# Patient Record
Sex: Female | Born: 1975 | Race: Black or African American | Hispanic: No | Marital: Single | Smoking: Former smoker
Health system: Southern US, Community
[De-identification: ages and names within clinical notes are randomized; demographics above are authoritative.]

## PROBLEM LIST (undated history)

## (undated) DIAGNOSIS — D1803 Hemangioma of intra-abdominal structures: Secondary | ICD-10-CM

## (undated) DIAGNOSIS — C250 Malignant neoplasm of head of pancreas: Secondary | ICD-10-CM

## (undated) DIAGNOSIS — K219 Gastro-esophageal reflux disease without esophagitis: Secondary | ICD-10-CM

## (undated) DIAGNOSIS — R51 Headache: Secondary | ICD-10-CM

## (undated) DIAGNOSIS — B1910 Unspecified viral hepatitis B without hepatic coma: Secondary | ICD-10-CM

## (undated) DIAGNOSIS — S82899A Other fracture of unspecified lower leg, initial encounter for closed fracture: Secondary | ICD-10-CM

## (undated) DIAGNOSIS — L309 Dermatitis, unspecified: Secondary | ICD-10-CM

## (undated) DIAGNOSIS — K869 Disease of pancreas, unspecified: Secondary | ICD-10-CM

## (undated) HISTORY — PX: ANKLE SURGERY: SHX546

## (undated) HISTORY — DX: Morbid (severe) obesity due to excess calories: E66.01

## (undated) HISTORY — PX: MOUTH SURGERY: SHX715

## (undated) HISTORY — PX: INDUCED ABORTION: SHX677

## (undated) HISTORY — DX: Headache: R51

## (undated) HISTORY — DX: Dermatitis, unspecified: L30.9

## (undated) HISTORY — PX: TUBAL LIGATION: SHX77

## (undated) HISTORY — DX: Hemangioma of intra-abdominal structures: D18.03

## (undated) HISTORY — DX: Unspecified viral hepatitis B without hepatic coma: B19.10

## (undated) HISTORY — DX: Disease of pancreas, unspecified: K86.9

## (undated) HISTORY — DX: Gastro-esophageal reflux disease without esophagitis: K21.9

---

## 1997-09-28 ENCOUNTER — Inpatient Hospital Stay (HOSPITAL_COMMUNITY): Admission: AD | Admit: 1997-09-28 | Discharge: 1997-09-28 | Payer: Self-pay | Admitting: Obstetrics & Gynecology

## 1999-04-26 ENCOUNTER — Ambulatory Visit (HOSPITAL_COMMUNITY): Admission: RE | Admit: 1999-04-26 | Discharge: 1999-04-26 | Payer: Self-pay | Admitting: *Deleted

## 1999-05-31 ENCOUNTER — Inpatient Hospital Stay (HOSPITAL_COMMUNITY): Admission: AD | Admit: 1999-05-31 | Discharge: 1999-05-31 | Payer: Self-pay | Admitting: *Deleted

## 1999-12-10 ENCOUNTER — Inpatient Hospital Stay (HOSPITAL_COMMUNITY): Admission: AD | Admit: 1999-12-10 | Discharge: 1999-12-10 | Payer: Self-pay | Admitting: Obstetrics

## 1999-12-10 ENCOUNTER — Encounter: Payer: Self-pay | Admitting: Obstetrics

## 2000-01-20 ENCOUNTER — Ambulatory Visit (HOSPITAL_COMMUNITY): Admission: RE | Admit: 2000-01-20 | Discharge: 2000-01-20 | Payer: Self-pay | Admitting: *Deleted

## 2000-05-20 ENCOUNTER — Encounter (INDEPENDENT_AMBULATORY_CARE_PROVIDER_SITE_OTHER): Payer: Self-pay | Admitting: Specialist

## 2000-05-20 ENCOUNTER — Inpatient Hospital Stay (HOSPITAL_COMMUNITY): Admission: AD | Admit: 2000-05-20 | Discharge: 2000-05-22 | Payer: Self-pay | Admitting: Obstetrics

## 2003-04-15 ENCOUNTER — Emergency Department (HOSPITAL_COMMUNITY): Admission: EM | Admit: 2003-04-15 | Discharge: 2003-04-15 | Payer: Self-pay | Admitting: Emergency Medicine

## 2003-04-19 ENCOUNTER — Emergency Department (HOSPITAL_COMMUNITY): Admission: EM | Admit: 2003-04-19 | Discharge: 2003-04-19 | Payer: Self-pay | Admitting: Emergency Medicine

## 2003-06-30 ENCOUNTER — Inpatient Hospital Stay (HOSPITAL_COMMUNITY): Admission: AD | Admit: 2003-06-30 | Discharge: 2003-06-30 | Payer: Self-pay | Admitting: *Deleted

## 2006-04-12 ENCOUNTER — Emergency Department (HOSPITAL_COMMUNITY): Admission: EM | Admit: 2006-04-12 | Discharge: 2006-04-12 | Payer: Self-pay | Admitting: Emergency Medicine

## 2006-04-16 ENCOUNTER — Emergency Department (HOSPITAL_COMMUNITY): Admission: EM | Admit: 2006-04-16 | Discharge: 2006-04-16 | Payer: Self-pay | Admitting: Emergency Medicine

## 2006-05-03 ENCOUNTER — Other Ambulatory Visit: Admission: RE | Admit: 2006-05-03 | Discharge: 2006-05-03 | Payer: Self-pay | Admitting: Family Medicine

## 2007-04-29 ENCOUNTER — Emergency Department (HOSPITAL_COMMUNITY): Admission: EM | Admit: 2007-04-29 | Discharge: 2007-04-29 | Payer: Self-pay | Admitting: Emergency Medicine

## 2007-12-01 ENCOUNTER — Emergency Department (HOSPITAL_COMMUNITY): Admission: EM | Admit: 2007-12-01 | Discharge: 2007-12-01 | Payer: Self-pay | Admitting: Emergency Medicine

## 2008-01-02 ENCOUNTER — Inpatient Hospital Stay (HOSPITAL_COMMUNITY): Admission: EM | Admit: 2008-01-02 | Discharge: 2008-01-03 | Payer: Self-pay | Admitting: Emergency Medicine

## 2009-10-04 ENCOUNTER — Inpatient Hospital Stay (HOSPITAL_COMMUNITY): Admission: AD | Admit: 2009-10-04 | Discharge: 2009-10-04 | Payer: Self-pay | Admitting: Obstetrics & Gynecology

## 2009-10-04 ENCOUNTER — Ambulatory Visit: Payer: Self-pay | Admitting: Family

## 2010-09-06 LAB — WET PREP, GENITAL
Clue Cells Wet Prep HPF POC: NONE SEEN
Yeast Wet Prep HPF POC: NONE SEEN

## 2010-09-06 LAB — POCT PREGNANCY, URINE: Preg Test, Ur: NEGATIVE

## 2010-11-01 NOTE — Op Note (Signed)
NAMEMarland Kitchen  ESTEEN, Kathryn NO.:  [id number]   MEDICAL RECORD NO.:  [id number]          PATIENT TYPE:  INP   LOCATION:  0102                         FACILITY:  [facility]   PHYSICIAN:  Vania Rea. Supple, M.D.  DATE OF BIRTH:  1975-08-18   DATE OF PROCEDURE:  01/02/2008  DATE OF DISCHARGE:                               OPERATIVE REPORT   PREOPERATIVE DIAGNOSIS:  Displaced left ankle bimalleolar fracture  dislocation.   POSTOPERATIVE DIAGNOSIS:  Displaced left ankle bimalleolar fracture  dislocation.   PROCEDURE:  Open reduction and internal fixation of left bimalleolar  ankle fracture.   SURGEON:  Vania Rea. Supple, M.D.   Threasa HeadsFrench Ana A. Shuford, P.A.-C.   ANESTHESIA:  LMA general.   TOURNIQUET TIME:  Approximately 1 hour 50 minutes.   BLOOD LOSS:  Minimal.   DRAINS:  None.   HISTORY:  Kathryn Stevenson is a 35 year old female who tripped and fell  injuring her left ankle with complaints of immediate ankle pain and  inability to bear weight.  She was evaluated in the [facility]  Emergency Room with radiographs reviewed, showing displaced bimalleolar  ankle fracture.  Patient was subsequently brought to the operating room  at this time for planned ORIF.   Preoperatively counseled [name] on treatment options as well as  risks versus benefits thereof.  Possible surgical complications of  bleeding, infection, neurovascular injury, DVT, PE, arthrofibrosis,  malunion, nonunion, loss of fixation,  possible need for additional  surgery are reviewed.  She understands and accepts and agrees with the  planned procedure.   DESCRIPTION OF PROCEDURE:  After undergoing a routine preop evaluation,  the patient received prophylactic antibiotics.  Placed supine on the  operating table and underwent smooth induction of general endotracheal  anesthesia.  Tourniquet applied to the left thigh and left leg was  sterilely prepped and draped in standard fashion.  Leg was  exsanguinated  with the tourniquet inflated to 400 mmHg.  Made a anterior hockey-stick  incision about the medial malleolus.  Dissection carried deeply with the  medial malleolar fracture site exposed and irrigated.  Inspection of the  talar dome showed significant chondral defect which was debrided.  Interposed soft tissue hematoma clot was all removed.  We then turned  our attention laterally, made a longitudinal 10 cm incision over the  distal fibula with dissection carried deeply with peroneal musculature  and tendon reflected posteriorly and subperiosteal dissection used to  expose the distal fibula about the fracture site.  Interposed soft  tissues were removed and the fracture site was irrigated and  meticulously cleaned and then reduced under direct visualization and  temporarily held with a clamp.  We then contoured a 7-hole one-third  tubular locking plate to fit over the posterolateral margin of the  distal fibula spanning the fracture site and this was then transfixed  with a standard cortical screw through the plate initially, a lag screw  across the fracture site, two locking screws distally, two locking  screws proximally which all obtained excellent bony purchase and good  alignment of the fracture site.  Fluoroscopic images were then used to  confirm good alignment of the fracture site.  We then returned our  attention medially where under direct visualization reduced the medial  malleolar fracture and past two guide wires for the 4 cannulated screws  and this, again, was confirmed fluoroscopically to be good position.  We  went ahead and drilled placed two 45 mm cannulated lag screws with  washers across the fracture site and again obtained excellent bony  purchase and good compression across the fracture site.  At this point,  fluoroscopic images were then obtained which confirmed good position of  the hardware and good alignment of the fracture sites.  The tibial   plafond was symmetrically aligned.  All wounds were then irrigated.  Closed with 2-0 Vicryl for the deep subcu and intracuticular 3-0  Monocryl for the skin, followed by Steri-Strips.  Bulky dry dressing was  then wrapped about the ankle and leg and a very well-padded short-leg  plaster stirrup.  Splint was applied with the ankle in neutral position.  The tourniquet was able to be let down.  The patient was extubated and  taken to the recovery room in stable condition.      Vania Rea. Supple, M.D.  Electronically Signed     KMS/MEDQ  D:  01/02/2008  T:  01/02/2008  Job:  [id number]

## 2010-11-04 NOTE — Op Note (Signed)
[facility]  Patient:    Kathryn Stevenson, Kathryn Stevenson                      MRN: [id number] Proc. Date: 05/21/00 Adm. Date:  [id number] Attending:  Tammi Sou Dictator:   Jamey Reas, M.D.                           Operative Report  DATE OF BIRTH:                Aug 18, 1975.  PREOPERATIVE DIAGNOSIS:       Multiparity, desires permanent sterilization.  POSTOPERATIVE DIAGNOSIS:      Multiparity, desires permanent sterilization.  OPERATION:                    Postpartum tubal ligation, Pomeroy method.  SURGEON:                      Dr. Coral Ceo.  ASSISTANT:                    Dr. Larita Fife.  ANESTHESIA:                   General.  COMPLICATIONS:                None.  ESTIMATED BLOOD LOSS:         Minimal.  FLUID:                        1400 cc.  INDICATIONS:                  [age]-year-old G4, P2-0-2-2, status post NSVD, who desires permanent sterilization.  Risks and benefits of procedure discussed with patient including risk of failure of 3-5:1000, with increased risk of ectopic gestation if pregnancy occurs.  FINDINGS:                     Normal uterus, tubes, and ovaries.  DESCRIPTION OF PROCEDURE:     The patient was taken to the operating room, where general anesthesia was introduced.  A small transverse infraumbilical skin incision was then made with the scalpel.  The incision was carried down through the underlying fascia until the peritoneum was identified and entered. The peritoneum was noted to be free of any adhesions, and the incision was then extended with Metzenbaum scissors.  The patients right fallopian tube was then identified, brought through the incision, and grasped with the Babcock clamp.  The tube was then followed out to the fimbria.  A Babcock clamp was then used to grasp the tube approximately 4 cm from the corneal region.  A 3 cm segment of the tube was then ligated with two free ties  of plain gut and excised.  Good hemostasis was noted, and the tube was returned to the abdomen.  The left fallopian tube was then ligated x 2, a 3 cm segment excised in a similar fashion.  Excellent hemostasis was noted, and the tube was returned to the abdomen.  The peritoneum and fascia were then closed with a single layer using 3-0 Vicryl.  The skin was closed with three subcutaneous Monocryl interrupted sutures to approximate the subcutaneous tissue.  The skin was then closed in a subcuticular fashion using 4-0 Monocryl stitch.  The patient tolerated the procedure well.  Sponge, lap, and needle counts were correct x 2.  The patient was taken to the recovery room in stable condition. Pathology segments were right and left fallopian tubes. DD:  05/21/00 TD:  05/21/00 Job: [id number]

## 2011-03-17 LAB — URINALYSIS, ROUTINE W REFLEX MICROSCOPIC
Bilirubin Urine: NEGATIVE
Glucose, UA: NEGATIVE
Hgb urine dipstick: NEGATIVE
Ketones, ur: NEGATIVE
Nitrite: NEGATIVE
Protein, ur: NEGATIVE
Specific Gravity, Urine: 1.006
Urobilinogen, UA: 0.2
pH: 6.5

## 2011-03-17 LAB — COMPREHENSIVE METABOLIC PANEL
ALT: 21
AST: 26
Albumin: 3.7
Alkaline Phosphatase: 75
BUN: 9
CO2: 19
Calcium: 8.8
Chloride: 109
Creatinine, Ser: 0.87
GFR calc Af Amer: >60
GFR calc non Af Amer: >60
Glucose, Bld: 89
Potassium: 4.5
Sodium: 140
Total Bilirubin: 1.1
Total Protein: 6.9

## 2011-03-17 LAB — DIFFERENTIAL
Basophils Absolute: 0
Basophils Relative: 0
Eosinophils Absolute: 0
Eosinophils Relative: 0
Lymphocytes Relative: 24
Lymphs Abs: 2.8
Monocytes Absolute: 0.4
Monocytes Relative: 4
Neutro Abs: 8.2 — ABNORMAL HIGH
Neutrophils Relative %: 72

## 2011-03-17 LAB — CBC
HCT: 39.8
Hemoglobin: 14
MCHC: 35.2
MCV: 90.6
Platelets: 275
RBC: 4.4
RDW: 13.6
WBC: 11.5 — ABNORMAL HIGH

## 2011-03-17 LAB — PREGNANCY, URINE: Preg Test, Ur: NEGATIVE

## 2011-10-18 ENCOUNTER — Inpatient Hospital Stay (HOSPITAL_COMMUNITY)
Admission: AD | Admit: 2011-10-18 | Discharge: 2011-10-18 | Disposition: A | Discharge status: Home / Self Care | Payer: Medicaid Other | Source: Ambulatory Visit | Attending: Obstetrics and Gynecology | Admitting: Obstetrics and Gynecology

## 2011-10-18 DIAGNOSIS — M549 Dorsalgia, unspecified: Secondary | ICD-10-CM

## 2011-10-18 DIAGNOSIS — L259 Unspecified contact dermatitis, unspecified cause: Secondary | ICD-10-CM | POA: Insufficient documentation

## 2011-10-18 LAB — URINALYSIS, ROUTINE W REFLEX MICROSCOPIC
Bilirubin Urine: NEGATIVE
Glucose, UA: NEGATIVE mg/dL
Ketones, ur: NEGATIVE mg/dL
Nitrite: NEGATIVE
Protein, ur: NEGATIVE mg/dL
Specific Gravity, Urine: 1.015 (ref 1.005–1.030)
Urobilinogen, UA: 0.2 mg/dL (ref 0.0–1.0)
pH: 6 (ref 5.0–8.0)

## 2011-10-18 LAB — URINE MICROSCOPIC-ADD ON

## 2011-10-18 LAB — POCT PREGNANCY, URINE
Preg Test, Ur: NEGATIVE
Preg Test, Ur: NEGATIVE

## 2011-10-18 MED ORDER — BACLOFEN 10 MG PO TABS: 10.0000 mg | ORAL_TABLET | Freq: Three times a day (TID) | ORAL | Status: DC

## 2011-10-18 NOTE — MAU Note (Signed)
C/o R sided back pain for app one month, slightly worse with lying down. Also c/o sudden onset "spots" on trunk and extremities. These bumps are hive-like in appearance and sparsel on arms and legs. C/o very itchy and mild relief from hydrocortisone cream.

## 2011-10-18 NOTE — Discharge Instructions (Signed)
Back Exercises Back exercises help treat and prevent back injuries. The goal is to increase your strength in your belly (abdominal) and back muscles. These exercises can also help with flexibility. Start these exercises when told by your doctor. HOME CARE Back exercises include: Pelvic Tilt.  Lie on your back with your knees bent. Tilt your pelvis until the lower part of your back is against the floor. Hold this position 5 to 10 sec. Repeat this exercise 5 to 10 times.  Knee to Chest.  Pull 1 knee up against your chest and hold for 20 to 30 seconds. Repeat this with the other knee. This may be done with the other leg straight or bent, whichever feels better. Then, pull both knees up against your chest.  Sit-Ups or Curl-Ups.  Bend your knees 90 degrees. Start with tilting your pelvis, and do a partial, slow sit-up. Only lift your upper half 30 to 45 degrees off the floor. Take at least 2 to 3 seonds for each sit-up. Do not do sit-ups with your knees out straight. If partial sit-ups are difficult, simply do the above but with only tightening your belly (abdominal) muscles and holding it as told.  Hip-Lift.  Lie on your back with your knees flexed 90 degrees. Push down with your feet and shoulders as you raise your hips 2 inches off the floor. Hold for 10 seconds, repeat 5 to 10 times.  Back Arches.  Lie on your stomach. Prop yourself up on bent elbows. Slowly press on your hands, causing an arch in your low back. Repeat 3 to 5 times.  Shoulder-Lifts.  Lie face down with arms beside your body. Keep hips and belly pressed to floor as you slowly lift your head and shoulders off the floor.  Do not overdo your exercises. Be careful in the beginning. Exercises may cause you some mild back discomfort. If the pain lasts for more than 15 minutes, stop the exercises until you see your doctor. Improvement with exercise for back problems is slow.  Document Released: 07/08/2010 Document Revised: 05/25/2011  Document Reviewed: 07/08/2010 [employer] Patient Information 2012 [employer]. 

## 2011-10-18 NOTE — MAU Provider Note (Signed)
  History     CSN: [id number]  Arrival date and time: 10/18/11 2049   None     Chief Complaint  Patient presents with  . Rash  . Back Pain   HPI This is a 36 year old female seen for 3 days of diffuse red raised pruritic areas all over skin.  Woke up Monday morning with these lesions after hanging sheets out on line the day before.  Has been taking benadryl with relief of symptoms, although itching comes back after benadryl wears off.  No changes in soaps, lotions, foods.  Also having back pain that has been worsening over past 1 month.  Worse when laying in bed, especially when wakes up in morning.  No accidents, falls, strains.  OB History    No data available      No past medical history on file.  No past surgical history on file.  No family history on file.  History  Substance Use Topics  . Smoking status: Not on file  . Smokeless tobacco: Not on file  . Alcohol Use: Not on file    Allergies: No Known Allergies  Prescriptions prior to admission  Medication Sig Dispense Refill  . b complex vitamins tablet Take 1 tablet by mouth daily.      . diphenhydrAMINE (BENADRYL) 25 MG tablet Take 25 mg by mouth every 6 (six) hours as needed. For hives        ROS Physical Exam   Blood pressure 124/74, pulse 80, temperature 97.8 F (36.6 C), temperature source Oral, resp. rate 20, height 5\' 6"  (1.676 m), weight 143.972 kg (317 lb 6.4 oz), last menstrual period 10/05/2011.  Physical Exam  Constitutional: She is oriented to person, place, and time. She appears well-developed and well-nourished.  GI: Soft. Bowel sounds are normal. She exhibits no distension and no mass. There is no tenderness. There is no rebound and no guarding.  Musculoskeletal:       Right QL spasm.  Neurological: She is alert and oriented to person, place, and time.       LE muscles 5/5.    Skin: Skin is warm and dry.       Scattered hives throughout.  Psychiatric: She has a normal mood and affect. Her  behavior is normal. Judgment and thought content normal.    MAU Course  Procedures   Assessment and Plan  1.  Contact dermatitis.  Encouraged patient to wash and dry sheet again.  Likely from sheets.  Continue benadryl q6hrs prn. 2.  Back pain - baclofen.  Elleigh Cassetta JEHIEL 10/18/2011, 9:56 PM

## 2011-10-18 NOTE — MAU Note (Signed)
Pt LMP 10/05/2011, having lower back pain x 1.57months.  Denies vag bleeding or discharge.  Noticed small itchy bumps on arms, abd and legs Monday morning.

## 2011-11-12 ENCOUNTER — Encounter (HOSPITAL_COMMUNITY): Payer: Self-pay | Admitting: *Deleted

## 2011-11-12 ENCOUNTER — Emergency Department (HOSPITAL_COMMUNITY)
Admission: EM | Admit: 2011-11-12 | Discharge: 2011-11-12 | Disposition: A | Discharge status: Home / Self Care | Payer: Medicaid Other | Attending: Emergency Medicine | Admitting: Emergency Medicine

## 2011-11-12 DIAGNOSIS — L0390 Cellulitis, unspecified: Secondary | ICD-10-CM

## 2011-11-12 DIAGNOSIS — IMO0002 Reserved for concepts with insufficient information to code with codable children: Secondary | ICD-10-CM | POA: Insufficient documentation

## 2011-11-12 DIAGNOSIS — L0291 Cutaneous abscess, unspecified: Secondary | ICD-10-CM

## 2011-11-12 MED ORDER — OXYCODONE-ACETAMINOPHEN 5-325 MG PO TABS
1.0000 | ORAL_TABLET | Freq: Once | ORAL | Status: AC
  Administered 2011-11-12: 1 via ORAL
  Filled 2011-11-12: qty 1

## 2011-11-12 MED ORDER — OXYCODONE-ACETAMINOPHEN 5-325 MG PO TABS: 1.0000 | ORAL_TABLET | ORAL | Status: AC | PRN

## 2011-11-12 MED ORDER — LIDOCAINE HCL 2 % IJ SOLN
10.0000 mL | Freq: Once | INTRAMUSCULAR | Status: AC
  Administered 2011-11-12: 20 mg via INTRADERMAL
  Filled 2011-11-12: qty 1

## 2011-11-12 MED ORDER — SULFAMETHOXAZOLE-TRIMETHOPRIM 800-160 MG PO TABS: 1.0000 | ORAL_TABLET | Freq: Two times a day (BID) | ORAL | Status: AC

## 2011-11-12 MED ORDER — IBUPROFEN 800 MG PO TABS
800.0000 mg | ORAL_TABLET | Freq: Once | ORAL | Status: AC
  Administered 2011-11-12: 800 mg via ORAL
  Filled 2011-11-12: qty 1

## 2011-11-12 NOTE — ED Notes (Signed)
Reports abscess started week ago progressively worsening & spreading. Denies drainage. States has had it "lanced" approx 2 yrs ago.

## 2011-11-12 NOTE — Discharge Instructions (Signed)
Kathryn Stevenson we drained an abscess to your L axilla.  Return in 2 days for packing removal and recheck.  Start the antibiotics today.  Use hot compresses and squeeze 3 times a day.  Take ibuprofen 800mg  every 6 hours with food.  Take the percocet for severe pain but do not drive with this medication.  Return to the ER for severe pain, fever nausea and vomiting.    Abscess An abscess (boil or furuncle) is an infected area under your skin. This area is filled with yellowish white fluid (pus). HOME CARE   Only take medicine as told by your doctor.   Keep the skin clean around your abscess. Keep clothes that may touch the abscess clean.   Change any bandages (dressings) as told by your doctor.   Avoid direct skin contact with other people. The infection can spread by skin contact with others.   Practice good hygiene and do not share personal care items.   Do not share athletic equipment, towels, or whirlpools. Shower after every practice or work out session.   If a draining area cannot be covered:   Do not play sports.   Children should not go to daycare until the wound has healed or until fluid (drainage) stops coming out of the wound.   See your doctor for a follow-up visit as told.  GET HELP RIGHT AWAY IF:   There is more pain, puffiness (swelling), and redness in the wound site.   There is fluid or bleeding from the wound site.   You have muscle aches, chills, fever, or feel sick.   You or your child has a temperature by mouth above 102 F (38.9 C), not controlled by medicine.   Your baby is older than 3 months with a rectal temperature of 102 F (38.9 C) or higher.  MAKE SURE YOU:   Understand these instructions.   Will watch your condition.   Will get help right away if you are not doing well or get worse.  Document Released: 11/22/2007 Document Revised: 05/25/2011 Document Reviewed: 11/22/2007 [facility] [employer] Patient Information 2012 [place], [place].Cellulitis Cellulitis is an  infection of the tissue under the skin. The infected area is usually red and tender. This is caused by germs. These germs enter the body through cuts or sores. This usually happens in the arms or lower legs. HOME CARE   Take your medicine as told. Finish it even if you start to feel better.   If the infection is on the arm or leg, keep it raised (elevated).   Use a warm cloth on the infected area several times a day.   See your doctor for a follow-up visit as told.  GET HELP RIGHT AWAY IF:   You are tired or confused.   You throw up (vomit).   You have watery poop (diarrhea).   You feel ill and have muscle aches.   You have a fever.  MAKE SURE YOU:   Understand these instructions.   Will watch your condition.   Will get help right away if you are not doing well or get worse.  Document Released: 11/22/2007 Document Revised: 05/25/2011 Document Reviewed: 05/07/2009 [facility] Patient Information 2012 [place], [place].

## 2011-11-12 NOTE — ED Notes (Signed)
Pt has abscess under left arm. No other complaints.

## 2011-11-12 NOTE — ED Provider Notes (Signed)
History     CSN: [id number]  Arrival date & time 11/12/11  1052   First MD Initiated Contact with Patient 11/12/11 1103      Chief Complaint  Patient presents with  . Abscess    (Consider location/radiation/quality/duration/timing/severity/associated sxs/prior treatment) Patient is a 36 y.o. female presenting with abscess. The history is provided by the patient. No language interpreter was used.  Abscess  This is a recurrent problem. The current episode started less than one week ago. The problem occurs frequently. The problem has been unchanged. Affected Location: L axilla. The problem is moderate. The abscess is characterized by redness, painfulness and swelling. Pertinent negatives include no fever and no vomiting.  Recurrent Abscess to L axillary.  Surrounding cellulitis.   History reviewed. No pertinent past medical history.  History reviewed. No pertinent past surgical history.  History reviewed. No pertinent family history.  History  Substance Use Topics  . Smoking status: Current Everyday Smoker    Types: Cigarettes  . Smokeless tobacco: Not on file  . Alcohol Use: No    OB History    Grav Para Term Preterm Abortions TAB SAB Ect Mult Living                  Review of Systems  Constitutional: Negative for fever and diaphoresis.  Respiratory: Negative for shortness of breath.   Gastrointestinal: Negative for nausea and vomiting.  Skin:       L axilla abscess  Neurological: Negative for dizziness, weakness and light-headedness.    Allergies  Review of patient's allergies indicates no known allergies.  Home Medications   Current Outpatient Rx  Name Route Sig Dispense Refill  . IBUPROFEN 200 MG PO TABS Oral Take 200-400 mg by mouth every 6 (six) hours as needed. For menstrual cramps    . BACLOFEN 10 MG PO TABS Oral Take 1 tablet (10 mg total) by mouth 3 (three) times daily. 30 each 0    BP 139/84  Pulse 91  Temp 98.3 F (36.8 C)  Resp 20  SpO2 99%   LMP 11/09/2011  Physical Exam  Constitutional: She is oriented to person, place, and time. She appears well-developed and well-nourished. No distress.  HENT:  Head: Normocephalic.  Eyes: Pupils are equal, round, and reactive to light.  Neck: Normal range of motion.  Cardiovascular: Normal rate.   Pulmonary/Chest: Effort normal.  Neurological: She is alert and oriented to person, place, and time.  Skin: Skin is warm and dry.       6cm L axilla abscess with surrounding cellulitis  Psychiatric: She has a normal mood and affect.    ED Course  INCISION AND DRAINAGE Date/Time: 11/12/2011 11:15 AM Performed by: Remi Haggard Authorized by: Remi Haggard Consent: Verbal consent obtained. Written consent not obtained. Risks and benefits: risks, benefits and alternatives were discussed Consent given by: patient Patient understanding: patient states understanding of the procedure being performed Patient identity confirmed: verbally with patient, arm band, provided demographic data and hospital-assigned identification number Type: abscess Body area: trunk (L axilla) Anesthesia: local infiltration Local anesthetic: lidocaine 2% without epinephrine Patient sedated: no Scalpel size: 11 Needle gauge: 22 Incision type: single straight Complexity: simple Drainage: purulent and serosanguinous Drainage amount: copious Wound treatment: wound left open Packing material: 1/4 in iodoform gauze Patient tolerance: Patient tolerated the procedure well with no immediate complications.   (including critical care time)  Labs Reviewed - No data to display No results found.   No diagnosis found.  MDM  I & D L axillary abscess with cellulitis.  Pt put on Bactrim DS.  Return in 2 days for recheck.          Remi Haggard, NP 11/13/11 [phone]

## 2011-11-12 NOTE — Progress Notes (Signed)
Cm spoke with PA concerning pt's need for medication assistance. Pt eligible for indigent funds. Rx sent to pharmacy. Pt states visits [facility] for primary care.   Leonie Green [phone]

## 2011-11-14 ENCOUNTER — Emergency Department (HOSPITAL_COMMUNITY)
Admission: EM | Admit: 2011-11-14 | Discharge: 2011-11-14 | Disposition: A | Discharge status: Home / Self Care | Payer: Medicaid Other | Attending: Emergency Medicine | Admitting: Emergency Medicine

## 2011-11-14 ENCOUNTER — Encounter (HOSPITAL_COMMUNITY): Payer: Self-pay | Admitting: *Deleted

## 2011-11-14 DIAGNOSIS — Z5189 Encounter for other specified aftercare: Secondary | ICD-10-CM

## 2011-11-14 DIAGNOSIS — F17200 Nicotine dependence, unspecified, uncomplicated: Secondary | ICD-10-CM | POA: Insufficient documentation

## 2011-11-14 DIAGNOSIS — IMO0002 Reserved for concepts with insufficient information to code with codable children: Secondary | ICD-10-CM | POA: Insufficient documentation

## 2011-11-14 DIAGNOSIS — Z4800 Encounter for change or removal of nonsurgical wound dressing: Secondary | ICD-10-CM | POA: Insufficient documentation

## 2011-11-14 NOTE — ED Provider Notes (Signed)
History  Scribed for Suzi Roots, MD, the patient was seen in room STRE3/STRE3. This chart was scribed by Candelaria Stagers. The patient's care started at 11:43 AM    CSN: [id number]  Arrival date & time 11/14/11  1117   None     Chief Complaint  Patient presents with  . Wound Check    packing removal     HPI Kathryn Stevenson is a 36 y.o. female who presents to the Emergency Department for f/u after having an abscess of the left axilla drained and packed in the ED three days ago.  Pt denies fever, redness, numbness, nausea or vomiting and states that that the abscess has improved.      No past medical history on file.  No past surgical history on file.  No family history on file.  History  Substance Use Topics  . Smoking status: Current Everyday Smoker    Types: Cigarettes  . Smokeless tobacco: Not on file  . Alcohol Use: No    OB History    Grav Para Term Preterm Abortions TAB SAB Ect Mult Living                  Review of Systems  Gastrointestinal: Negative for nausea, vomiting and diarrhea.  Skin: Negative for color change.       Abscess of left axilla with packing in place    Allergies  Review of patient's allergies indicates no known allergies.  Home Medications   Current Outpatient Rx  Name Route Sig Dispense Refill  . BACLOFEN 10 MG PO TABS Oral Take 1 tablet (10 mg total) by mouth 3 (three) times daily. 30 each 0  . IBUPROFEN 200 MG PO TABS Oral Take 200-400 mg by mouth every 6 (six) hours as needed. For menstrual cramps    . OXYCODONE-ACETAMINOPHEN 5-325 MG PO TABS Oral Take 1 tablet by mouth every 4 (four) hours as needed for pain (1 or 2 for pain every 4 hours). 10 tablet 0  . SULFAMETHOXAZOLE-TRIMETHOPRIM 800-160 MG PO TABS Oral Take 1 tablet by mouth every 12 (twelve) hours. 10 tablet 0    BP 114/76  Temp(Src) 98.6 F (37 C) (Oral)  Resp 18  SpO2 100%  LMP 11/09/2011  Physical Exam  Nursing note and vitals reviewed. Constitutional: She  is oriented to person, place, and time. She appears well-developed and well-nourished.  HENT:  Head: Normocephalic and atraumatic.  Eyes: EOM are normal. Right eye exhibits no discharge. Left eye exhibits no discharge.  Neurological: She is alert and oriented to person, place, and time.  Skin:       Abscess with packing in place after being drained left axilla.   Psychiatric: She has a normal mood and affect. Her behavior is normal.    ED Course  Procedures   DIAGNOSTIC STUDIES: Oxygen Saturation is 100% on room air, normal by my interpretation.    COORDINATION OF CARE:  11:41 AM Packing removed from abscess under the left arm.        MDM  Healing abscess, no cellulitis. Packing removed.   I personally performed the services described in this documentation, which was scribed in my presence. The recorded information has been reviewed and considered. Suzi Roots, MD        Suzi Roots, MD 11/14/11 [phone]

## 2011-11-14 NOTE — ED Notes (Signed)
Patient here today for packing removal of wound in left armpit

## 2011-11-14 NOTE — Discharge Instructions (Signed)
Keep wound very clean.  Return if spreading redness, increased swelling, severe pain, fevers, other concern.     Abscess An abscess (boil or furuncle) is an infected area that contains a collection of pus.  SYMPTOMS Signs and symptoms of an abscess include pain, tenderness, redness, or hardness. You may feel a moveable soft area under your skin. An abscess can occur anywhere in the body.  TREATMENT  A surgical cut (incision) may be made over your abscess to drain the pus. Gauze may be packed into the space or a drain may be looped through the abscess cavity (pocket). This provides a drain that will allow the cavity to heal from the inside outwards. The abscess may be painful for a few days, but should feel much better if it was drained.  Your abscess, if seen early, may not have localized and may not have been drained. If not, another appointment may be required if it does not get better on its own or with medications. HOME CARE INSTRUCTIONS   Only take over-the-counter or prescription medicines for pain, discomfort, or fever as directed by your caregiver.   Take your antibiotics as directed if they were prescribed. Finish them even if you start to feel better.   Keep the skin and clothes clean around your abscess.   If the abscess was drained, you will need to use gauze dressing to collect any draining pus. Dressings will typically need to be changed 3 or more times a day.   The infection may spread by skin contact with others. Avoid skin contact as much as possible.   Practice good hygiene. This includes regular hand washing, cover any draining skin lesions, and do not share personal care items.   If you participate in sports, do not share athletic equipment, towels, whirlpools, or personal care items. Shower after every practice or tournament.   If a draining area cannot be adequately covered:   Do not participate in sports.   Children should not participate in day care until the  wound has healed or drainage stops.   If your caregiver has given you a follow-up appointment, it is very important to keep that appointment. Not keeping the appointment could result in a much worse infection, chronic or permanent injury, pain, and disability. If there is any problem keeping the appointment, you must call back to this facility for assistance.  SEEK MEDICAL CARE IF:   You develop increased pain, swelling, redness, drainage, or bleeding in the wound site.   You develop signs of generalized infection including muscle aches, chills, fever, or a general ill feeling.   You have an oral temperature above 102 F (38.9 C).  MAKE SURE YOU:   Understand these instructions.   Will watch your condition.   Will get help right away if you are not doing well or get worse.  Document Released: 03/15/2005 Document Revised: 05/25/2011 Document Reviewed: 01/07/2008 [facility] Patient Information 2012 [place], [place].

## 2011-11-16 NOTE — ED Provider Notes (Signed)
Medical screening examination/treatment/procedure(s) were performed by non-physician practitioner and as supervising physician I was immediately available for consultation/collaboration.   Suzi Roots, MD 11/16/11 1013

## 2012-01-24 ENCOUNTER — Encounter (HOSPITAL_COMMUNITY): Payer: Self-pay | Admitting: Emergency Medicine

## 2012-01-24 ENCOUNTER — Emergency Department (HOSPITAL_COMMUNITY)
Admission: EM | Admit: 2012-01-24 | Discharge: 2012-01-24 | Disposition: A | Discharge status: Home / Self Care | Payer: Medicaid Other | Attending: Emergency Medicine | Admitting: Emergency Medicine

## 2012-01-24 DIAGNOSIS — R109 Unspecified abdominal pain: Secondary | ICD-10-CM | POA: Insufficient documentation

## 2012-01-24 DIAGNOSIS — F17200 Nicotine dependence, unspecified, uncomplicated: Secondary | ICD-10-CM | POA: Insufficient documentation

## 2012-01-24 DIAGNOSIS — F1210 Cannabis abuse, uncomplicated: Secondary | ICD-10-CM | POA: Insufficient documentation

## 2012-01-24 DIAGNOSIS — R079 Chest pain, unspecified: Secondary | ICD-10-CM | POA: Insufficient documentation

## 2012-01-24 LAB — CBC
HCT: 38.8 % (ref 36.0–46.0)
Hemoglobin: 14 g/dL (ref 12.0–15.0)
MCH: 30.6 pg (ref 26.0–34.0)
MCHC: 36.1 g/dL — ABNORMAL HIGH (ref 30.0–36.0)
MCV: 84.9 fL (ref 78.0–100.0)
Platelets: 232 10*3/uL (ref 150–400)
RBC: 4.57 MIL/uL (ref 3.87–5.11)
RDW: 15 % (ref 11.5–15.5)
WBC: 6.8 10*3/uL (ref 4.0–10.5)

## 2012-01-24 LAB — LIPASE, BLOOD: Lipase: 24 U/L (ref 11–59)

## 2012-01-24 LAB — POCT PREGNANCY, URINE: Preg Test, Ur: NEGATIVE

## 2012-01-24 LAB — COMPREHENSIVE METABOLIC PANEL
ALT: 17 U/L (ref 0–35)
AST: 21 U/L (ref 0–37)
Albumin: 3.5 g/dL (ref 3.5–5.2)
Alkaline Phosphatase: 72 U/L (ref 39–117)
BUN: 9 mg/dL (ref 6–23)
CO2: 21 mEq/L (ref 19–32)
Calcium: 9 mg/dL (ref 8.4–10.5)
Chloride: 102 mEq/L (ref 96–112)
Creatinine, Ser: 0.77 mg/dL (ref 0.50–1.10)
GFR calc Af Amer: >90 mL/min (ref >90)
GFR calc non Af Amer: >90 mL/min (ref >90)
Glucose, Bld: 109 mg/dL — ABNORMAL HIGH (ref 70–99)
Potassium: 4 mEq/L (ref 3.5–5.1)
Sodium: 134 mEq/L — ABNORMAL LOW (ref 135–145)
Total Bilirubin: 0.3 mg/dL (ref 0.3–1.2)
Total Protein: 7.3 g/dL (ref 6.0–8.3)

## 2012-01-24 MED ORDER — GI COCKTAIL ~~LOC~~
30.0000 mL | Freq: Once | ORAL | Status: AC
  Administered 2012-01-24: 30 mL via ORAL
  Filled 2012-01-24: qty 30

## 2012-01-24 NOTE — ED Provider Notes (Addendum)
History     CSN: [id number]  Arrival date & time 01/24/12  0815   First MD Initiated Contact with Patient 01/24/12 [phone]      Chief Complaint  Patient presents with  . Abdominal Pain  . Chest Pain    (Consider location/radiation/quality/duration/timing/severity/associated sxs/prior treatment) HPI Complains of epigastric pain, radiating to back onset 4 days ago. Pain is intermittent lasting for minutes at a time. Worse with lying supine and with food. Improved with standing up. No fever. Last bowel movement yesterday normal. Last menstrual period July 15. Associated symptoms include vomiting 1 episode yesterday, yellow material. No nausea now. Treated with ibuprofen and Zantac without relief. Back pain is worse with changing position improved with remaining still History reviewed. No pertinent past medical history. Past medical history negative History reviewed. No pertinent past surgical history. Surgical history tubal ligation, ORIF left ankle History reviewed. No pertinent family history.  History  Substance Use Topics  . Smoking status: Current Everyday Smoker    Types: Cigarettes  . Smokeless tobacco: Not on file  . Alcohol Use: Yes   social history positive smoker positive marijuana use positive alcohol. Last used marijuana and alcohol 4 days ago  OB History    Grav Para Term Preterm Abortions TAB SAB Ect Mult Living                  Review of Systems  Constitutional: Negative.   HENT: Negative.   Respiratory: Negative.   Cardiovascular: Negative.   Gastrointestinal: Positive for vomiting and abdominal pain.  Musculoskeletal: Positive for back pain.  Skin: Negative.   Neurological: Negative.   Hematological: Negative.   Psychiatric/Behavioral: Negative.     Allergies  Review of patient's allergies indicates no known allergies.  Home Medications   Current Outpatient Rx  Name Route Sig Dispense Refill  . IBUPROFEN 200 MG PO TABS Oral Take 200-400 mg by mouth  every 6 (six) hours as needed. For menstrual cramps and pain    . RANITIDINE HCL 150 MG PO TABS Oral Take 150 mg by mouth daily.      BP 122/83  Pulse 64  Temp 98.8 F (37.1 C) (Oral)  Resp 20  SpO2 99%  Physical Exam  Nursing note and vitals reviewed. Constitutional: She appears well-developed and well-nourished.  HENT:  Head: Normocephalic and atraumatic.  Eyes: Conjunctivae are normal. Pupils are equal, round, and reactive to light.  Neck: Neck supple. No tracheal deviation present. No thyromegaly present.  Cardiovascular: Normal rate and regular rhythm.   No murmur heard. Pulmonary/Chest: Effort normal and breath sounds normal.  Abdominal: Soft. Bowel sounds are normal. She exhibits no distension. There is tenderness.       Morbidly obese minimally tender epigastrium. No right upper quadrant tenderness no right lower quadrant tenderness negative Murphy sign  Musculoskeletal: Normal range of motion. She exhibits no edema and no tenderness.       Entire spine nontender  Neurological: She is alert. Coordination normal.  Skin: Skin is warm and dry. No rash noted.  Psychiatric: She has a normal mood and affect.    ED Course  Procedures (including critical care time)   Labs Reviewed  COMPREHENSIVE METABOLIC PANEL  LIPASE, BLOOD  CBC   No results found.   Date: 02/27/2012  Rate: 65  Rhythm: normal sinus rhythm  QRS Axis: normal  Intervals: normal  ST/T Wave abnormalities: normal  Conduction Disutrbances: none  Narrative Interpretation: unremarkable    No diagnosis found. 10:35 AM feels  much improved after treatment with GI cocktail  MDM  Strongly doubt cardiac etiology of symptoms. No EKG evidence of pericarditis no rubs no murmurs Differential diagnosis Symptoms consistent of gastritis or GERD. Patient encouraged to stop smoking, avoid alcohol. Numbers for resources guide provided Plan elevate head of bed Stop ibuprofen. Tylenol for back pain. Continue Zantac  as directed. Maalox 2 tablespoons after meals and at bedtime as needed for abdominal discomfort         Doug Sou, MD 01/24/12 1039  Doug Sou, MD 02/27/12 1610

## 2012-01-24 NOTE — ED Notes (Signed)
MD at bedside. 

## 2012-01-24 NOTE — ED Notes (Signed)
Pt c/o epigastric pain x 3 days that is worse after eating and when laying flat

## 2012-01-24 NOTE — ED Notes (Signed)
Discharged home with written and verbal instructions.  No questions or concerns at discharge. 

## 2012-03-04 ENCOUNTER — Inpatient Hospital Stay (HOSPITAL_COMMUNITY)
Admission: AD | Admit: 2012-03-04 | Discharge: 2012-03-04 | Disposition: A | Discharge status: Home / Self Care | Payer: Medicaid Other | Source: Ambulatory Visit | Attending: Family Medicine | Admitting: Family Medicine

## 2012-03-04 ENCOUNTER — Encounter (HOSPITAL_COMMUNITY): Payer: Self-pay | Admitting: *Deleted

## 2012-03-04 DIAGNOSIS — R300 Dysuria: Secondary | ICD-10-CM | POA: Insufficient documentation

## 2012-03-04 DIAGNOSIS — A5901 Trichomonal vulvovaginitis: Secondary | ICD-10-CM

## 2012-03-04 LAB — URINE MICROSCOPIC-ADD ON

## 2012-03-04 LAB — URINALYSIS, ROUTINE W REFLEX MICROSCOPIC
Bilirubin Urine: NEGATIVE
Glucose, UA: NEGATIVE mg/dL
Ketones, ur: NEGATIVE mg/dL
Nitrite: NEGATIVE
Protein, ur: NEGATIVE mg/dL
Specific Gravity, Urine: >1.03 — ABNORMAL HIGH (ref 1.005–1.030)
Urobilinogen, UA: 0.2 mg/dL (ref 0.0–1.0)
pH: 5.5 (ref 5.0–8.0)

## 2012-03-04 LAB — WET PREP, GENITAL
Clue Cells Wet Prep HPF POC: NONE SEEN
Yeast Wet Prep HPF POC: NONE SEEN

## 2012-03-04 MED ORDER — METRONIDAZOLE 500 MG PO TABS: 500.0000 mg | ORAL_TABLET | Freq: Two times a day (BID) | ORAL | Status: DC

## 2012-03-04 NOTE — MAU Provider Note (Signed)
Chart reviewed and agree with management and plan.  

## 2012-03-04 NOTE — MAU Provider Note (Signed)
History     CSN: [id number]  Arrival date and time: 03/04/12 4098   First Provider Initiated Contact with Patient 03/04/12 [phone]      Chief Complaint  Patient presents with  . Dysuria   HPI  Kathryn Stevenson is 36 y.o. [id number] presents with UTI sxs of dysuria, frequency and hematuria X 4 days.  She denies fever/chills.  She states she has recently gotten Medicaid and wants a pelvic exam and screen for STDs.   Hx of BTL.   Past Medical History  Diagnosis Date  . No pertinent past medical history     Past Surgical History  Procedure Date  . Tubal ligation   . Ankle surgery   . Induced abortion     Family History  Problem Relation Age of Onset  . Other Neg Hx     History  Substance Use Topics  . Smoking status: Current Every Day Smoker -- 0.5 packs/day    Types: Cigarettes  . Smokeless tobacco: Not on file  . Alcohol Use: Yes    Allergies: No Known Allergies  No prescriptions prior to admission    Review of Systems  Constitutional: Negative for fever and chills.  HENT: Negative.   Respiratory: Negative.   Cardiovascular: Negative.   Gastrointestinal: Positive for abdominal pain (suprapubic pain).  Genitourinary: Positive for dysuria, frequency and hematuria. Negative for flank pain.       Neg for abnormal vaginal discharge--just of menstrual cycle   Physical Exam   Blood pressure 132/85, pulse 84, temperature 98.6 F (37 C), temperature source Oral, resp. rate 20, height 5\' 6"  (1.676 m), weight 136.079 kg (300 lb), last menstrual period 02/25/2012.  Physical Exam  Constitutional: She is oriented to person, place, and time. She appears well-developed and well-nourished. No distress.  HENT:  Head: Normocephalic.  Neck: Normal range of motion.  Cardiovascular: Normal rate.   Respiratory: Effort normal.  GI: Soft. She exhibits no distension and no mass. There is tenderness (suprapubic discomfort ). There is no rebound and no guarding.  Genitourinary:  Uterus is not enlarged and not tender. Cervix exhibits no motion tenderness, no discharge and no friability. Right adnexum displays no mass, no tenderness and no fullness. Left adnexum displays no mass, no tenderness and no fullness. No bleeding around the vagina. Vaginal discharge (small amount of white discharge with slight odor) found.  Neurological: She is alert and oriented to person, place, and time.  Skin: Skin is warm and dry.  Psychiatric: She has a normal mood and affect. Her behavior is normal.    Results for orders placed during the hospital encounter of 03/04/12 (from the past 24 hour(s))  URINALYSIS, ROUTINE W REFLEX MICROSCOPIC     Status: Abnormal   Collection Time   03/04/12  9:20 AM      Component Value Range   Color, Urine YELLOW  YELLOW   APPearance CLEAR  CLEAR   Specific Gravity, Urine >1.030 (*) 1.005 - 1.030   pH 5.5  5.0 - 8.0   Glucose, UA NEGATIVE  NEGATIVE mg/dL   Hgb urine dipstick SMALL (*) NEGATIVE   Bilirubin Urine NEGATIVE  NEGATIVE   Ketones, ur NEGATIVE  NEGATIVE mg/dL   Protein, ur NEGATIVE  NEGATIVE mg/dL   Urobilinogen, UA 0.2  0.0 - 1.0 mg/dL   Nitrite NEGATIVE  NEGATIVE   Leukocytes, UA TRACE (*) NEGATIVE  URINE MICROSCOPIC-ADD ON     Status: Abnormal   Collection Time   03/04/12  9:20 AM  Component Value Range   Squamous Epithelial / LPF MANY (*) RARE   WBC, UA 3-6  <3 WBC/hpf   RBC / HPF 3-6  <3 RBC/hpf   Bacteria, UA RARE  RARE  WET PREP, GENITAL     Status: Abnormal   Collection Time   03/04/12  9:55 AM      Component Value Range   Yeast Wet Prep HPF POC NONE SEEN  NONE SEEN   Trich, Wet Prep MODERATE (*) NONE SEEN   Clue Cells Wet Prep HPF POC NONE SEEN  NONE SEEN   WBC, Wet Prep HPF POC FEW (*) NONE SEEN   MAU Course  Procedures  GC/CHL culture to lab  MDM   Assessment and Plan  A:  Trichomonas Vaginosis  P:  Discussed with the patient options of treatment now or week long treatment.  She states she has a "weak" stomach  so we will treat with Flagyl 500mg  bid X 1 week     Instructed patient to have her partner treated at the [facility] or his doctor.     Avoid alcohol and intercourse until treatment completed.   Cultures pending  KEY,EVE M 03/04/2012, 9:56 AM

## 2012-03-04 NOTE — MAU Note (Signed)
C/o painful urination with blood; concerned about STDs;

## 2012-03-05 LAB — GC/CHLAMYDIA PROBE AMP, GENITAL
Chlamydia, DNA Probe: NEGATIVE
GC Probe Amp, Genital: NEGATIVE

## 2012-09-09 ENCOUNTER — Emergency Department (HOSPITAL_COMMUNITY)
Admission: EM | Admit: 2012-09-09 | Discharge: 2012-09-09 | Disposition: A | Discharge status: Home / Self Care | Payer: Medicaid Other | Attending: Emergency Medicine | Admitting: Emergency Medicine

## 2012-09-09 ENCOUNTER — Encounter (HOSPITAL_COMMUNITY): Payer: Self-pay | Admitting: Emergency Medicine

## 2012-09-09 ENCOUNTER — Emergency Department (HOSPITAL_COMMUNITY): Payer: Medicaid Other

## 2012-09-09 DIAGNOSIS — Z79899 Other long term (current) drug therapy: Secondary | ICD-10-CM | POA: Insufficient documentation

## 2012-09-09 DIAGNOSIS — F17200 Nicotine dependence, unspecified, uncomplicated: Secondary | ICD-10-CM | POA: Insufficient documentation

## 2012-09-09 DIAGNOSIS — Z8781 Personal history of (healed) traumatic fracture: Secondary | ICD-10-CM | POA: Insufficient documentation

## 2012-09-09 DIAGNOSIS — R112 Nausea with vomiting, unspecified: Secondary | ICD-10-CM | POA: Insufficient documentation

## 2012-09-09 DIAGNOSIS — R1013 Epigastric pain: Secondary | ICD-10-CM | POA: Insufficient documentation

## 2012-09-09 DIAGNOSIS — R109 Unspecified abdominal pain: Secondary | ICD-10-CM

## 2012-09-09 HISTORY — DX: Other fracture of unspecified lower leg, initial encounter for closed fracture: S82.899A

## 2012-09-09 LAB — POCT I-STAT TROPONIN I: Troponin i, poc: 0 ng/mL (ref 0.00–0.08)

## 2012-09-09 MED ORDER — FAMOTIDINE 20 MG PO TABS: 20.0000 mg | ORAL_TABLET | Freq: Two times a day (BID) | ORAL | Status: DC

## 2012-09-09 MED ORDER — GI COCKTAIL ~~LOC~~
30.0000 mL | Freq: Once | ORAL | Status: AC
  Administered 2012-09-09: 30 mL via ORAL
  Filled 2012-09-09: qty 30

## 2012-09-09 NOTE — ED Notes (Signed)
MD at bedside. 

## 2012-09-09 NOTE — ED Notes (Signed)
Pt states that she has been having centralized chest pain that radiates across chest bilat and to back on both sides. Pt also c/o n/v that has been going on for week as well and last time she vomited was on [place].  Pt states that she has taken mylanta and will ease the pain a little. Pt describes the pain as "knots and acid".

## 2012-09-10 NOTE — ED Provider Notes (Signed)
History    37 year old female with epigastric and lower sternal pain. Gradual onset about a week ago. Relatively constant. Radiates to her back. No fevers or chills. No shortness of breath. No cough. Patient reports similar pain several months ago was evaluated in emergency room at [facility]. She states that she "and drink something that made it go away." Denies interim symptoms until a week ago. Is been trying taking Mylanta recently with mild relief. Describes the pain as "a knot" and "acid."   CSN: [id number]  Arrival date & time 09/09/12  0102   First MD Initiated Contact with Patient 09/09/12 0915      Chief Complaint  Patient presents with  . Chest Pain  . Nausea  . Emesis    (Consider location/radiation/quality/duration/timing/severity/associated sxs/prior treatment) HPI  Past Medical History  Diagnosis Date  . No pertinent past medical history   . Fracture, ankle     Past Surgical History  Procedure Laterality Date  . Tubal ligation    . Ankle surgery    . Induced abortion      Family History  Problem Relation Age of Onset  . Other Neg Hx     History  Substance Use Topics  . Smoking status: Current Every Day Smoker -- 0.50 packs/day    Types: Cigarettes  . Smokeless tobacco: Not on file  . Alcohol Use: Yes    OB History   Grav Para Term Preterm Abortions TAB SAB Ect Mult Living   6 2 2  2  2   2       Review of Systems  All systems reviewed and negative, other than as noted in HPI.   Allergies  Review of patient's allergies indicates no known allergies.  Home Medications   Current Outpatient Rx  Name  Route  Sig  Dispense  Refill  . alum & mag hydroxide-simeth (MAALOX/MYLANTA) 200-200-20 MG/5ML suspension   Oral   Take 10 mLs by mouth every 6 (six) hours as needed for indigestion.         . famotidine (PEPCID) 20 MG tablet   Oral   Take 1 tablet (20 mg total) by mouth 2 (two) times daily.   60 tablet   0     BP 103/62  Pulse 62   Resp 19  SpO2 98%  LMP 08/31/2012  Physical Exam  Nursing note and vitals reviewed. Constitutional: She appears well-developed and well-nourished. No distress.  HENT:  Head: Normocephalic and atraumatic.  Eyes: Conjunctivae are normal. Right eye exhibits no discharge. Left eye exhibits no discharge.  Neck: Neck supple.  Cardiovascular: Normal rate, regular rhythm and normal heart sounds.  Exam reveals no gallop and no friction rub.   No murmur heard. Pulmonary/Chest: Effort normal and breath sounds normal. No respiratory distress.  Abdominal: Soft. She exhibits no distension and no mass. There is tenderness. There is no rebound and no guarding.  Mild epigastric tenderness w/o rebound or guarding  Musculoskeletal: She exhibits no edema and no tenderness.  Neurological: She is alert.  Skin: Skin is warm and dry.  Psychiatric: She has a normal mood and affect. Her behavior is normal. Thought content normal.    ED Course  Procedures (including critical care time)  Labs Reviewed  POCT I-STAT TROPONIN I   Dg Chest Port 1 View  09/09/2012  *RADIOLOGY REPORT*  Clinical Data: Mid chest pain with nausea vomiting.  PORTABLE CHEST - 1 VIEW  Comparison: 01/02/2008.  Findings: 1010 hours. The heart size and  mediastinal contours are normal. The lungs are clear. There is no pleural effusion or pneumothorax. No acute osseous findings are identified.  IMPRESSION: Stable examination.  No active cardiopulmonary process.   Original Report Authenticated By: Carey Bullocks, M.D.    EKG:  Rhythm: normal sinus Vent. rate 68 BPM PR interval 152 ms QRS duration 76 ms QT/QTc 396/421 ms ST segments: NS ST changes   1. Abdominal pain       MDM  37 year old female with lower sternal/epigastric pain. Atypical for ACS. Reports complete relief with GI cocktail. EKG with no acute changes. Troponin normal. Very low suspicion for emergent etiology. Patient will be discharged with a prescription for  famotidine. Return precautions discussed. Outpatient followup otherwise.        Raeford Razor, MD 09/10/12 [phone]

## 2012-11-30 ENCOUNTER — Encounter (HOSPITAL_COMMUNITY): Payer: Self-pay | Admitting: Family

## 2012-11-30 ENCOUNTER — Inpatient Hospital Stay (HOSPITAL_COMMUNITY)
Admission: AD | Admit: 2012-11-30 | Discharge: 2012-11-30 | Disposition: A | Discharge status: Home / Self Care | Payer: Medicaid Other | Source: Ambulatory Visit | Attending: Obstetrics and Gynecology | Admitting: Obstetrics and Gynecology

## 2012-11-30 DIAGNOSIS — K219 Gastro-esophageal reflux disease without esophagitis: Secondary | ICD-10-CM | POA: Insufficient documentation

## 2012-11-30 DIAGNOSIS — R1013 Epigastric pain: Secondary | ICD-10-CM | POA: Insufficient documentation

## 2012-11-30 DIAGNOSIS — R110 Nausea: Secondary | ICD-10-CM | POA: Insufficient documentation

## 2012-11-30 LAB — COMPREHENSIVE METABOLIC PANEL
ALT: 12 U/L (ref 0–35)
AST: 14 U/L (ref 0–37)
Albumin: 3.6 g/dL (ref 3.5–5.2)
Alkaline Phosphatase: 72 U/L (ref 39–117)
BUN: 8 mg/dL (ref 6–23)
CO2: 25 mEq/L (ref 19–32)
Calcium: 9.6 mg/dL (ref 8.4–10.5)
Chloride: 103 mEq/L (ref 96–112)
Creatinine, Ser: 0.86 mg/dL (ref 0.50–1.10)
GFR calc Af Amer: >90 mL/min (ref >90)
GFR calc non Af Amer: 85 mL/min — ABNORMAL LOW (ref >90)
Glucose, Bld: 92 mg/dL (ref 70–99)
Potassium: 4.4 mEq/L (ref 3.5–5.1)
Sodium: 136 mEq/L (ref 135–145)
Total Bilirubin: 0.3 mg/dL (ref 0.3–1.2)
Total Protein: 6.9 g/dL (ref 6.0–8.3)

## 2012-11-30 LAB — CBC
HCT: 38.9 % (ref 36.0–46.0)
Hemoglobin: 14.3 g/dL (ref 12.0–15.0)
MCH: 31 pg (ref 26.0–34.0)
MCHC: 36.8 g/dL — ABNORMAL HIGH (ref 30.0–36.0)
MCV: 84.4 fL (ref 78.0–100.0)
Platelets: 277 10*3/uL (ref 150–400)
RBC: 4.61 MIL/uL (ref 3.87–5.11)
RDW: 14.6 % (ref 11.5–15.5)
WBC: 9.5 10*3/uL (ref 4.0–10.5)

## 2012-11-30 LAB — URINALYSIS, ROUTINE W REFLEX MICROSCOPIC
Bilirubin Urine: NEGATIVE
Glucose, UA: NEGATIVE mg/dL
Hgb urine dipstick: NEGATIVE
Ketones, ur: NEGATIVE mg/dL
Leukocytes, UA: NEGATIVE
Nitrite: NEGATIVE
Protein, ur: NEGATIVE mg/dL
Specific Gravity, Urine: 1.02 (ref 1.005–1.030)
Urobilinogen, UA: 0.2 mg/dL (ref 0.0–1.0)
pH: 6 (ref 5.0–8.0)

## 2012-11-30 LAB — POCT PREGNANCY, URINE: Preg Test, Ur: NEGATIVE

## 2012-11-30 MED ORDER — GI COCKTAIL ~~LOC~~
30.0000 mL | Freq: Once | ORAL | Status: AC
  Administered 2012-11-30: 30 mL via ORAL
  Filled 2012-11-30: qty 30

## 2012-11-30 MED ORDER — OMEPRAZOLE 20 MG PO CPDR: 20.0000 mg | DELAYED_RELEASE_CAPSULE | Freq: Every day | ORAL | Status: DC

## 2012-11-30 NOTE — MAU Note (Signed)
Patient presents to MAU with c/o mid epigastric pain x 3 days. Reports she took Pepto Bismol yesterday and Mylanta today. Reports normal BMs before the Mylanta.  Reports s/s improve when eating, then worsen after meals. Hx of heartburn - takes Pepcid. Reports nausea, no emesis.  Denies fever, chills.

## 2012-11-30 NOTE — MAU Provider Note (Signed)
History     CSN: [id number]  Arrival date and time: 11/30/12 1518   First Provider Initiated Contact with Patient 11/30/12 1645      Chief Complaint  Patient presents with  . Abdominal Pain   HPI Kathryn Stevenson 37 y.o.  Comes to MAU with upper abdominal pain for 3 days.  Has had a similar pain before and has been seen at [facility] and [facility].  Has been taking Pepcid.  Has had nausea today but no vomiting.  Tried to eat french fries today.  States she has been taking Pepcid daily until today.  Took Mylanta due to the upper abdominal pain and did not think she should take Mylanta and Pepcid together.  States she has changed her eating habits but the upper abdominal pain keeps returning.  OB History   Grav Para Term Preterm Abortions TAB SAB Ect Mult Living   6 2 2  2  2   2       Past Medical History  Diagnosis Date  . No pertinent past medical history   . Fracture, ankle     Past Surgical History  Procedure Laterality Date  . Tubal ligation    . Ankle surgery    . Induced abortion      Family History  Problem Relation Age of Onset  . Other Neg Hx     History  Substance Use Topics  . Smoking status: Current Every Day Smoker -- 0.50 packs/day    Types: Cigarettes  . Smokeless tobacco: Not on file  . Alcohol Use: Yes     Comment: occas    Allergies: No Known Allergies  Prescriptions prior to admission  Medication Sig Dispense Refill  . alum & mag hydroxide-simeth (MAALOX/MYLANTA) 200-200-20 MG/5ML suspension Take 10 mLs by mouth every 6 (six) hours as needed for indigestion.      . famotidine (PEPCID) 20 MG tablet Take 1 tablet (20 mg total) by mouth 2 (two) times daily.  60 tablet  0    Review of Systems  Constitutional: Negative for fever.  Respiratory: Negative for cough.   Cardiovascular: Negative for chest pain.  Gastrointestinal: Positive for nausea and abdominal pain. Negative for vomiting, diarrhea and constipation.  Genitourinary: Negative for dysuria.    Physical Exam   Blood pressure 124/69, pulse 65, temperature 98.8 F (37.1 C), temperature source Oral, resp. rate 18, last menstrual period 11/17/2012.  Physical Exam  Nursing note and vitals reviewed. Constitutional: She is oriented to person, place, and time. She appears well-developed.  Morbidly obese  HENT:  Head: Normocephalic.  Eyes: EOM are normal.  Neck: Neck supple.  GI: Soft. There is tenderness. There is no rebound and no guarding.  Tender all across upper abdomen particularly in midline and LUQ  Musculoskeletal: Normal range of motion.  Neurological: She is alert and oriented to person, place, and time.  Skin: Skin is warm and dry.  Psychiatric: She has a normal mood and affect.    MAU Course  Procedures Results for orders placed during the hospital encounter of 11/30/12 (from the past 24 hour(s))  URINALYSIS, ROUTINE W REFLEX MICROSCOPIC     Status: None   Collection Time    11/30/12  3:30 PM      Result Value Range   Color, Urine YELLOW  YELLOW   APPearance CLEAR  CLEAR   Specific Gravity, Urine 1.020  1.005 - 1.030   pH 6.0  5.0 - 8.0   Glucose, UA NEGATIVE  NEGATIVE mg/dL   Hgb urine dipstick NEGATIVE  NEGATIVE   Bilirubin Urine NEGATIVE  NEGATIVE   Ketones, ur NEGATIVE  NEGATIVE mg/dL   Protein, ur NEGATIVE  NEGATIVE mg/dL   Urobilinogen, UA 0.2  0.0 - 1.0 mg/dL   Nitrite NEGATIVE  NEGATIVE   Leukocytes, UA NEGATIVE  NEGATIVE  POCT PREGNANCY, URINE     Status: None   Collection Time    11/30/12  4:06 PM      Result Value Range   Preg Test, Ur NEGATIVE  NEGATIVE  CBC     Status: Abnormal   Collection Time    11/30/12  5:02 PM      Result Value Range   WBC 9.5  4.0 - 10.5 K/uL   RBC 4.61  3.87 - 5.11 MIL/uL   Hemoglobin 14.3  12.0 - 15.0 g/dL   HCT 16.1  09.6 - 04.5 %   MCV 84.4  78.0 - 100.0 fL   MCH 31.0  26.0 - 34.0 pg   MCHC 36.8 (*) 30.0 - 36.0 g/dL   RDW 40.9  81.1 - 91.4 %   Platelets 277  150 - 400 K/uL  COMPREHENSIVE METABOLIC  PANEL     Status: Abnormal   Collection Time    11/30/12  5:02 PM      Result Value Range   Sodium 136  135 - 145 mEq/L   Potassium 4.4  3.5 - 5.1 mEq/L   Chloride 103  96 - 112 mEq/L   CO2 25  19 - 32 mEq/L   Glucose, Bld 92  70 - 99 mg/dL   BUN 8  6 - 23 mg/dL   Creatinine, Ser 7.82  0.50 - 1.10 mg/dL   Calcium 9.6  8.4 - 95.6 mg/dL   Total Protein 6.9  6.0 - 8.3 g/dL   Albumin 3.6  3.5 - 5.2 g/dL   AST 14  0 - 37 U/L   ALT 12  0 - 35 U/L   Alkaline Phosphatase 72  39 - 117 U/L   Total Bilirubin 0.3  0.3 - 1.2 mg/dL   GFR calc non Af Amer 85 (*) >90 mL/min   GFR calc Af Amer >90  >90 mL/min   MDM Will given GI cocktail as this has helped her in the past.  Discussed at length the need for evaluation in medical office for this problem.  Has an appointment in mid July at [facility].  Advised client she can take Pepcid and Mylanta together.  Assessment and Plan  GERD  Plan GI Cocktail relieved her symptoms. Discussed diet triggers again - client reports she has been drinking lots of orange juice. Will change medication to omeprazole 20 mg QD take before meals.  Stop Pepcid and see if this medication works better for you. Keep your appointment as scheduled with [facility] in July.  BURLESON,TERRI 11/30/2012, 5:06 PM

## 2012-12-01 NOTE — MAU Provider Note (Signed)
Attestation of Attending Supervision of Advanced Practitioner (CNM/NP): Evaluation and management procedures were performed by the Advanced Practitioner under my supervision and collaboration.  I have reviewed the Advanced Practitioner's note and chart, and I agree with the management and plan.  CONSTANT,PEGGY 12/01/2012 7:41 AM

## 2013-02-27 ENCOUNTER — Emergency Department (HOSPITAL_COMMUNITY): Payer: Medicaid Other

## 2013-02-27 ENCOUNTER — Emergency Department (HOSPITAL_COMMUNITY)
Admission: EM | Admit: 2013-02-27 | Discharge: 2013-02-27 | Disposition: A | Discharge status: Home / Self Care | Payer: Medicaid Other | Attending: Emergency Medicine | Admitting: Emergency Medicine

## 2013-02-27 ENCOUNTER — Encounter (HOSPITAL_COMMUNITY): Payer: Self-pay | Admitting: Emergency Medicine

## 2013-02-27 DIAGNOSIS — R112 Nausea with vomiting, unspecified: Secondary | ICD-10-CM | POA: Insufficient documentation

## 2013-02-27 DIAGNOSIS — K219 Gastro-esophageal reflux disease without esophagitis: Secondary | ICD-10-CM | POA: Insufficient documentation

## 2013-02-27 DIAGNOSIS — R109 Unspecified abdominal pain: Secondary | ICD-10-CM

## 2013-02-27 DIAGNOSIS — R1013 Epigastric pain: Secondary | ICD-10-CM | POA: Insufficient documentation

## 2013-02-27 DIAGNOSIS — R1110 Vomiting, unspecified: Secondary | ICD-10-CM

## 2013-02-27 DIAGNOSIS — Z79899 Other long term (current) drug therapy: Secondary | ICD-10-CM | POA: Insufficient documentation

## 2013-02-27 DIAGNOSIS — F17200 Nicotine dependence, unspecified, uncomplicated: Secondary | ICD-10-CM | POA: Insufficient documentation

## 2013-02-27 DIAGNOSIS — Z8781 Personal history of (healed) traumatic fracture: Secondary | ICD-10-CM | POA: Insufficient documentation

## 2013-02-27 HISTORY — DX: Gastro-esophageal reflux disease without esophagitis: K21.9

## 2013-02-27 LAB — COMPREHENSIVE METABOLIC PANEL
ALT: 17 U/L (ref 0–35)
AST: 21 U/L (ref 0–37)
Albumin: 3.7 g/dL (ref 3.5–5.2)
Alkaline Phosphatase: 59 U/L (ref 39–117)
BUN: 6 mg/dL (ref 6–23)
CO2: 20 mEq/L (ref 19–32)
Calcium: 8.9 mg/dL (ref 8.4–10.5)
Chloride: 104 mEq/L (ref 96–112)
Creatinine, Ser: 0.78 mg/dL (ref 0.50–1.10)
GFR calc Af Amer: >90 mL/min (ref >90)
GFR calc non Af Amer: >90 mL/min (ref >90)
Glucose, Bld: 89 mg/dL (ref 70–99)
Potassium: 3.6 mEq/L (ref 3.5–5.1)
Sodium: 136 mEq/L (ref 135–145)
Total Bilirubin: 0.5 mg/dL (ref 0.3–1.2)
Total Protein: 7.3 g/dL (ref 6.0–8.3)

## 2013-02-27 LAB — CBC WITH DIFFERENTIAL/PLATELET
Basophils Absolute: 0 10*3/uL (ref 0.0–0.1)
Basophils Relative: 0 % (ref 0–1)
Eosinophils Absolute: 0.1 10*3/uL (ref 0.0–0.7)
Eosinophils Relative: 1 % (ref 0–5)
HCT: 38.4 % (ref 36.0–46.0)
Hemoglobin: 14.2 g/dL (ref 12.0–15.0)
Lymphocytes Relative: 33 % (ref 12–46)
Lymphs Abs: 2.5 10*3/uL (ref 0.7–4.0)
MCH: 30.7 pg (ref 26.0–34.0)
MCHC: 37 g/dL — ABNORMAL HIGH (ref 30.0–36.0)
MCV: 82.9 fL (ref 78.0–100.0)
Monocytes Absolute: 0.5 10*3/uL (ref 0.1–1.0)
Monocytes Relative: 7 % (ref 3–12)
Neutro Abs: 4.3 10*3/uL (ref 1.7–7.7)
Neutrophils Relative %: 59 % (ref 43–77)
Platelets: 310 10*3/uL (ref 150–400)
RBC: 4.63 MIL/uL (ref 3.87–5.11)
RDW: 14.5 % (ref 11.5–15.5)
WBC: 7.4 10*3/uL (ref 4.0–10.5)

## 2013-02-27 LAB — LIPASE, BLOOD: Lipase: 17 U/L (ref 11–59)

## 2013-02-27 LAB — URINALYSIS, ROUTINE W REFLEX MICROSCOPIC
Glucose, UA: NEGATIVE mg/dL
Ketones, ur: 15 mg/dL — AB
Nitrite: NEGATIVE
Protein, ur: 100 mg/dL — AB
Specific Gravity, Urine: 1.035 — ABNORMAL HIGH (ref 1.005–1.030)
Urobilinogen, UA: 1 mg/dL (ref 0.0–1.0)
pH: 5.5 (ref 5.0–8.0)

## 2013-02-27 LAB — URINE MICROSCOPIC-ADD ON

## 2013-02-27 MED ORDER — ONDANSETRON HCL 4 MG/2ML IJ SOLN
4.0000 mg | Freq: Once | INTRAMUSCULAR | Status: DC
  Filled 2013-02-27: qty 2

## 2013-02-27 MED ORDER — HYDROMORPHONE HCL PF 2 MG/ML IJ SOLN
2.0000 mg | Freq: Once | INTRAMUSCULAR | Status: AC
  Administered 2013-02-27: 2 mg via INTRAMUSCULAR
  Filled 2013-02-27: qty 1

## 2013-02-27 MED ORDER — HYDROMORPHONE HCL PF 1 MG/ML IJ SOLN: 1.0000 mg | Freq: Once | INTRAMUSCULAR | Status: DC

## 2013-02-27 MED ORDER — ONDANSETRON 4 MG PO TBDP
8.0000 mg | ORAL_TABLET | Freq: Once | ORAL | Status: AC
  Administered 2013-02-27: 8 mg via ORAL
  Filled 2013-02-27: qty 2

## 2013-02-27 MED ORDER — HYDROCODONE-ACETAMINOPHEN 5-325 MG PO TABS: ORAL_TABLET | ORAL | Status: DC

## 2013-02-27 MED ORDER — ONDANSETRON 4 MG PO TBDP: 4.0000 mg | ORAL_TABLET | Freq: Three times a day (TID) | ORAL | Status: DC | PRN

## 2013-02-27 MED ORDER — SODIUM CHLORIDE 0.9 % IV BOLUS (SEPSIS): 1000.0000 mL | Freq: Once | INTRAVENOUS | Status: DC

## 2013-02-27 NOTE — ED Notes (Signed)
Pt given ginger ale and saltines to attempt po challenge

## 2013-02-27 NOTE — ED Notes (Signed)
RN attempted IV 2x was unsuccessful.  IV team paged

## 2013-02-27 NOTE — ED Notes (Signed)
Pt c/o generalized abd pain x 3 days; pt sts N/V

## 2013-02-27 NOTE — ED Notes (Signed)
Patient provided [place] sandwich, crackers and ginger-ale by previous RN. Patient tolerated well. No nausea, vomiting or abdominal pain and is ready to be discharged home. Discharge instructions, information and prescriptions reviewed. Patient verbalized understanding.

## 2013-02-27 NOTE — ED Notes (Signed)
Pt reports N/V since tues with associated abdominal pain 10/10. States she started men cycle today.  Pt alert oriented X4

## 2013-02-27 NOTE — ED Provider Notes (Signed)
CSN: [id number]     Arrival date & time 02/27/13  1002 History   First MD Initiated Contact with Patient 02/27/13 1111     Chief Complaint  Patient presents with  . Emesis  . Abdominal Pain   (Consider location/radiation/quality/duration/timing/severity/associated sxs/prior Treatment) HPI Comments: Patient presents with complaint of 3 days nausea, vomiting, upper abdominal pain. She denies fever or diarrhea. 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 She denies chest pain or shortness of breath. Pain is described as dull and is worse in the upper middle and upper right abdomen. Sometimes the pain radiates to her left back. She denies urinary symptoms. She's taken over-the-counter Pepto-Bismol and Tylenol prior to arrival without relief. She has a history of GERD but states that this feels different. Onset of symptoms gradual. Course is constant. Last episode of vomiting was this morning. Vomitus is nonbloody. Nothing makes symptoms better or worse.  Patient is a 37 y.o. female presenting with vomiting and abdominal pain. The history is provided by the patient.  Emesis Associated symptoms: abdominal pain   Associated symptoms: no diarrhea, no headaches, no myalgias and no sore throat   Abdominal Pain Associated symptoms: nausea and vomiting   Associated symptoms: no chest pain, no cough, no diarrhea, no dysuria, no fever and no sore throat     Past Medical History  Diagnosis Date  . No pertinent past medical history   . Fracture, ankle   . Acid reflux    Past Surgical History  Procedure Laterality Date  . Tubal ligation    . Ankle surgery    . Induced abortion     Family History  Problem Relation Age of Onset  . Other Neg Hx    History  Substance Use Topics  . Smoking status: Current Every Day Smoker -- 0.50 packs/day    Types: Cigarettes  . Smokeless tobacco: Not on file  . Alcohol Use: Yes     Comment: occas   OB History    Grav Para Term Preterm Abortions TAB SAB Ect Mult Living   6 2 2  2  2   2      Review of Systems  Constitutional: Negative for fever.  HENT: Negative for sore throat and rhinorrhea.   Eyes: Negative for redness.  Respiratory: Negative for cough.   Cardiovascular: Negative for chest pain.  Gastrointestinal: Positive for nausea, vomiting and abdominal pain. Negative for diarrhea and blood in stool.  Genitourinary: Negative for dysuria.  Musculoskeletal: Negative for myalgias.  Skin: Negative for rash.  Neurological: Negative for headaches.    Allergies  Review of patient's allergies indicates no known allergies.  Home Medications   Current Outpatient Rx  Name  Route  Sig  Dispense  Refill  . acetaminophen (TYLENOL) 500 MG tablet   Oral   Take 500 mg by mouth every 6 (six) hours as needed for pain.         Marland Kitchen bismuth subsalicylate (PEPTO BISMOL) 262 MG/15ML suspension   Oral   Take 15 mLs by mouth every 6 (six) hours as needed for indigestion.         Marland Kitchen omeprazole (PRILOSEC) 20 MG capsule   Oral   Take 1 capsule (20 mg total) by mouth daily. Take before a meal.  Do not crush or chew.   30 capsule   1    BP 114/69  Pulse 76  Temp(Src) 98.8 F (37.1 C) (Oral)  Resp 18  SpO2 99%  Physical Exam  Nursing note and vitals reviewed. Constitutional: She appears well-developed and well-nourished.  HENT:  Head: Normocephalic and atraumatic.  Eyes: Conjunctivae are normal. Right eye exhibits no discharge. Left eye exhibits no discharge.  Neck: Normal range of motion. Neck supple.  Cardiovascular: Normal rate, regular rhythm and normal heart sounds.   Pulmonary/Chest: Effort normal and breath sounds normal.  Abdominal: Soft. Bowel sounds are normal. There is tenderness (mild) in the right upper quadrant and epigastric area. There is no rebound, no guarding, no CVA tenderness, no tenderness at McBurney's point and negative Murphy's sign.  obese  Neurological: She is alert.   Skin: Skin is warm and dry.  Psychiatric: She has a normal mood and affect.    ED Course  Procedures (including critical care time) Labs Review Labs Reviewed  CBC WITH DIFFERENTIAL - Abnormal; Notable for the following:    MCHC 37.0 (*)    All other components within normal limits  URINALYSIS, ROUTINE W REFLEX MICROSCOPIC - Abnormal; Notable for the following:    Color, Urine AMBER (*)    APPearance CLOUDY (*)    Specific Gravity, Urine 1.035 (*)    Hgb urine dipstick LARGE (*)    Bilirubin Urine SMALL (*)    Ketones, ur 15 (*)    Protein, ur 100 (*)    Leukocytes, UA MODERATE (*)    All other components within normal limits  URINE MICROSCOPIC-ADD ON - Abnormal; Notable for the following:    Bacteria, UA FEW (*)    All other components within normal limits  URINE CULTURE  COMPREHENSIVE METABOLIC PANEL  LIPASE, BLOOD   Imaging Review US Abdomen Complete  02/27/2013   *RADIOLOGY REPORT*  Abdominal ultrasound  History:  Abdominal pain  Comparison:  None  Findings:  Gallbladder is visualized in multiple projections. There are no gallstones, gallbladder wall thickening, or pericholecystic fluid collection.  There is no intrahepatic, common hepatic, common bile duct dilatation.  The pancreas appears normal.  There is fatty change in the liver.  No focal liver lesions are identified.  Spleen is normal in size and homogeneous in echotexture.  Kidneys bilaterally appear normal.  There is no ascites.  Aorta is nonaneurysmal.  Inferior vena cava appears normal.  Conclusion:  Fatty liver.  While no focal liver lesions are identified, it must be cautioned that the sensitivity of ultrasound for more subtle liver lesions is diminished given underlying fatty change.  Study otherwise unremarkable.   Original Report Authenticated By: Bretta Bang, M.D.    11:52 AM Patient seen and examined. Work-up initiated. Medications ordered.   Vital signs reviewed and are as follows: Filed Vitals:    02/27/13 1009  BP: 114/69  Pulse: 76  Temp: 98.8 F (37.1 C)  Resp: 18   Patient seen previously by Dr. Lynelle Doctor. Pt informed of [place] results.   Pt feeling much better and has been able to drink ginger ale and eat crackers and a sandwich in room without vomiting.   Will d/c with pain medication and nausea medication.   Patient counseled on use of narcotic pain medications. Counseled not to combine these medications with others containing tylenol. Urged not to drink alcohol, drive, or perform any other activities that requires focus while taking these medications. The patient verbalizes understanding and agrees with the plan.  4:05 PM The patient was urged to return to the Emergency Department immediately with worsening of current symptoms, worsening abdominal pain, persistent vomiting, blood noted in stools, fever,  or any other concerns. The patient verbalized understanding.   MDM   1. Abdominal pain   2. Vomiting    Patient with abd pain, neg abd [place], reassuring labs, tolerating POs in ED. Suspect gastritis/enteritis. She appears well. Abd soft. Doubt appendicitis. Do not feel CT is necessary. Supportive care, return with worsening.     Renne Crigler, PA-C 02/27/13 [phone]

## 2013-02-28 LAB — URINE CULTURE
Colony Count: NO GROWTH
Culture: NO GROWTH

## 2013-03-01 NOTE — ED Provider Notes (Signed)
Medical screening examination/treatment/procedure(s) were conducted as a shared visit with non-physician practitioner(s) and myself.  I personally evaluated the patient during the encounter  Pt with mild upper abdominal pain and vomiting.  [place] negative.  Labs reassuring.  Precautions given.  At this time there does not appear to be any evidence of an acute emergency medical condition and the patient appears stable for discharge with appropriate outpatient follow up.   Celene Kras, MD 03/01/13 [phone]

## 2013-04-07 ENCOUNTER — Encounter (HOSPITAL_COMMUNITY): Payer: Self-pay | Admitting: *Deleted

## 2013-04-07 ENCOUNTER — Inpatient Hospital Stay (HOSPITAL_COMMUNITY)
Admission: AD | Admit: 2013-04-07 | Discharge: 2013-04-07 | Disposition: A | Discharge status: Home / Self Care | Payer: Medicaid Other | Source: Ambulatory Visit | Attending: Obstetrics & Gynecology | Admitting: Obstetrics & Gynecology

## 2013-04-07 DIAGNOSIS — R300 Dysuria: Secondary | ICD-10-CM | POA: Insufficient documentation

## 2013-04-07 DIAGNOSIS — B373 Candidiasis of vulva and vagina: Secondary | ICD-10-CM

## 2013-04-07 DIAGNOSIS — B3731 Acute candidiasis of vulva and vagina: Secondary | ICD-10-CM | POA: Insufficient documentation

## 2013-04-07 DIAGNOSIS — A499 Bacterial infection, unspecified: Secondary | ICD-10-CM | POA: Insufficient documentation

## 2013-04-07 DIAGNOSIS — B9689 Other specified bacterial agents as the cause of diseases classified elsewhere: Secondary | ICD-10-CM | POA: Insufficient documentation

## 2013-04-07 DIAGNOSIS — N760 Acute vaginitis: Secondary | ICD-10-CM | POA: Insufficient documentation

## 2013-04-07 LAB — URINALYSIS, ROUTINE W REFLEX MICROSCOPIC
Bilirubin Urine: NEGATIVE
Glucose, UA: NEGATIVE mg/dL
Ketones, ur: NEGATIVE mg/dL
Nitrite: NEGATIVE
Protein, ur: NEGATIVE mg/dL
Specific Gravity, Urine: >1.03 — ABNORMAL HIGH (ref 1.005–1.030)
Urobilinogen, UA: 0.2 mg/dL (ref 0.0–1.0)
pH: 6 (ref 5.0–8.0)

## 2013-04-07 LAB — URINE MICROSCOPIC-ADD ON

## 2013-04-07 LAB — WET PREP, GENITAL
Trich, Wet Prep: NONE SEEN
Yeast Wet Prep HPF POC: NONE SEEN

## 2013-04-07 LAB — POCT PREGNANCY, URINE: Preg Test, Ur: NEGATIVE

## 2013-04-07 MED ORDER — METRONIDAZOLE 500 MG PO TABS: 500.0000 mg | ORAL_TABLET | Freq: Two times a day (BID) | ORAL | Status: DC

## 2013-04-07 MED ORDER — LIDOCAINE 5 % EX OINT: TOPICAL_OINTMENT | CUTANEOUS | Status: DC | PRN

## 2013-04-07 MED ORDER — FLUCONAZOLE 150 MG PO TABS
150.0000 mg | ORAL_TABLET | Freq: Once | ORAL | Status: AC
  Administered 2013-04-07: 150 mg via ORAL
  Filled 2013-04-07: qty 1

## 2013-04-07 NOTE — MAU Note (Signed)
Patient states she has been having pain with urination for about 2 days.

## 2013-04-07 NOTE — MAU Provider Note (Signed)
History     CSN: [id number]  Arrival date and time: 04/07/13 0900   First Provider Initiated Contact with Patient 04/07/13 [phone]      Chief Complaint  Patient presents with  . Dysuria   HPI Ms. Kathryn Stevenson is a 37 y.o. [id number] who presents to MAU today with complaint of dysuria x 3 days. The patient also has some urgency and feelings of incomplete emptying without frequency. She denies pelvic pain, fever, flank pain, vaginal discharge or bleeding.   OB History   Grav Para Term Preterm Abortions TAB SAB Ect Mult Living   6 2 2  2  2   2       Past Medical History  Diagnosis Date  . No pertinent past medical history   . Fracture, ankle   . Acid reflux     Past Surgical History  Procedure Laterality Date  . Tubal ligation    . Ankle surgery    . Induced abortion      Family History  Problem Relation Age of Onset  . Other Neg Hx     History  Substance Use Topics  . Smoking status: Current Every Day Smoker -- 0.50 packs/day    Types: Cigarettes  . Smokeless tobacco: Not on file  . Alcohol Use: Yes     Comment: occas    Allergies: No Known Allergies  Prescriptions prior to admission  Medication Sig Dispense Refill  . acetaminophen (TYLENOL) 500 MG tablet Take 500 mg by mouth every 6 (six) hours as needed for pain.      Marland Kitchen HYDROcodone-acetaminophen (NORCO/VICODIN) 5-325 MG per tablet Take 1-2 tablets every 6 hours as needed for severe pain  10 tablet  0  . omeprazole (PRILOSEC) 20 MG capsule Take 1 capsule (20 mg total) by mouth daily. Take before a meal.  Do not crush or chew.  30 capsule  1    Review of Systems  Constitutional: Negative for fever.  Gastrointestinal: Positive for nausea and diarrhea. Negative for vomiting, abdominal pain, constipation, blood in stool and melena.  Genitourinary: Positive for dysuria and urgency. Negative for frequency, hematuria and flank pain.   Physical Exam   Blood pressure 118/81, pulse 59, temperature 98.8 F (37.1  C), temperature source Oral, resp. rate 20, height 5\' 5"  (1.651 m), weight 293 lb 6.4 oz (133.085 kg), last menstrual period 03/26/2013, SpO2 100.00%.  Physical Exam  Constitutional: She is oriented to person, place, and time. She appears well-developed and well-nourished. No distress.  HENT:  Head: Normocephalic and atraumatic.  Cardiovascular: Normal rate, regular rhythm and normal heart sounds.   Respiratory: Effort normal and breath sounds normal. No respiratory distress.  GI: Soft. Bowel sounds are normal. She exhibits no distension and no mass. There is no tenderness. There is no rebound and no guarding.  Genitourinary: Uterus is not enlarged and not tender. Cervix exhibits no motion tenderness, no discharge and no friability. Right adnexum displays no mass and no tenderness. Left adnexum displays no mass and no tenderness. No bleeding around the vagina. Vaginal discharge (moderate amount of thick, white discharge noted) found.  Neurological: She is alert and oriented to person, place, and time.  Skin: Skin is warm and dry. No erythema.  Psychiatric: She has a normal mood and affect.   Results for orders placed during the hospital encounter of 04/07/13 (from the past 24 hour(s))  URINALYSIS, ROUTINE W REFLEX MICROSCOPIC     Status: Abnormal   Collection Time  04/07/13  9:10 AM      Result Value Range   Color, Urine YELLOW  YELLOW   APPearance HAZY (*) CLEAR   Specific Gravity, Urine >1.030 (*) 1.005 - 1.030   pH 6.0  5.0 - 8.0   Glucose, UA NEGATIVE  NEGATIVE mg/dL   Hgb urine dipstick TRACE (*) NEGATIVE   Bilirubin Urine NEGATIVE  NEGATIVE   Ketones, ur NEGATIVE  NEGATIVE mg/dL   Protein, ur NEGATIVE  NEGATIVE mg/dL   Urobilinogen, UA 0.2  0.0 - 1.0 mg/dL   Nitrite NEGATIVE  NEGATIVE   Leukocytes, UA TRACE (*) NEGATIVE  URINE MICROSCOPIC-ADD ON     Status: Abnormal   Collection Time    04/07/13  9:10 AM      Result Value Range   Squamous Epithelial / LPF FEW (*) RARE    WBC, UA 7-10  <3 WBC/hpf   RBC / HPF 0-2  <3 RBC/hpf   Bacteria, UA FEW (*) RARE   Urine-Other MUCOUS PRESENT    POCT PREGNANCY, URINE     Status: None   Collection Time    04/07/13  9:22 AM      Result Value Range   Preg Test, Ur NEGATIVE  NEGATIVE  WET PREP, GENITAL     Status: Abnormal   Collection Time    04/07/13  9:46 AM      Result Value Range   Yeast Wet Prep HPF POC NONE SEEN  NONE SEEN   Trich, Wet Prep NONE SEEN  NONE SEEN   Clue Cells Wet Prep HPF POC FEW (*) NONE SEEN   WBC, Wet Prep HPF POC FEW (*) NONE SEEN    MAU Course  Procedures None  MDM UPT - Negative UA, Wet prep and GC/Chlamydia today 150 mg Diflucan given in MAU today Assessment and Plan  A: Yeast vulvovaginitis, clinical Bacterial vaginosis  P: Discharge home Rx for Flagyl and lidocaine ointment sent to patient's pharmacy Discussed hygiene products and probiotics for avoiding recurrence Patient may return to MAU as needed or if her condition were to change or worsen  Freddi Starr, PA-C  04/07/2013, 10:44 AM

## 2013-04-08 LAB — URINE CULTURE
Colony Count: NO GROWTH
Culture: NO GROWTH

## 2013-04-08 LAB — GC/CHLAMYDIA PROBE AMP
CT Probe RNA: NEGATIVE
GC Probe RNA: NEGATIVE

## 2013-07-25 ENCOUNTER — Inpatient Hospital Stay (HOSPITAL_COMMUNITY)
Admission: AD | Admit: 2013-07-25 | Discharge: 2013-07-25 | Disposition: A | Discharge status: Home / Self Care | Payer: Medicaid Other | Source: Ambulatory Visit | Attending: Obstetrics and Gynecology | Admitting: Obstetrics and Gynecology

## 2013-07-25 ENCOUNTER — Encounter (HOSPITAL_COMMUNITY): Payer: Self-pay | Admitting: General Practice

## 2013-07-25 DIAGNOSIS — F17200 Nicotine dependence, unspecified, uncomplicated: Secondary | ICD-10-CM | POA: Insufficient documentation

## 2013-07-25 DIAGNOSIS — K219 Gastro-esophageal reflux disease without esophagitis: Secondary | ICD-10-CM | POA: Insufficient documentation

## 2013-07-25 DIAGNOSIS — Z3202 Encounter for pregnancy test, result negative: Secondary | ICD-10-CM | POA: Insufficient documentation

## 2013-07-25 DIAGNOSIS — Z9851 Tubal ligation status: Secondary | ICD-10-CM | POA: Insufficient documentation

## 2013-07-25 DIAGNOSIS — R1013 Epigastric pain: Secondary | ICD-10-CM

## 2013-07-25 LAB — URINALYSIS, ROUTINE W REFLEX MICROSCOPIC
Bilirubin Urine: NEGATIVE
Glucose, UA: NEGATIVE mg/dL
Ketones, ur: NEGATIVE mg/dL
Leukocytes, UA: NEGATIVE
Nitrite: NEGATIVE
Protein, ur: NEGATIVE mg/dL
Specific Gravity, Urine: 1.025 (ref 1.005–1.030)
Urobilinogen, UA: 0.2 mg/dL (ref 0.0–1.0)
pH: 6 (ref 5.0–8.0)

## 2013-07-25 LAB — URINE MICROSCOPIC-ADD ON

## 2013-07-25 LAB — POCT PREGNANCY, URINE: Preg Test, Ur: NEGATIVE

## 2013-07-25 MED ORDER — GI COCKTAIL ~~LOC~~
30.0000 mL | Freq: Once | ORAL | Status: AC
  Administered 2013-07-25: 30 mL via ORAL
  Filled 2013-07-25: qty 30

## 2013-07-25 NOTE — MAU Provider Note (Signed)
History     CSN: [id number]  Arrival date and time: 07/25/13 4401   First Provider Initiated Contact with Patient 07/25/13 [phone]      Chief Complaint  Patient presents with  . Abdominal Pain  . Nausea   HPI Kathryn Stevenson is a 38 y.o. female who presents to the MAU with complaints of epigastric pain and nausea. She states that the pain and nausea started about 2 weeks ago. She describes the pain as "crampy" and "feels like knots." She states that the pain increases when she is hungry and is relieved when she eats. She had 1 episode of non-bloody emesis 2 days ago but she feels like vomiting many times throughout the day d/t her nausea. She has a h/o GERD which she is currently taking Prilosec 20mg  and states that it is well controlled. She feels that the pain is not the same. She is not sure of pregnancy status although she has h/o tubal ligation. Denies HA, fevers, chills, CP, or weakness.      Past Medical History  Diagnosis Date  . No pertinent past medical history   . Fracture, ankle   . Acid reflux     Past Surgical History  Procedure Laterality Date  . Tubal ligation    . Ankle surgery    . Induced abortion      Family History  Problem Relation Age of Onset  . Other Neg Hx     History  Substance Use Topics  . Smoking status: Current Every Day Smoker -- 0.50 packs/day    Types: Cigarettes  . Smokeless tobacco: Not on file  . Alcohol Use: Yes     Comment: occas    Allergies: No Known Allergies  Prescriptions prior to admission  Medication Sig Dispense Refill  . alum & mag hydroxide-simeth (MAALOX/MYLANTA) 200-200-20 MG/5ML suspension Take 15 mLs by mouth daily as needed for indigestion or heartburn.      . benzocaine-resorcinol (VAGISIL) 5-2 % vaginal cream Place 1 application vaginally at bedtime as needed for itching.      . famotidine (PEPCID) 20 MG tablet Take 20 mg by mouth daily.        Review of Systems  Constitutional: Negative for chills.  HENT:  Negative for sore throat.   Eyes: Negative for blurred vision.  Respiratory: Negative for cough and shortness of breath.   Cardiovascular: Negative for chest pain.  Gastrointestinal: Negative for heartburn and blood in stool.  Genitourinary: Negative for flank pain.  Musculoskeletal: Negative for back pain.  Neurological: Negative for dizziness and weakness.   Physical Exam   There were no vitals taken for this visit.  Physical Exam  Constitutional: She is oriented to person, place, and time. She appears well-developed and well-nourished.  HENT:  Head: Normocephalic.  Eyes: Pupils are equal, round, and reactive to light.  Cardiovascular: Normal rate, regular rhythm and normal heart sounds.  Exam reveals no gallop and no friction rub.   No murmur heard. Respiratory: Effort normal and breath sounds normal. No respiratory distress. She has no wheezes. She has no rales. She exhibits no tenderness.  GI: Soft. Bowel sounds are normal. She exhibits no distension. There is no tenderness. There is no rebound and no guarding.  Musculoskeletal: She exhibits no edema and no tenderness.  Neurological: She is alert and oriented to person, place, and time.  Skin: Skin is warm. No rash noted. No erythema.    MAU Course  Procedures  MDM 1. UPT >>> negative 2.  Given GI cocktail >>> symptoms improved  3. Discharged home with MAALOX/MYLANTA, PEPCID  Assessment and Plan  Assessment: Kathryn Stevenson is a 38 y.o. female who presents to the MAU with complaints of epigastric pain and nausea x 2 weeks. She had 1 episode of non-bloody emesis. She does not think it is related to her h/o GERD. PE was unremarkable. No tenderness on palpation throughout abdomen. No CVA tenderness. McBurney's and Murphy's were negative. UPT was negative. Based on her HPI and PE, her symptoms may be d/t GERD or GI ulcer (gastric vs. Duodenal). She should see her PCP for possible GI consult and workup.    Plan: 1. Give GI cocktail  to eval for symptomatic relief  2. Refer pt to her PCP for further eval and possible GI consult       Herb Grays, T 07/25/2013, 10:15 AM   Seen and examined by me also Agree with note Discussed with patient the fact that she has been seen for this problem several times.  I suggest she call her family doctor and may want to ask for a referral to a Gastroenterologist. Continue meds at home Seabron Spates, CNM

## 2013-07-25 NOTE — Discharge Instructions (Signed)
Gastroesophageal Reflux Disease, Adult  Gastroesophageal reflux disease (GERD) happens when acid from your stomach flows up into the esophagus. When acid comes in contact with the esophagus, the acid causes soreness (inflammation) in the esophagus. Over time, GERD may create small holes (ulcers) in the lining of the esophagus.  CAUSES   · Increased body weight. This puts pressure on the stomach, making acid rise from the stomach into the esophagus.  · Smoking. This increases acid production in the stomach.  · Drinking alcohol. This causes decreased pressure in the lower esophageal sphincter (valve or ring of muscle between the esophagus and stomach), allowing acid from the stomach into the esophagus.  · Late evening meals and a full stomach. This increases pressure and acid production in the stomach.  · A malformed lower esophageal sphincter.  Sometimes, no cause is found.  SYMPTOMS   · Burning pain in the lower part of the mid-chest behind the breastbone and in the mid-stomach area. This may occur twice a week or more often.  · Trouble swallowing.  · Sore throat.  · Dry cough.  · Asthma-like symptoms including chest tightness, shortness of breath, or wheezing.  DIAGNOSIS   Your caregiver may be able to diagnose GERD based on your symptoms. In some cases, X-rays and other tests may be done to check for complications or to check the condition of your stomach and esophagus.  TREATMENT   Your caregiver may recommend over-the-counter or prescription medicines to help decrease acid production. Ask your caregiver before starting or adding any new medicines.   HOME CARE INSTRUCTIONS   · Change the factors that you can control. Ask your caregiver for guidance concerning weight loss, quitting smoking, and alcohol consumption.  · Avoid foods and drinks that make your symptoms worse, such as:  · Caffeine or alcoholic drinks.  · Chocolate.  · Peppermint or mint flavorings.  · Garlic and onions.  · Spicy foods.  · Citrus fruits,  such as oranges, lemons, or limes.  · Tomato-based foods such as sauce, chili, salsa, and pizza.  · Fried and fatty foods.  · Avoid lying down for the 3 hours prior to your bedtime or prior to taking a nap.  · Eat small, frequent meals instead of large meals.  · Wear loose-fitting clothing. Do not wear anything tight around your waist that causes pressure on your stomach.  · Raise the head of your bed 6 to 8 inches with wood blocks to help you sleep. Extra pillows will not help.  · Only take over-the-counter or prescription medicines for pain, discomfort, or fever as directed by your caregiver.  · Do not take aspirin, ibuprofen, or other nonsteroidal anti-inflammatory drugs (NSAIDs).  SEEK IMMEDIATE MEDICAL CARE IF:   · You have pain in your arms, neck, jaw, teeth, or back.  · Your pain increases or changes in intensity or duration.  · You develop nausea, vomiting, or sweating (diaphoresis).  · You develop shortness of breath, or you faint.  · Your vomit is green, yellow, black, or looks like coffee grounds or blood.  · Your stool is red, bloody, or black.  These symptoms could be signs of other problems, such as heart disease, gastric bleeding, or esophageal bleeding.  MAKE SURE YOU:   · Understand these instructions.  · Will watch your condition.  · Will get help right away if you are not doing well or get worse.  Document Released: 03/15/2005 Document Revised: 08/28/2011 Document Reviewed: 12/23/2010  [employer]® Patient   Information ©2014 [employer].

## 2013-07-25 NOTE — MAU Note (Signed)
Pt states the pain is better after she eats and is worse when she's hungry

## 2013-07-25 NOTE — MAU Note (Signed)
Not feeling well for a while.  Pain more in mid/upper abd and nausea - both started shortly after last period.? LMP thinks 2nd wk in Jan, has not done home test, was afraid to. At first thought it was related to acid reflux, but has been taking meds, not having heartburn but the pain continues.

## 2013-07-25 NOTE — MAU Provider Note (Signed)
Attestation of Attending Supervision of Advanced Practitioner (CNM/NP): Evaluation and management procedures were performed by the Advanced Practitioner under my supervision and collaboration.  I have reviewed the Advanced Practitioner's note and chart, and I agree with the management and plan.  CONSTANT,PEGGY 07/25/2013 3:47 PM

## 2013-07-25 NOTE — MAU Note (Signed)
Pt presents to MAU with c/o nausea and abdominal pain for the past 2 weeks. Pt states she vomited 2 days ago and felt better for 30 minutes but the pain and nausea came back. Yesterday, pt states she broke out in a sweat and had to rest for about 30 minutes. Pt states she has a history of acid reflux and takes OTC medication famatidine for it since September but states this is different pain.

## 2013-08-29 ENCOUNTER — Emergency Department (HOSPITAL_COMMUNITY)
Admission: EM | Admit: 2013-08-29 | Discharge: 2013-08-29 | Disposition: A | Discharge status: Home / Self Care | Payer: Medicaid Other | Attending: Emergency Medicine | Admitting: Emergency Medicine

## 2013-08-29 ENCOUNTER — Encounter (HOSPITAL_COMMUNITY): Payer: Self-pay | Admitting: Emergency Medicine

## 2013-08-29 DIAGNOSIS — Z8781 Personal history of (healed) traumatic fracture: Secondary | ICD-10-CM | POA: Insufficient documentation

## 2013-08-29 DIAGNOSIS — L02412 Cutaneous abscess of left axilla: Secondary | ICD-10-CM

## 2013-08-29 DIAGNOSIS — Z79899 Other long term (current) drug therapy: Secondary | ICD-10-CM | POA: Insufficient documentation

## 2013-08-29 DIAGNOSIS — F17200 Nicotine dependence, unspecified, uncomplicated: Secondary | ICD-10-CM | POA: Insufficient documentation

## 2013-08-29 DIAGNOSIS — IMO0002 Reserved for concepts with insufficient information to code with codable children: Secondary | ICD-10-CM | POA: Insufficient documentation

## 2013-08-29 DIAGNOSIS — R509 Fever, unspecified: Secondary | ICD-10-CM | POA: Insufficient documentation

## 2013-08-29 DIAGNOSIS — K219 Gastro-esophageal reflux disease without esophagitis: Secondary | ICD-10-CM | POA: Insufficient documentation

## 2013-08-29 MED ORDER — HYDROCODONE-ACETAMINOPHEN 5-325 MG PO TABS
2.0000 | ORAL_TABLET | Freq: Once | ORAL | Status: AC
  Administered 2013-08-29: 2 via ORAL
  Filled 2013-08-29: qty 2

## 2013-08-29 MED ORDER — HYDROCODONE-ACETAMINOPHEN 5-325 MG PO TABS: 2.0000 | ORAL_TABLET | ORAL | Status: DC | PRN

## 2013-08-29 NOTE — ED Provider Notes (Signed)
CSN: [id number]     Arrival date & time 08/29/13  7169 History   First MD Initiated Contact with Patient 08/29/13 [phone]     Chief Complaint  Patient presents with  . Abscess     (Consider location/radiation/quality/duration/timing/severity/associated sxs/prior Treatment) Patient is a 38 y.o. female presenting with abscess. The history is provided by the patient. No language interpreter was used.  Abscess Location:  Shoulder/arm Shoulder/arm abscess location:  L axilla Size:  5cm Abscess quality: induration, painful and redness   Duration:  3 days Progression:  Worsening Pain details:    Quality:  Aching   Severity:  Moderate   Duration:  3 days   Progression:  Worsening Chronicity:  Recurrent Context: not diabetes, not immunosuppression, not injected drug use, not insect bite/sting and not skin injury   Relieved by:  Nothing Ineffective treatments:  Warm compresses (topical anesthetic) Associated symptoms: fever   Associated symptoms: no fatigue, no headaches, no nausea and no vomiting   Fever:    Temp source:  Subjective and oral   Progression:  Waxing and waning Risk factors: prior abscess     Past Medical History  Diagnosis Date  . No pertinent past medical history   . Fracture, ankle   . Acid reflux    Past Surgical History  Procedure Laterality Date  . Tubal ligation    . Ankle surgery    . Induced abortion     Family History  Problem Relation Age of Onset  . Other Neg Hx    History  Substance Use Topics  . Smoking status: Current Every Day Smoker -- 0.50 packs/day    Types: Cigarettes  . Smokeless tobacco: Not on file  . Alcohol Use: Yes     Comment: occas   OB History   Grav Para Term Preterm Abortions TAB SAB Ect Mult Living   6 2 2  2  2   2      Review of Systems  Constitutional: Positive for fever. Negative for chills, diaphoresis, activity change, appetite change and fatigue.  HENT: Negative for congestion, facial swelling, rhinorrhea and  sore throat.   Eyes: Negative for photophobia and discharge.  Respiratory: Negative for cough, chest tightness and shortness of breath.   Cardiovascular: Negative for chest pain, palpitations and leg swelling.  Gastrointestinal: Negative for nausea, vomiting, abdominal pain and diarrhea.  Endocrine: Negative for polydipsia and polyuria.  Genitourinary: Negative for dysuria, frequency, difficulty urinating and pelvic pain.  Musculoskeletal: Negative for arthralgias, back pain, neck pain and neck stiffness.  Skin: Negative for color change and wound.  Allergic/Immunologic: Negative for immunocompromised state.  Neurological: Negative for facial asymmetry, weakness, numbness and headaches.  Hematological: Does not bruise/bleed easily.  Psychiatric/Behavioral: Negative for confusion and agitation.      Allergies  Review of patient's allergies indicates no known allergies.  Home Medications   Current Outpatient Rx  Name  Route  Sig  Dispense  Refill  . alum & mag hydroxide-simeth (MAALOX/MYLANTA) 200-200-20 MG/5ML suspension   Oral   Take 15 mLs by mouth daily as needed for indigestion or heartburn.         . benzocaine-resorcinol (VAGISIL) 5-2 % vaginal cream   Vaginal   Place 1 application vaginally at bedtime as needed for itching.         . famotidine (PEPCID) 20 MG tablet   Oral   Take 20 mg by mouth daily.          BP 116/71  Pulse 80  Temp(Src) 98.2 F (36.8 C) (Oral)  Resp 18  Ht 5\' 6"  (1.676 m)  Wt 280 lb (127.007 kg)  BMI 45.21 kg/m2  SpO2 100%  LMP 08/27/2013 Physical Exam  Constitutional: She is oriented to person, place, and time. She appears well-developed and well-nourished. No distress.  HENT:  Head: Normocephalic and atraumatic.  Mouth/Throat: No oropharyngeal exudate.  Eyes: Pupils are equal, round, and reactive to light.  Neck: Normal range of motion. Neck supple.  Cardiovascular: Normal rate, regular rhythm and normal heart sounds.  Exam  reveals no gallop and no friction rub.   No murmur heard. Pulmonary/Chest: Effort normal and breath sounds normal. No respiratory distress. She has no wheezes. She has no rales.  Abdominal: Soft. Bowel sounds are normal. She exhibits no distension and no mass. There is no tenderness. There is no rebound and no guarding.  Musculoskeletal: Normal range of motion. She exhibits no edema and no tenderness.       Arms: Neurological: She is alert and oriented to person, place, and time.  Skin: Skin is warm and dry.  Psychiatric: She has a normal mood and affect.    ED Course  INCISION AND DRAINAGE Date/Time: 08/29/2013 8:51 AM Performed by: Ernestina Patches, E Authorized by: Ernestina Patches, E Consent: Verbal consent obtained. written consent not obtained. Risks and benefits: risks, benefits and alternatives were discussed Consent given by: patient Patient understanding: patient states understanding of the procedure being performed Patient consent: the patient's understanding of the procedure matches consent given Procedure consent: procedure consent matches procedure scheduled Relevant documents: relevant documents present and verified Test results: test results available and properly labeled Site marked: the operative site was marked Imaging studies: imaging studies available Required items: required blood products, implants, devices, and special equipment available Patient identity confirmed: verbally with patient Time out: Immediately prior to procedure a "time out" was called to verify the correct patient, procedure, equipment, support staff and site/side marked as required. Type: abscess Body area: upper extremity (Left axilla) Anesthesia: local infiltration Local anesthetic: lidocaine 1% without epinephrine Anesthetic total: 3 ml Patient sedated: no Scalpel size: 11 Needle gauge: 25. Incision type: single straight Complexity: simple Drainage: purulent Drainage amount:  copious Wound treatment: drain placed Packing material: 1/2 in iodoform gauze Patient tolerance: Patient tolerated the procedure well with no immediate complications.   (including critical care time) Labs Review Labs Reviewed - No data to display Imaging Review No results found.   EKG Interpretation None      MDM   Final diagnoses:  Abscess of left axilla    Pt is a 38 y.o. female with Pmhx as above who presents with about 3 days of developing abscess in L armpit.  She also reports subjective fevers. On PE, VSS, pt in NAD.  She has large, fluctuant abscess in L axilla with minimal surrounding erythema. I&D done with large amount of purulent fluid.  Packing placed.  As there is not significant surrounding cellulitis, I&D will be definitinve treatment. Pt can removing packing in 2 days.  Return precautions given for new or worsening symptoms including worsening pain, swelling, redness, fever.          Neta Ehlers, MD 08/29/13 [phone]

## 2013-08-29 NOTE — Discharge Instructions (Signed)
Abscess An abscess is an infected area that contains a collection of pus and debris.It can occur in almost any part of the body. An abscess is also known as a furuncle or boil. CAUSES  An abscess occurs when tissue gets infected. This can occur from blockage of oil or sweat glands, infection of hair follicles, or a minor injury to the skin. As the body tries to fight the infection, pus collects in the area and creates pressure under the skin. This pressure causes pain. People with weakened immune systems have difficulty fighting infections and get certain abscesses more often.  SYMPTOMS Usually an abscess develops on the skin and becomes a painful mass that is red, warm, and tender. If the abscess forms under the skin, you may feel a moveable soft area under the skin. Some abscesses break open (rupture) on their own, but most will continue to get worse without care. The infection can spread deeper into the body and eventually into the bloodstream, causing you to feel ill.  DIAGNOSIS  Your caregiver will take your medical history and perform a physical exam. A sample of fluid may also be taken from the abscess to determine what is causing your infection. TREATMENT  Your caregiver may prescribe antibiotic medicines to fight the infection. However, taking antibiotics alone usually does not cure an abscess. Your caregiver may need to make a small cut (incision) in the abscess to drain the pus. In some cases, gauze is packed into the abscess to reduce pain and to continue draining the area. HOME CARE INSTRUCTIONS   Only take over-the-counter or prescription medicines for pain, discomfort, or fever as directed by your caregiver.  If you were prescribed antibiotics, take them as directed. Finish them even if you start to feel better.  If gauze is used, follow your caregiver's directions for changing the gauze.  To avoid spreading the infection:  Keep your draining abscess covered with a  bandage.  Wash your hands well.  Do not share personal care items, towels, or whirlpools with others.  Avoid skin contact with others.  Keep your skin and clothes clean around the abscess.  Keep all follow-up appointments as directed by your caregiver. SEEK MEDICAL CARE IF:   You have increased pain, swelling, redness, fluid drainage, or bleeding.  You have muscle aches, chills, or a general ill feeling.  You have a fever. MAKE SURE YOU:   Understand these instructions.  Will watch your condition.  Will get help right away if you are not doing well or get worse. Document Released: 03/15/2005 Document Revised: 12/05/2011 Document Reviewed: 08/18/2011 [facility] Patient Information 2014 [place].   Emergency Department Resource Guide 1) Find a Doctor and Pay Out of Pocket Although you won't have to find out who is covered by your insurance plan, it is a good idea to ask around and get recommendations. You will then need to call the office and see if the doctor you have chosen will accept you as a new patient and what types of options they offer for patients who are self-pay. Some doctors offer discounts or will set up payment plans for their patients who do not have insurance, but you will need to ask so you aren't surprised when you get to your appointment.  2) Contact Your Local Health Department Not all health departments have doctors that can see patients for sick visits, but many do, so it is worth a call to see if yours does. If you don't know where your  local health department is, you can check in your phone book. The CDC also has a tool to help you locate your state's health department, and many state websites also have listings of all of their local health departments.  3) Find a [facility] If your illness is not likely to be very severe or complicated, you may want to try a walk in clinic. These are popping up all over the country in pharmacies, drugstores, and  shopping centers. They're usually staffed by nurse practitioners or physician assistants that have been trained to treat common illnesses and complaints. They're usually fairly quick and inexpensive. However, if you have serious medical issues or chronic medical problems, these are probably not your best option.  No Primary Care Doctor: - Call Health Connect at  [phone] - they can help you locate a primary care doctor that  accepts your insurance, provides certain services, etc. - Physician Referral Service- [phone]  Chronic Pain Problems: Organization         Address  Phone   Notes  [facility]  [phone] Patients need to be referred by their primary care doctor.   Medication Assistance: Organization         Address  Phone   Notes  [facility] Medication [facility] [address] [place], [place] [address] [phone] --Must be a resident of [facility] -- Must have NO insurance coverage whatsoever (no Medicaid/ Medicare, etc.) -- The pt. MUST have a primary care doctor that directs their care regularly and follows them in the community   MedAssist  [phone]   [employer]  [phone]    Agencies that provide inexpensive medical care: Organization         Address  Phone   Notes  [place]  [phone]   [facility]    [phone]   [facility] [place], [place] [address] [phone]   [place] [address], [place] [phone]   Planned Parenthood    [phone]   [facility]    [phone]   [place] and [place] [address], [place] Phone:  [phone], Fax:  [phone] Hours of Operation:  9 am - 6 pm, M-F.  Also accepts Medicaid/Medicare and self-pay.  [facility] for [place], [address], [place] Phone: [phone],  Fax: [phone]. Hours of Operation:  8:30 am - 5:30 pm, M-F.  Also accepts Medicaid and self-pay.  [facility] [place] [address], [place] Phone: [phone]   [place], [place], [place] [phone], Ext. [phone] Mondays & Thursdays: 7-9 AM.  First 15 patients are seen on a first come, first serve basis.    [place] Providers:  Organization         Address  Phone   Notes  [facility] [address], [place] [phone] Also accepts self-pay patients.  [place], [place]  [phone]   [place], [address], [place] [phone]   [facility] [address], [place] [phone]   Lucianne Lei [address], [place]   [phone] Only accepts [place] Access [place] patients after  they have their name applied to their card.   Self-Pay (no insurance) in [facility]:  Organization         Address  Phone   Notes  Sickle Cell Patients, [facility] [address] [place], [place] [phone]   [facility] [address] [place], [place] [phone]   [facility]  [address], [place] [phone]   [facility]/Dr. Osei-Bonsu  [address] [place] or [address], [place] [phone] Phone number for both [place] and [place] locations is the same.  Urgent Medical and [facility] [address] [place] [phone]   [facility] [address] [place] or [address] [phone]   [facility] [address], [place] [phone], phone; [phone], fax Sees patients 1st and 3rd Saturday of every month.  Must not qualify for public or private  insurance (i.e. Medicaid, Medicare, Advance Health Choice, Veterans' Benefits)  Household income should be no more than 200% of the poverty level The clinic cannot treat you if you are pregnant or think you are pregnant  Sexually transmitted diseases are not treated at the clinic.    Dental Care: Organization         Address  Phone  Notes  [facility] Department of [facility] [address] [place], [place] [phone] Accepts children up to age 54 who are enrolled in [place] or [place] Health Choice; pregnant women with a Medicaid card; and children who have applied for Medicaid or [place] Health Choice, but were declined, whose parents can pay a reduced fee at time of service.  [facility] of [facility]  [address], [place] [phone] Accepts children up to age 74 who are enrolled in [place] or [place] Health Choice; pregnant women with a Medicaid card; and children who have applied for Medicaid or [place] Health Choice, but were declined, whose parents can pay a reduced fee at time of service.  [facility]  [address] [place], [place] [phone] Patients are seen by appointment only. Walk-ins are not accepted. [facility] will see patients 24 years of age and older. Monday - Tuesday (8am-5pm) Most Wednesdays (8:30-5pm) $30 per visit, cash only  [facility]  [address], [facility] [phone] Patients are seen by appointment only. Walk-ins are not accepted. [facility] will see patients 39 years of age and older. One Wednesday Evening (Monthly: Volunteer Based).  $30 per visit, cash only  [employer]  [phone] for adults; Children under age 65, call Graduate Pediatric Dentistry at [phone]. Children aged 53-14, please call [phone] to request a pediatric application.  Dental services are provided in all areas of dental care  including fillings, crowns and bridges, complete and partial dentures, implants, gum treatment, root canals, and extractions. Preventive care is also provided. Treatment is provided to both adults and children. Patients are selected via a lottery and there is often a waiting list.   [facility] [address] [place]  [phone] [email or web address]   [facility] [address] [place], [place] [phone], Ext. [phone] Second and Fourth Thursday of each month, opens at 6:30 AM; Clinic ends at 9 AM.  Patients are  seen on a first-come first-served basis, and a limited number are seen during each clinic.   [facility]  [address] Hillard Danker [place], [place] [phone]   Eligibility Requirements You must have lived in [place], [place], or [place] counties for at least the last three months.   You cannot be eligible for state or federal sponsored [employer], including [employer], [place], or [employer].   You generally cannot be eligible for healthcare insurance through your employer.    How to apply: Eligibility screenings are held every Tuesday and Wednesday afternoon from 1:00 pm until 4:00 pm. You do not need an appointment for the interview!  [facility] [address] [place], [place]   [place]  [place] Department  [place]  [phone]    Behavioral Health Resources in the Community: Intensive Outpatient Programs Organization         Address  Phone  Notes  [facility]. [address], [place], [place] [phone]   [facility] Outpatient [address], [place], [place]   ADS: Alcohol & Drug S[facility] [address] [place], [place]   [place] [address]  [place], [place] or [phone]    Substance Abuse Resources Organization         Address  Phone  Notes  Alcohol and Drug Services  [phone]   [place]  [phone]   [facility] [place]   Chinita Pester  [phone]   Residential & Outpatient Substance Abuse Program  [phone]   Psychological Services Organization         Address  Phone  Notes  [facility] [place]  [place]  [phone]   [place] [address] [place] or [phone]    Mobile Crisis Teams Organization         Address  Phone  Notes  [facility], Mobile Crisis Care Unit  [phone]   [facility]  [address] [place], [place]   [place] [address], [place] [phone]    Self-Help/Support Groups Organization         Address  Phone             Notes  [place]. of [place] - variety of support groups  [place] Call for more information  Narcotics Anonymous (NA), [facility] [address], [employer] [place]  2 meetings at this location   Special educational needs teacher         Address  Phone  Notes  [facility] Residential Treatment [place],    [place]  [phone]   [facility]  [address] [place] [address], [place], [place]   [place], [place] [phone] Admissions: 8am-3pm M-F  Incentives Substance [place] [address],    [place], [place] [phone]   [facility] [address] Jadene Pierini [place], [place]   [facility] [address],  [place], [place]   [facility] - Intensive Outpatient [address], [place], [place] [phone]   [facility].,  [place], [place] or [phone]   Residential Treatment  Services (RTS) [address]  [place], [place] Accepts Medicaid  [facility] [address],  [place] [phone] Substance Abuse/Addiction Treatment   [address] [facility] Organization         Address  Phone  Notes  [facility]  [phone]   Domenic Schwab, PhD [address] [place], [place]   [phone] or [phone]   [place], [place] [phone]   [place] [address], [place], [place] [phone] Insurance/Medicaid/sponsorship through [facility] and Families [address] [place]                                    [place], [place] [phone] [place] [address], [place] [phone]    Dr. Adele Schilder  [phone]   [facility] Dept. 1) [address] [place] 2) [place] 3)  [place] [address], [place] [phone]  [phone]   [place] [phone] or [phone] (After Hours)

## 2013-08-29 NOTE — ED Notes (Signed)
Pt states she noticed Tuesday morning a "boil" under her left armpit. Pt states is has not drained anything, but it has grown and is painful. The abscess slightly red with hardness around the core. Pt put "boil ease" on the abscess and tried hot showers/compresses with no relief. Pt states she has had a fever all week- also had a stomach virus last [place].

## 2014-04-20 ENCOUNTER — Encounter (HOSPITAL_COMMUNITY): Payer: Self-pay | Admitting: Emergency Medicine

## 2014-05-01 ENCOUNTER — Emergency Department (HOSPITAL_COMMUNITY)
Admission: EM | Admit: 2014-05-01 | Discharge: 2014-05-01 | Discharge status: Against Medical Advice | Payer: No Typology Code available for payment source | Attending: Emergency Medicine | Admitting: Emergency Medicine

## 2014-05-01 DIAGNOSIS — Z720 Tobacco use: Secondary | ICD-10-CM | POA: Diagnosis not present

## 2014-05-01 DIAGNOSIS — S80811A Abrasion, right lower leg, initial encounter: Secondary | ICD-10-CM | POA: Insufficient documentation

## 2014-05-01 DIAGNOSIS — Y92410 Unspecified street and highway as the place of occurrence of the external cause: Secondary | ICD-10-CM | POA: Diagnosis not present

## 2014-05-01 DIAGNOSIS — Y9389 Activity, other specified: Secondary | ICD-10-CM | POA: Insufficient documentation

## 2014-05-01 DIAGNOSIS — S0083XA Contusion of other part of head, initial encounter: Secondary | ICD-10-CM | POA: Insufficient documentation

## 2014-05-01 DIAGNOSIS — Y998 Other external cause status: Secondary | ICD-10-CM | POA: Diagnosis not present

## 2014-05-01 DIAGNOSIS — S0990XA Unspecified injury of head, initial encounter: Secondary | ICD-10-CM | POA: Diagnosis present

## 2014-05-01 NOTE — ED Provider Notes (Signed)
Pt left prior to being seen by provider.    Domenic Moras, PA-C 05/01/14 [place], MD 05/01/14 [phone]

## 2014-05-01 NOTE — ED Notes (Signed)
Pt states she does not have time to wait to be seen today.
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
## 2014-05-02 ENCOUNTER — Encounter (HOSPITAL_COMMUNITY): Payer: Self-pay | Admitting: Emergency Medicine

## 2014-05-02 ENCOUNTER — Emergency Department (HOSPITAL_COMMUNITY): Payer: No Typology Code available for payment source

## 2014-05-02 ENCOUNTER — Emergency Department (HOSPITAL_COMMUNITY)
Admission: EM | Admit: 2014-05-02 | Discharge: 2014-05-02 | Disposition: A | Discharge status: Home / Self Care | Payer: No Typology Code available for payment source | Attending: Emergency Medicine | Admitting: Emergency Medicine

## 2014-05-02 DIAGNOSIS — S134XXA Sprain of ligaments of cervical spine, initial encounter: Secondary | ICD-10-CM | POA: Insufficient documentation

## 2014-05-02 DIAGNOSIS — K219 Gastro-esophageal reflux disease without esophagitis: Secondary | ICD-10-CM | POA: Insufficient documentation

## 2014-05-02 DIAGNOSIS — S199XXA Unspecified injury of neck, initial encounter: Secondary | ICD-10-CM | POA: Diagnosis present

## 2014-05-02 DIAGNOSIS — S139XXA Sprain of joints and ligaments of unspecified parts of neck, initial encounter: Secondary | ICD-10-CM

## 2014-05-02 DIAGNOSIS — Y998 Other external cause status: Secondary | ICD-10-CM | POA: Insufficient documentation

## 2014-05-02 DIAGNOSIS — Z8781 Personal history of (healed) traumatic fracture: Secondary | ICD-10-CM | POA: Diagnosis not present

## 2014-05-02 DIAGNOSIS — Y9389 Activity, other specified: Secondary | ICD-10-CM | POA: Diagnosis not present

## 2014-05-02 DIAGNOSIS — S0990XA Unspecified injury of head, initial encounter: Secondary | ICD-10-CM | POA: Insufficient documentation

## 2014-05-02 DIAGNOSIS — M79661 Pain in right lower leg: Secondary | ICD-10-CM

## 2014-05-02 DIAGNOSIS — Z720 Tobacco use: Secondary | ICD-10-CM | POA: Diagnosis not present

## 2014-05-02 DIAGNOSIS — S9001XA Contusion of right ankle, initial encounter: Secondary | ICD-10-CM | POA: Insufficient documentation

## 2014-05-02 DIAGNOSIS — Y92410 Unspecified street and highway as the place of occurrence of the external cause: Secondary | ICD-10-CM | POA: Insufficient documentation

## 2014-05-02 DIAGNOSIS — Z79899 Other long term (current) drug therapy: Secondary | ICD-10-CM | POA: Insufficient documentation

## 2014-05-02 MED ORDER — IBUPROFEN 800 MG PO TABS
800.0000 mg | ORAL_TABLET | Freq: Once | ORAL | Status: AC
  Administered 2014-05-02: 800 mg via ORAL
  Filled 2014-05-02: qty 1

## 2014-05-02 MED ORDER — IBUPROFEN 800 MG PO TABS: 800.0000 mg | ORAL_TABLET | Freq: Three times a day (TID) | ORAL | Status: DC
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
## 2014-05-02 NOTE — Discharge Instructions (Signed)
Cervical Sprain °A cervical sprain is an injury in the neck in which the strong, fibrous tissues (ligaments) that connect your neck bones stretch or tear. Cervical sprains can range from mild to severe. Severe cervical sprains can cause the neck vertebrae to be unstable. This can lead to damage of the spinal cord and can result in serious nervous system problems. The amount of time it takes for a cervical sprain to get better depends on the cause and extent of the injury. Most cervical sprains heal in 1 to 3 weeks. °CAUSES  °Severe cervical sprains may be caused by:  °· Contact sport injuries (such as from football, rugby, wrestling, hockey, auto racing, gymnastics, diving, martial arts, or boxing).   °· Motor vehicle collisions.   °· Whiplash injuries. This is an injury from a sudden forward and backward whipping movement of the head and neck.  °· Falls.   °Mild cervical sprains may be caused by:  °· Being in an awkward position, such as while cradling a telephone between your ear and shoulder.   °· Sitting in a chair that does not offer proper support.   °· Working at a poorly designed computer station.   °· Looking up or down for long periods of time.   °SYMPTOMS  °· Pain, soreness, stiffness, or a burning sensation in the front, back, or sides of the neck. This discomfort may develop immediately after the injury or slowly, 24 hours or more after the injury.   °· Pain or tenderness directly in the middle of the back of the neck.   °· Shoulder or upper back pain.   °· Limited ability to move the neck.   °· Headache.   °· Dizziness.   °· Weakness, numbness, or tingling in the hands or arms.   °· Muscle spasms.   °· Difficulty swallowing or chewing.   °· Tenderness and swelling of the neck.   °DIAGNOSIS  °Most of the time your health care provider can diagnose a cervical sprain by taking your history and doing a physical exam. Your health care provider will ask about previous neck injuries and any known neck  problems, such as arthritis in the neck. X-rays may be taken to find out if there are any other problems, such as with the bones of the neck. Other tests, such as a CT scan or MRI, may also be needed.  °TREATMENT  °Treatment depends on the severity of the cervical sprain. Mild sprains can be treated with rest, keeping the neck in place (immobilization), and pain medicines. Severe cervical sprains are immediately immobilized. Further treatment is done to help with pain, muscle spasms, and other symptoms and may include: °· Medicines, such as pain relievers, numbing medicines, or muscle relaxants.   °· Physical therapy. This may involve stretching exercises, strengthening exercises, and posture training. Exercises and improved posture can help stabilize the neck, strengthen muscles, and help stop symptoms from returning.   °HOME CARE INSTRUCTIONS  °· Put ice on the injured area.   °¨ Put ice in a plastic bag.   °¨ Place a towel between your skin and the bag.   °¨ Leave the ice on for 15-20 minutes, 3-4 times a day.   °· If your injury was severe, you may have been given a cervical collar to wear. A cervical collar is a two-piece collar designed to keep your neck from moving while it heals. °¨ Do not remove the collar unless instructed by your health care provider. °¨ If you have long hair, keep it outside of the collar. °¨ Ask your health care provider before making any adjustments to your collar. Minor   adjustments may be required over time to improve comfort and reduce pressure on your chin or on the back of your head.  Ifyou are allowed to remove the collar for cleaning or bathing, follow your health care provider's instructions on how to do so safely.  Keep your collar clean by wiping it with mild soap and water and drying it completely. If the collar you have been given includes removable pads, remove them every 1-2 days and hand wash them with soap and water. Allow them to air dry. They should be completely  dry before you wear them in the collar.  If you are allowed to remove the collar for cleaning and bathing, wash and dry the skin of your neck. Check your skin for irritation or sores. If you see any, tell your health care provider.  Do not drive while wearing the collar.   Only take over-the-counter or prescription medicines for pain, discomfort, or fever as directed by your health care provider.   Keep all follow-up appointments as directed by your health care provider.   Keep all physical therapy appointments as directed by your health care provider.   Make any needed adjustments to your workstation to promote good posture.   Avoid positions and activities that make your symptoms worse.   Warm up and stretch before being active to help prevent problems.  SEEK MEDICAL CARE IF:   Your pain is not controlled with medicine.   You are unable to decrease your pain medicine over time as planned.   Your activity level is not improving as expected.  SEEK IMMEDIATE MEDICAL CARE IF:   You develop any bleeding.  You develop stomach upset.  You have signs of an allergic reaction to your medicine.   Your symptoms get worse.   You develop new, unexplained symptoms.   You have numbness, tingling, weakness, or paralysis in any part of your body.  MAKE SURE YOU:   Understand these instructions.  Will watch your condition.  Will get help right away if you are not doing well or get worse. Document Released: 04/02/2007 Document Revised: 06/10/2013 Document Reviewed: 12/11/2012 [facility] Patient Information 2015 [place], [place]. This information is not intended to replace advice given to you by your health care provider. Make sure you discuss any questions you have with your health care provider.  Contusion A contusion is a deep bruise. Contusions are the result of an injury that caused bleeding under the skin. The contusion may turn blue, purple, or yellow. Minor injuries  will give you a painless contusion, but more severe contusions may stay painful and swollen for a few weeks.  CAUSES  A contusion is usually caused by a blow, trauma, or direct force to an area of the body. SYMPTOMS   Swelling and redness of the injured area.  Bruising of the injured area.  Tenderness and soreness of the injured area.  Pain. DIAGNOSIS  The diagnosis can be made by taking a history and physical exam. An X-ray, CT scan, or MRI may be needed to determine if there were any associated injuries, such as fractures. TREATMENT  Specific treatment will depend on what area of the body was injured. In general, the best treatment for a contusion is resting, icing, elevating, and applying cold compresses to the injured area. Over-the-counter medicines may also be recommended for pain control. Ask your caregiver what the best treatment is for your contusion. HOME CARE INSTRUCTIONS   Put ice on the injured area.  Put ice  in a plastic bag.  Place a towel between your skin and the bag.  Leave the ice on for 15-20 minutes, 3-4 times a day, or as directed by your health care provider.  Only take over-the-counter or prescription medicines for pain, discomfort, or fever as directed by your caregiver. Your caregiver may recommend avoiding anti-inflammatory medicines (aspirin, ibuprofen, and naproxen) for 48 hours because these medicines may increase bruising.  Rest the injured area.  If possible, elevate the injured area to reduce swelling. SEEK IMMEDIATE MEDICAL CARE IF:   You have increased bruising or swelling.  You have pain that is getting worse.  Your swelling or pain is not relieved with medicines. MAKE SURE YOU:   Understand these instructions.  Will watch your condition.  Will get help right away if you are not doing well or get worse. Document Released: 03/15/2005 Document Revised: 06/10/2013 Document Reviewed: 04/10/2011 [facility] Patient Information 2015 [place],  [place]. This information is not intended to replace advice given to you by your health care provider. Make sure you discuss any questions you have with your health care provider.   Emergency Department Resource Guide 1) Find a Doctor and Pay Out of Pocket Although you won't have to find out who is covered by your insurance plan, it is a good idea to ask around and get recommendations. You will then need to call the office and see if the doctor you have chosen will accept you as a new patient and what types of options they offer for patients who are self-pay. Some doctors offer discounts or will set up payment plans for their patients who do not have insurance, but you will need to ask so you aren't surprised when you get to your appointment.  2) Contact Your Local Health Department Not all health departments have doctors that can see patients for sick visits, but many do, so it is worth a call to see if yours does. If you don't know where your local health department is, you can check in your phone book. The CDC also has a tool to help you locate your state's health department, and many state websites also have listings of all of their local health departments.  3) Find a [facility] If your illness is not likely to be very severe or complicated, you may want to try a walk in clinic. These are popping up all over the country in pharmacies, drugstores, and shopping centers. They're usually staffed by nurse practitioners or physician assistants that have been trained to treat common illnesses and complaints. They're usually fairly quick and inexpensive. However, if you have serious medical issues or chronic medical problems, these are probably not your best option.  No Primary Care Doctor: - Call Health Connect at  [phone] - they can help you locate a primary care doctor that  accepts your insurance, provides certain services, etc. - Physician Referral Service- [phone]  Chronic Pain  Problems: Organization         Address  Phone   Notes  [facility]  [phone] Patients need to be referred by their primary care doctor.   Medication Assistance: Organization         Address  Phone   Notes  [facility] [address] [place], [place] [address] [phone] --Must be a resident of [facility] -- Must have NO insurance coverage whatsoever (no Medicaid/ Medicare, etc.) -- The pt. MUST have a primary care doctor  that directs their care regularly and follows them in the community   [facility]ist  [phone]   [employer]  [phone]    Agencies that provide inexpensive medical care: Organization         Address  Phone   Notes  [place]  [phone]   [facility]    [phone]   [facility] [place], [place] [address] [phone]   [place] [address] [place] [phone]   Planned Parenthood    [phone]   [facility]    [phone]   [place] and [place] [address], [place] Phone:  [phone], Fax:  [phone] Hours of Operation:  9 am - 6 pm, M-F.  Also accepts Medicaid/Medicare and self-pay.  [facility] for [place], [address], [place] Phone: [phone], Fax: [phone]. Hours of Operation:  8:30 am - 5:30 pm, M-F.  Also accepts Medicaid and self-pay.  [facility] [place] [address], [place] Phone: [phone]   [place], [place], [place] [phone], Ext. [phone] Mondays & Thursdays: 7-9 AM.  First 15 patients are seen on a first come, first serve basis.    [place] Providers:  Organization         Address  Phone   Notes  [facility] [address], [place] [phone] Also  accepts self-pay patients.  [facility] [address], [place]  [phone]   [place], [address], [place] [phone]   [facility] [address] [place] [phone]   Lucianne Lei [address], [place]   [phone] Only accepts [place] Access [place] patients after they have their name applied to their card.   Self-Pay (no insurance) in [facility]:  Organization         Address  Phone   Notes  Sickle Cell Patients, [facility] [place] [phone]   [facility] [place] [phone]   [facility] [place]  [place], [place], [place] [phone]   [facility]/Dr. Osei-Bonsu  [address], [address], [place] [phone] Phone number for both [place] and [place] locations is the same.  Urgent Medical and [facility] [address], [place] [phone]   [facility] [address], [place] or [address] [phone]   [facility] [address] [place] [phone], phone; [phone], fax Sees patients 1st and 3rd Saturday of every month.  Must not qualify for public or private insurance (i.e. Medicaid, Medicare, [place] Health Choice, Veterans' Benefits)  Household income should be no more than 200% of the poverty level The clinic cannot treat you if you are pregnant or think you are pregnant  Sexually transmitted diseases are not treated at the clinic.    Dental Care: Organization         Address  Phone  Notes  [facility] [address] [place], [place] [phone] Accepts children up  to age 79 who are enrolled in Medicaid or [place] Health Choice; pregnant  women with a Medicaid card; and children who have applied for Medicaid or [place] Health Choice, but were declined, whose parents can pay a reduced fee at time of service.  [facility]  [address], [place] [phone] Accepts children up to age 41 who are enrolled in [place] or [place]; pregnant women with a Medicaid card; and children who have applied for Medicaid or [place] Health Choice, but were declined, whose parents can pay a reduced fee at time of service.  [employer] Adult Dental Access PROGRAM  [place] [phone] Patients are seen by appointment only. Walk-ins are not accepted. [place] will see patients 88 years of age and older. Monday - Tuesday (8am-5pm) Most Wednesdays (8:30-5pm) $30 per visit, cash only  [facility] Adult [facility] Access PROGRAM  [address], [facility] [phone] Patients are seen by appointment only. Walk-ins are not accepted. [place] will see patients 58 years of age and older. One Wednesday Evening (Monthly: Volunteer Based).  $30 per visit, cash only  [place]  [phone] for adults; Children under age 28, call Graduate Pediatric Dentistry at [phone]. Children aged 76-14, please call [phone] to request a pediatric application.  Dental services are provided in all areas of dental care including fillings, crowns and bridges, complete and partial dentures, implants, gum treatment, root canals, and extractions. Preventive care is also provided. Treatment is provided to both adults and children. Patients are selected via a lottery and there is often a waiting list.   [facility] [address], [place]  [phone] [email or web address]   [facility] [address] [place], [place] [phone], Ext. [phone] Second and Fourth Thursday of each month, opens at 6:30 AM; Clinic ends at 9 AM.  Patients are  seen on a first-come first-served basis, and a limited number are seen during each clinic.   [facility]  [address] Hillard Danker [place], [place] [phone]   Eligibility Requirements You must have lived in [place], [place], or [place] counties for at least the last three months.   You cannot be eligible for state or federal sponsored [employer], including [employer], [place], or [employer].   You generally cannot be eligible for healthcare insurance through your employer.    How to apply: Eligibility screenings are held every Tuesday and Wednesday afternoon from 1:00 pm until 4:00 pm. You do not need an appointment for the interview!  [facility] [address] [place], [place]   [place]  [place] Department  [address]  [phone]    Behavioral Health Resources in the Community: Intensive Outpatient Programs Organization         Address  Phone  Notes  [facility] [place]. [address] [place], [place] [phone]   [facility] Outpatient [address] [place], [place]   ADS: Alcohol [facility] Drug [facility] [address] [place], [place]   [place] [address]  [place], [place] or [phone]   Substance Abuse Resources Organization         Address  Phone  Notes  Alcohol and Drug Services  [phone]   [facility]  [phone]   [facility] [place]   Chinita Pester  [phone]   Residential & Outpatient Substance Abuse Program  [phone]   Psychological Services Organization         Address  Phone  Notes  [facility] [place]  [place]  [phone]   [place] [address], [place] or [phone]    Mobile Crisis  Teams Organization         Address  Phone  Notes  [facility], Mobile Crisis Care Unit  [phone]   [facility]  [address] [place], [place]   [place] [address] [place] [phone]    Self-Help/Support Groups Organization         Address  Phone             Notes  [place]. of [place] - variety of support groups  [place] Call for more information  Narcotics Anonymous (NA), [facility] [address], [employer] [place]  2 meetings at this location   Special educational needs teacher         Address  Phone  Notes  [facility] [place],    [place]  [phone]   [facility]  [address] [place] [address], [place], [place]   [place], [place] [phone] Admissions: 8am-3pm M-F  Incentives Substance [place] [address],    [place], [place] [phone]   [facility] [address] [place], [place], [place]   [facility] [address],  [place], [place]   [facility] - Intensive Outpatient [address], [place], [place]   [facility] ([place].,  [place], [place] [phone] or [phone]   [facility] ([facility]) [address] [place], [place] Accepts Medicaid  [facility] [place] [address]  [place] [phone] Substance Abuse/Addiction Treatment   [facility] Organization         Address  Phone  Notes  [facility]  [phone]   Domenic Schwab, PhD [address] Arlis Porta [place], [place]   [phone] or [phone]   [place] Cooter Kief [place], [place] [phone]   [facility] [address], [place], [place] [phone]  Insurance/Medicaid/sponsorship through [facility] and Families [address], [address] [place]                                    [place], [place] [phone] [place] [address], [place] [phone]    Dr. Adele Schilder  [phone]   [facility] Dept. 1) [address], [place] 2) [place] 3)  [place] [address], [place] [phone]  [phone]   [place] [phone] or [phone] (After Hours)

## 2014-05-03 ENCOUNTER — Emergency Department (HOSPITAL_COMMUNITY)
Admission: EM | Admit: 2014-05-03 | Discharge: 2014-05-03 | Disposition: A | Discharge status: Home / Self Care | Payer: No Typology Code available for payment source | Attending: Emergency Medicine | Admitting: Emergency Medicine

## 2014-05-03 ENCOUNTER — Encounter (HOSPITAL_COMMUNITY): Payer: Self-pay | Admitting: Emergency Medicine

## 2014-05-03 ENCOUNTER — Emergency Department (HOSPITAL_COMMUNITY): Payer: No Typology Code available for payment source

## 2014-05-03 DIAGNOSIS — S29011A Strain of muscle and tendon of front wall of thorax, initial encounter: Secondary | ICD-10-CM | POA: Diagnosis not present

## 2014-05-03 DIAGNOSIS — S299XXA Unspecified injury of thorax, initial encounter: Secondary | ICD-10-CM | POA: Diagnosis present

## 2014-05-03 DIAGNOSIS — Y9389 Activity, other specified: Secondary | ICD-10-CM | POA: Insufficient documentation

## 2014-05-03 DIAGNOSIS — Z8781 Personal history of (healed) traumatic fracture: Secondary | ICD-10-CM | POA: Insufficient documentation

## 2014-05-03 DIAGNOSIS — Z720 Tobacco use: Secondary | ICD-10-CM | POA: Insufficient documentation

## 2014-05-03 DIAGNOSIS — K219 Gastro-esophageal reflux disease without esophagitis: Secondary | ICD-10-CM | POA: Insufficient documentation

## 2014-05-03 DIAGNOSIS — Z79899 Other long term (current) drug therapy: Secondary | ICD-10-CM | POA: Diagnosis not present

## 2014-05-03 DIAGNOSIS — Y92410 Unspecified street and highway as the place of occurrence of the external cause: Secondary | ICD-10-CM | POA: Diagnosis not present

## 2014-05-03 DIAGNOSIS — Y998 Other external cause status: Secondary | ICD-10-CM | POA: Insufficient documentation

## 2014-05-03 DIAGNOSIS — R079 Chest pain, unspecified: Secondary | ICD-10-CM

## 2014-05-03 MED ORDER — HYDROCODONE-ACETAMINOPHEN 5-325 MG PO TABS
2.0000 | ORAL_TABLET | Freq: Once | ORAL | Status: AC
  Administered 2014-05-03: 2 via ORAL
  Filled 2014-05-03: qty 2

## 2014-05-03 MED ORDER — ONDANSETRON 4 MG PO TBDP
4.0000 mg | ORAL_TABLET | Freq: Once | ORAL | Status: AC
  Administered 2014-05-03: 4 mg via ORAL
  Filled 2014-05-03: qty 1

## 2014-05-03 MED ORDER — HYDROCODONE-ACETAMINOPHEN 5-325 MG PO TABS: 1.0000 | ORAL_TABLET | Freq: Four times a day (QID) | ORAL | Status: DC | PRN

## 2014-05-03 NOTE — Discharge Instructions (Signed)
You have been diagnosed by your caregiver as having chest wall pain. SEEK IMMEDIATE MEDICAL ATTENTION IF: You develop a fever.  Your chest pains become severe or intolerable.  You develop new, unexplained symptoms (problems).  You develop shortness of breath, nausea, vomiting, sweating or feel light headed.  You develop a new cough or you cough up blood.   Chest Wall Pain Chest wall pain is pain in or around the bones and muscles of your chest. It may take up to 6 weeks to get better. It may take longer if you must stay physically active in your work and activities.  CAUSES  Chest wall pain may happen on its own. However, it may be caused by:  A viral illness like the flu.  Injury.  Coughing.  Exercise.  Arthritis.  Fibromyalgia.  Shingles. HOME CARE INSTRUCTIONS   Avoid overtiring physical activity. Try not to strain or perform activities that cause pain. This includes any activities using your chest or your abdominal and side muscles, especially if heavy weights are used.  Put ice on the sore area.  Put ice in a plastic bag.  Place a towel between your skin and the bag.  Leave the ice on for 15-20 minutes per hour while awake for the first 2 days.  Only take over-the-counter or prescription medicines for pain, discomfort, or fever as directed by your caregiver. SEEK IMMEDIATE MEDICAL CARE IF:   Your pain increases, or you are very uncomfortable.  You have a fever.  Your chest pain becomes worse.  You have new, unexplained symptoms.  You have nausea or vomiting.  You feel sweaty or lightheaded.  You have a cough with phlegm (sputum), or you cough up blood. MAKE SURE YOU:   Understand these instructions.  Will watch your condition.  Will get help right away if you are not doing well or get worse. Document Released: 06/05/2005 Document Revised: 08/28/2011 Document Reviewed: 01/30/2011 [facility] Patient Information 2015 [place], [place]. This information is  not intended to replace advice given to you by your health care provider. Make sure you discuss any questions you have with your health care provider.
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
## 2014-09-24 ENCOUNTER — Inpatient Hospital Stay (HOSPITAL_COMMUNITY)
Admission: AD | Admit: 2014-09-24 | Discharge: 2014-09-24 | Disposition: A | Discharge status: Home / Self Care | Payer: Medicaid Other | Source: Ambulatory Visit | Attending: Obstetrics & Gynecology | Admitting: Obstetrics & Gynecology

## 2014-09-24 ENCOUNTER — Encounter (HOSPITAL_COMMUNITY): Payer: Self-pay | Admitting: *Deleted

## 2014-09-24 DIAGNOSIS — R102 Pelvic and perineal pain: Secondary | ICD-10-CM | POA: Insufficient documentation

## 2014-09-24 DIAGNOSIS — K219 Gastro-esophageal reflux disease without esophagitis: Secondary | ICD-10-CM | POA: Insufficient documentation

## 2014-09-24 DIAGNOSIS — F17210 Nicotine dependence, cigarettes, uncomplicated: Secondary | ICD-10-CM | POA: Insufficient documentation

## 2014-09-24 LAB — URINALYSIS, ROUTINE W REFLEX MICROSCOPIC
Bilirubin Urine: NEGATIVE
Glucose, UA: NEGATIVE mg/dL
Ketones, ur: NEGATIVE mg/dL
Leukocytes, UA: NEGATIVE
Nitrite: NEGATIVE
Protein, ur: NEGATIVE mg/dL
Specific Gravity, Urine: 1.02 (ref 1.005–1.030)
Urobilinogen, UA: 0.2 mg/dL (ref 0.0–1.0)
pH: 5.5 (ref 5.0–8.0)

## 2014-09-24 LAB — CBC WITH DIFFERENTIAL/PLATELET
Basophils Absolute: 0 10*3/uL (ref 0.0–0.1)
Basophils Relative: 0 % (ref 0–1)
Eosinophils Absolute: 0.1 10*3/uL (ref 0.0–0.7)
Eosinophils Relative: 1 % (ref 0–5)
HCT: 39.6 % (ref 36.0–46.0)
Hemoglobin: 14.6 g/dL (ref 12.0–15.0)
Lymphocytes Relative: 40 % (ref 12–46)
Lymphs Abs: 3.1 10*3/uL (ref 0.7–4.0)
MCH: 31.1 pg (ref 26.0–34.0)
MCHC: 36.9 g/dL — ABNORMAL HIGH (ref 30.0–36.0)
MCV: 84.3 fL (ref 78.0–100.0)
Monocytes Absolute: 0.7 10*3/uL (ref 0.1–1.0)
Monocytes Relative: 9 % (ref 3–12)
Neutro Abs: 3.8 10*3/uL (ref 1.7–7.7)
Neutrophils Relative %: 50 % (ref 43–77)
Platelets: 226 10*3/uL (ref 150–400)
RBC: 4.7 MIL/uL (ref 3.87–5.11)
RDW: 14.9 % (ref 11.5–15.5)
WBC: 7.7 10*3/uL (ref 4.0–10.5)

## 2014-09-24 LAB — WET PREP, GENITAL
Clue Cells Wet Prep HPF POC: NONE SEEN
Trich, Wet Prep: NONE SEEN
Yeast Wet Prep HPF POC: NONE SEEN

## 2014-09-24 LAB — POCT PREGNANCY, URINE: Preg Test, Ur: NEGATIVE

## 2014-09-24 LAB — URINE MICROSCOPIC-ADD ON

## 2014-09-24 MED ORDER — NAPROXEN SODIUM 550 MG PO TABS: 550.0000 mg | ORAL_TABLET | Freq: Two times a day (BID) | ORAL | Status: DC

## 2014-09-24 MED ORDER — KETOROLAC TROMETHAMINE 60 MG/2ML IM SOLN
60.0000 mg | Freq: Once | INTRAMUSCULAR | Status: AC
  Administered 2014-09-24: 60 mg via INTRAMUSCULAR
  Filled 2014-09-24: qty 2

## 2014-09-24 NOTE — MAU Note (Signed)
PT  SAYS SHE STARTED  HURTING  1 MTH  AGO-  IN RIGHT  LOWER  BACK.  YESTERDAY- STARTED  WITH LOWER  ABD  PAIN-    TOOK NO MEDS  FOR PAIN - BUT DRANK ETOH  AND  SMOKED MARIJUANA  .   HAS  NOT BEEN  TO DR  FOR PAIN    BTL- IN 2001.    LAST SEX-   THIS   AM       HAS HX -UTI-   2011-2012-

## 2014-09-24 NOTE — MAU Note (Signed)
Pt reports she has had R back/flank pain off and on for a while. Also has some abd cramping yesterday. reports urinary frequency but no burning.

## 2014-09-24 NOTE — MAU Provider Note (Signed)
CSN: [id number]     Arrival date & time 09/24/14  0930 History   None    Chief Complaint  Patient presents with  . Flank Pain     (Consider location/radiation/quality/duration/timing/severity/associated sxs/prior Treatment) Patient is a 39 y.o. female presenting with flank pain. The history is provided by the patient.  Flank Pain This is a new problem. The current episode started today. The problem occurs constantly. The problem has been gradually worsening. Associated symptoms include abdominal pain and urinary symptoms. Pertinent negatives include no chills, fever, headaches, nausea, neck pain, rash or vomiting. Nothing aggravates the symptoms. She has tried acetaminophen for the symptoms. The treatment provided mild relief.   Kathryn Stevenson is a 39 y.o. [id number] who is not pregnant presents to the MAU with right flank pain that radiates to the RLQ abdomin. The pain started started yesterday with frequent urination, urgency and just not feeling well. She stated that for the past she has had pain off and on in her lower back but the past 2 days has been worse.   Past Medical History  Diagnosis Date  . No pertinent past medical history   . Fracture, ankle   . Acid reflux    Past Surgical History  Procedure Laterality Date  . Tubal ligation    . Ankle surgery    . Induced abortion     Family History  Problem Relation Age of Onset  . Other Neg Hx    History  Substance Use Topics  . Smoking status: Current Every Day Smoker -- 0.50 packs/day    Types: Cigarettes  . Smokeless tobacco: Not on file  . Alcohol Use: Yes     Comment: occas  LAST  TIME   - YESTERDAY-      OB History    Gravida Para Term Preterm AB TAB SAB Ectopic Multiple Living   6 2 2  4 3 1   2      Review of Systems  Constitutional: Negative for fever and chills.  HENT: Negative.   Eyes: Negative for pain, itching and visual disturbance.  Respiratory: Negative.   Cardiovascular: Negative.   Gastrointestinal:  Positive for abdominal pain. Negative for nausea, vomiting and blood in stool.  Genitourinary: Positive for urgency, frequency and flank pain. Negative for vaginal bleeding and vaginal discharge.  Musculoskeletal: Positive for back pain. Negative for neck pain.  Skin: Negative for rash and wound.  Neurological: Negative for seizures, syncope and headaches.  Psychiatric/Behavioral: Negative for confusion. The patient is not nervous/anxious.       Allergies  Review of patient's allergies indicates no known allergies.  Home Medications   Prior to Admission medications   Medication Sig Start Date End Date Taking? Authorizing Provider  acetaminophen (TYLENOL) 650 MG CR tablet Take 650 mg by mouth every 8 (eight) hours as needed for pain.   Yes Historical Provider, MD  ibuprofen (ADVIL,MOTRIN) 800 MG tablet Take 1 tablet (800 mg total) by mouth 3 (three) times daily. 05/02/14  Yes Dahlia Bailiff, PA-C  omeprazole (PRILOSEC) 20 MG capsule Take 20 mg by mouth daily.   Yes Historical Provider, MD  HYDROcodone-acetaminophen (NORCO) 5-325 MG per tablet Take 1 tablet by mouth every 6 (six) hours as needed. Patient not taking: Reported on 09/24/2014 05/03/14   Margarita Mail, PA-C   BP 101/64 mmHg  Pulse 59  Temp(Src) 98.9 F (37.2 C)  Resp 18  Ht 5\' 6"  (1.676 m)  Wt 291 lb (131.997 kg)  BMI 46.99 kg/m2  LMP 09/10/2014 Physical Exam  Constitutional: She is oriented to person, place, and time. She appears well-developed and well-nourished.  HENT:  Head: Normocephalic and atraumatic.  Eyes: EOM are normal.  Neck: Neck supple.  Cardiovascular: Normal rate.   Pulmonary/Chest: Effort normal.  Abdominal: Soft. Bowel sounds are normal. There is tenderness in the suprapubic area and left lower quadrant. There is no rebound, no guarding and no CVA tenderness.  Genitourinary:  External genitalia without lesions. White d/c vaginal vault. No CMT, no adnexal mass palpated. Uterus without palpable  enlargement.   Musculoskeletal: Normal range of motion.  Neurological: She is alert and oriented to person, place, and time. No cranial nerve deficit.  Skin: Skin is warm and dry.  Psychiatric: She has a normal mood and affect. Her behavior is normal.  Nursing note and vitals reviewed.   ED Course  Procedures (including critical care time)  Toradol 60 mg. IM Re assessed after Toradol and pain is completely gone. Discussed with patient ultrasound and she will return for scheduled out patient ultrasound.   Results for orders placed or performed during the hospital encounter of 09/24/14 (from the past 24 hour(s))  Urinalysis, Routine w reflex microscopic     Status: Abnormal   Collection Time: 09/24/14  9:45 AM  Result Value Ref Range   Color, Urine YELLOW YELLOW   APPearance CLEAR CLEAR   Specific Gravity, Urine 1.020 1.005 - 1.030   pH 5.5 5.0 - 8.0   Glucose, UA NEGATIVE NEGATIVE mg/dL   Hgb urine dipstick TRACE (A) NEGATIVE   Bilirubin Urine NEGATIVE NEGATIVE   Ketones, ur NEGATIVE NEGATIVE mg/dL   Protein, ur NEGATIVE NEGATIVE mg/dL   Urobilinogen, UA 0.2 0.0 - 1.0 mg/dL   Nitrite NEGATIVE NEGATIVE   Leukocytes, UA NEGATIVE NEGATIVE  Urine microscopic-add on     Status: Abnormal   Collection Time: 09/24/14  9:45 AM  Result Value Ref Range   Squamous Epithelial / LPF FEW (A) RARE   RBC / HPF 0-2 <3 RBC/hpf   Bacteria, UA RARE RARE  Pregnancy, urine POC     Status: None   Collection Time: 09/24/14  9:55 AM  Result Value Ref Range   Preg Test, Ur NEGATIVE NEGATIVE  Wet prep, genital     Status: Abnormal   Collection Time: 09/24/14 11:26 AM  Result Value Ref Range   Yeast Wet Prep HPF POC NONE SEEN NONE SEEN   Trich, Wet Prep NONE SEEN NONE SEEN   Clue Cells Wet Prep HPF POC NONE SEEN NONE SEEN   WBC, Wet Prep HPF POC FEW (A) NONE SEEN  CBC with Differential/Platelet     Status: Abnormal   Collection Time: 09/24/14 12:13 PM  Result Value Ref Range   WBC 7.7 4.0 - 10.5  K/uL   RBC 4.70 3.87 - 5.11 MIL/uL   Hemoglobin 14.6 12.0 - 15.0 g/dL   HCT 39.6 36.0 - 46.0 %   MCV 84.3 78.0 - 100.0 fL   MCH 31.1 26.0 - 34.0 pg   MCHC 36.9 (H) 30.0 - 36.0 g/dL   RDW 14.9 11.5 - 15.5 %   Platelets 226 150 - 400 K/uL   Neutrophils Relative % 50 43 - 77 %   Lymphocytes Relative 40 12 - 46 %   Monocytes Relative 9 3 - 12 %   Eosinophils Relative 1 0 - 5 %   Basophils Relative 0 0 - 1 %   Neutro Abs 3.8 1.7 - 7.7 K/uL  Lymphs Abs 3.1 0.7 - 4.0 K/uL   Monocytes Absolute 0.7 0.1 - 1.0 K/uL   Eosinophils Absolute 0.1 0.0 - 0.7 K/uL   Basophils Absolute 0.0 0.0 - 0.1 K/uL     MDM  39 y.o. female with abdominal pain stable for d/c without acute abdomen. She will return for out patient u/s . She will returns sooner for problems. Discussed with the patient and all questioned fully answered. She agrees with plan.  Final diagnoses:  Pelvic pain in female

## 2014-09-24 NOTE — Discharge Instructions (Signed)
You will need to follow up for the ultrasound as scheduled.

## 2014-09-25 LAB — RPR: RPR Ser Ql: NONREACTIVE

## 2014-09-25 LAB — HIV ANTIBODY (ROUTINE TESTING W REFLEX): HIV Screen 4th Generation wRfx: NONREACTIVE

## 2014-09-25 LAB — GC/CHLAMYDIA PROBE AMP (~~LOC~~) NOT AT ARMC
Chlamydia: NEGATIVE
Neisseria Gonorrhea: NEGATIVE

## 2014-10-01 ENCOUNTER — Other Ambulatory Visit (HOSPITAL_COMMUNITY): Payer: Self-pay | Admitting: Nurse Practitioner

## 2014-10-01 ENCOUNTER — Ambulatory Visit (HOSPITAL_COMMUNITY)
Admit: 2014-10-01 | Discharge: 2014-10-01 | Disposition: A | Discharge status: Home / Self Care | Payer: Medicaid Other | Attending: Obstetrics & Gynecology | Admitting: Obstetrics & Gynecology

## 2014-10-01 ENCOUNTER — Telehealth: Payer: Self-pay | Admitting: *Deleted

## 2014-10-01 DIAGNOSIS — R102 Pelvic and perineal pain: Secondary | ICD-10-CM

## 2014-10-01 NOTE — Telephone Encounter (Signed)
Attempted to contact patient, no answer, left message for patient that ultrasound results were normal..

## 2014-12-05 ENCOUNTER — Encounter (HOSPITAL_COMMUNITY): Payer: Self-pay | Admitting: *Deleted

## 2014-12-05 ENCOUNTER — Emergency Department (HOSPITAL_COMMUNITY)
Admission: EM | Admit: 2014-12-05 | Discharge: 2014-12-05 | Disposition: A | Discharge status: Home / Self Care | Payer: Medicaid Other | Attending: Emergency Medicine | Admitting: Emergency Medicine

## 2014-12-05 DIAGNOSIS — Z720 Tobacco use: Secondary | ICD-10-CM | POA: Insufficient documentation

## 2014-12-05 DIAGNOSIS — R110 Nausea: Secondary | ICD-10-CM | POA: Insufficient documentation

## 2014-12-05 DIAGNOSIS — Z791 Long term (current) use of non-steroidal anti-inflammatories (NSAID): Secondary | ICD-10-CM | POA: Insufficient documentation

## 2014-12-05 DIAGNOSIS — R509 Fever, unspecified: Secondary | ICD-10-CM | POA: Insufficient documentation

## 2014-12-05 DIAGNOSIS — L02411 Cutaneous abscess of right axilla: Secondary | ICD-10-CM | POA: Insufficient documentation

## 2014-12-05 DIAGNOSIS — K219 Gastro-esophageal reflux disease without esophagitis: Secondary | ICD-10-CM | POA: Insufficient documentation

## 2014-12-05 MED ORDER — LIDOCAINE-EPINEPHRINE 2 %-1:100000 IJ SOLN
20.0000 mL | Freq: Once | INTRAMUSCULAR | Status: AC
  Administered 2014-12-05: 20 mL via INTRADERMAL
  Filled 2014-12-05: qty 1

## 2014-12-05 MED ORDER — CLINDAMYCIN HCL 150 MG PO CAPS: 450.0000 mg | ORAL_CAPSULE | Freq: Three times a day (TID) | ORAL | Status: DC

## 2014-12-05 MED ORDER — HYDROMORPHONE HCL 1 MG/ML IJ SOLN
1.0000 mg | Freq: Once | INTRAMUSCULAR | Status: AC
  Administered 2014-12-05: 1 mg via INTRAMUSCULAR
  Filled 2014-12-05: qty 1

## 2014-12-05 NOTE — ED Provider Notes (Signed)
CSN: [id number]     Arrival date & time 12/05/14  1884 History   First MD Initiated Contact with Patient 12/05/14 0356     Chief Complaint  Patient presents with  . Cyst  . Fever  . Nausea     (Consider location/radiation/quality/duration/timing/severity/associated sxs/prior Treatment) Patient is a 39 y.o. female presenting with abscess.  Abscess Location:  Shoulder/arm Shoulder/arm abscess location:  R axilla Size:  4cm Abscess quality: fluctuance, induration, painful and redness   Duration:  1 week Progression:  Worsening Pain details:    Quality:  Pressure   Severity:  Severe   Duration:  1 week   Timing:  Constant   Progression:  Worsening Chronicity:  Recurrent Context comment:  Previous boils Relieved by:  Nothing Exacerbated by: touching, moving arm. Associated symptoms: fever (low grade)   Associated symptoms: no nausea and no vomiting     Past Medical History  Diagnosis Date  . No pertinent past medical history   . Fracture, ankle   . Acid reflux    Past Surgical History  Procedure Laterality Date  . Tubal ligation    . Ankle surgery    . Induced abortion     Family History  Problem Relation Age of Onset  . Other Neg Hx    History  Substance Use Topics  . Smoking status: Current Every Day Smoker -- 0.50 packs/day    Types: Cigarettes  . Smokeless tobacco: Not on file  . Alcohol Use: Yes     Comment: occas  LAST  TIME   - YESTERDAY-      OB History    Gravida Para Term Preterm AB TAB SAB Ectopic Multiple Living   6 2 2  4 3 1   2      Review of Systems  Constitutional: Positive for fever (low grade).  Gastrointestinal: Negative for nausea and vomiting.  All other systems reviewed and are negative.     Allergies  Review of patient's allergies indicates no known allergies.  Home Medications   Prior to Admission medications   Medication Sig Start Date End Date Taking? Authorizing Provider  aspirin EC 81 MG tablet Take 162 mg by mouth  once.   Yes Historical Provider, MD  omeprazole (PRILOSEC) 20 MG capsule Take 20 mg by mouth daily as needed. For acid reflux   Yes Historical Provider, MD  acetaminophen (TYLENOL) 650 MG CR tablet Take 650 mg by mouth every 8 (eight) hours as needed for pain.    Historical Provider, MD  HYDROcodone-acetaminophen (NORCO) 5-325 MG per tablet Take 1 tablet by mouth every 6 (six) hours as needed. Patient not taking: Reported on 09/24/2014 05/03/14   Margarita Mail, PA-C  ibuprofen (ADVIL,MOTRIN) 800 MG tablet Take 1 tablet (800 mg total) by mouth 3 (three) times daily. Patient not taking: Reported on 12/05/2014 05/02/14   Dahlia Bailiff, PA-C  naproxen sodium (ANAPROX DS) 550 MG tablet Take 1 tablet (550 mg total) by mouth 2 (two) times daily with a meal. Patient not taking: Reported on 12/05/2014 09/24/14   Ashley Murrain, NP   BP 96/54 mmHg  Pulse 83  Temp(Src) 99.5 F (37.5 C) (Oral)  Resp 16  Ht 5\' 6"  (1.676 m)  Wt 279 lb (126.554 kg)  BMI 45.05 kg/m2  SpO2 99%  LMP 11/25/2014 Physical Exam  Constitutional: She is oriented to person, place, and time. She appears well-developed and well-nourished. No distress.  HENT:  Head: Normocephalic and atraumatic.  Eyes: Conjunctivae are normal.  No scleral icterus.  Neck: Neck supple.  Cardiovascular: Normal rate and intact distal pulses.   Pulmonary/Chest: Effort normal. No stridor. No respiratory distress.  Abdominal: Normal appearance. She exhibits no distension.  Musculoskeletal:  Large tender mass in right axilla with fluctuance and moderate surrounding erythema.  Neurological: She is alert and oriented to person, place, and time.  Skin: Skin is warm and dry. No rash noted.  Psychiatric: She has a normal mood and affect. Her behavior is normal.  Nursing note and vitals reviewed.   ED Course  INCISION AND DRAINAGE Date/Time: 12/05/2014 6:51 AM Performed by: Serita Grit Authorized by: Serita Grit Consent: Verbal consent obtained. Risks and  benefits: risks, benefits and alternatives were discussed Consent given by: patient Type: abscess Body area: upper extremity (right axilla) Anesthesia: local infiltration Local anesthetic: lidocaine 2% with epinephrine Anesthetic total: 10 ml Scalpel size: 11 Incision type: single straight Complexity: complex Drainage: purulent Drainage amount: copious Wound treatment: wound left open Packing material: 1/4 in iodoform gauze Patient tolerance: Patient tolerated the procedure well with no immediate complications Comments: Massive amount of purulent drainage.  Required 3 incisions for adequate drainage.     (including critical care time)  EMERGENCY DEPARTMENT US SOFT TISSUE INTERPRETATION "Study: Limited Soft Tissue Ultrasound"  INDICATIONS: Pain, Soft tissue infection and Other (refer to comments) evaluation of surrounding vascular structures. Multiple views of the body part were obtained in real-time with a multi-frequency linear probe PERFORMED BY:  Myself IMAGES ARCHIVED?: Yes SIDE:Right  BODY PART:Axilla FINDINGS: Abcess present INTERPRETATION:  Abcess present     Labs Review Labs Reviewed - No data to display  Imaging Review No results found.   EKG Interpretation None      MDM   Final diagnoses:  Abscess of right axilla    40 yo female with large right axillary abscess.  Pain controlled, then abscess drained with return of large volume of purulence.  Due to size, location, and surrounding erythema, have placed on Clindamycin.  She will fu in 2 days for wound check.      Serita Grit, MD 12/05/14 [phone]

## 2014-12-05 NOTE — ED Notes (Signed)
Pt states she has a cyst under right arm, and has had fevers, nausea ,chills this week,  States has had cyst drained in past

## 2014-12-05 NOTE — ED Notes (Signed)
Dr. Wofford at bedside 

## 2014-12-05 NOTE — Discharge Instructions (Signed)

## 2014-12-08 ENCOUNTER — Emergency Department (HOSPITAL_COMMUNITY)
Admission: EM | Admit: 2014-12-08 | Discharge: 2014-12-08 | Disposition: A | Discharge status: Home / Self Care | Payer: Medicaid Other | Attending: Emergency Medicine | Admitting: Emergency Medicine

## 2014-12-08 ENCOUNTER — Encounter (HOSPITAL_COMMUNITY): Payer: Self-pay | Admitting: Emergency Medicine

## 2014-12-08 DIAGNOSIS — Z791 Long term (current) use of non-steroidal anti-inflammatories (NSAID): Secondary | ICD-10-CM | POA: Insufficient documentation

## 2014-12-08 DIAGNOSIS — Z792 Long term (current) use of antibiotics: Secondary | ICD-10-CM | POA: Insufficient documentation

## 2014-12-08 DIAGNOSIS — Z79899 Other long term (current) drug therapy: Secondary | ICD-10-CM | POA: Insufficient documentation

## 2014-12-08 DIAGNOSIS — Z8781 Personal history of (healed) traumatic fracture: Secondary | ICD-10-CM | POA: Insufficient documentation

## 2014-12-08 DIAGNOSIS — Z8719 Personal history of other diseases of the digestive system: Secondary | ICD-10-CM | POA: Insufficient documentation

## 2014-12-08 DIAGNOSIS — L02411 Cutaneous abscess of right axilla: Secondary | ICD-10-CM

## 2014-12-08 DIAGNOSIS — Z720 Tobacco use: Secondary | ICD-10-CM | POA: Insufficient documentation

## 2014-12-08 DIAGNOSIS — Z7982 Long term (current) use of aspirin: Secondary | ICD-10-CM | POA: Insufficient documentation

## 2014-12-08 MED ORDER — SULFAMETHOXAZOLE-TRIMETHOPRIM 800-160 MG PO TABS: 1.0000 | ORAL_TABLET | Freq: Two times a day (BID) | ORAL | Status: AC

## 2014-12-08 MED ORDER — CEPHALEXIN 500 MG PO CAPS: 500.0000 mg | ORAL_CAPSULE | Freq: Four times a day (QID) | ORAL | Status: DC

## 2014-12-08 NOTE — Discharge Instructions (Signed)
Take keflex and bactrim as prescribed until all gone. Warm compresses or soaks. If packing comes out, leave it out and dont try to put it back in. Follow up as needed. Pull packing on your own in 2 more days or return here for recheck.    Abscess Care After An abscess (also called a boil or furuncle) is an infected area that contains a collection of pus. Signs and symptoms of an abscess include pain, tenderness, redness, or hardness, or you may feel a moveable soft area under your skin. An abscess can occur anywhere in the body. The infection may spread to surrounding tissues causing cellulitis. A cut (incision) by the surgeon was made over your abscess and the pus was drained out. Gauze may have been packed into the space to provide a drain that will allow the cavity to heal from the inside outwards. The boil may be painful for 5 to 7 days. Most people with a boil do not have high fevers. Your abscess, if seen early, may not have localized, and may not have been lanced. If not, another appointment may be required for this if it does not get better on its own or with medications. HOME CARE INSTRUCTIONS   Only take over-the-counter or prescription medicines for pain, discomfort, or fever as directed by your caregiver.  When you bathe, soak and then remove gauze or iodoform packs at least daily or as directed by your caregiver. You may then wash the wound gently with mild soapy water. Repack with gauze or do as your caregiver directs. SEEK IMMEDIATE MEDICAL CARE IF:   You develop increased pain, swelling, redness, drainage, or bleeding in the wound site.  You develop signs of generalized infection including muscle aches, chills, fever, or a general ill feeling.  An oral temperature above 102 F (38.9 C) develops, not controlled by medication. See your caregiver for a recheck if you develop any of the symptoms described above. If medications (antibiotics) were prescribed, take them as  directed. Document Released: 12/22/2004 Document Revised: 08/28/2011 Document Reviewed: 08/19/2007 [facility] Patient Information 2015 [place], [place]. This information is not intended to replace advice given to you by your health care provider. Make sure you discuss any questions you have with your health care provider.

## 2014-12-08 NOTE — ED Notes (Signed)
Pt had R arm abscess lanced on Saturday with packing insertion. Here to have packing removed. Pt reports wound is getting better. Also needs prescription for different abx, the one prescribed Saturday cost too much.

## 2014-12-08 NOTE — ED Provider Notes (Signed)
CSN: [id number]     Arrival date & time 12/08/14  1856 History   First MD Initiated Contact with Patient 12/08/14 1006     Chief Complaint  Patient presents with  . Wound Check     (Consider location/radiation/quality/duration/timing/severity/associated sxs/prior Treatment) HPI Kathryn Stevenson is a 39 y.o. female with no medical problems, presents to ED for wound recheck. States was here 3 days ago and had right axillary abscess incised and drained. Here for recheck. Abscess still draining. Pain much improved. States has to change gauze every hour because of the amount of drainage. Pt states she could not fill clindamycin which she was prescribed because it was $70. She has not been doing any specific treatments on it. Denies fever, chills, malaise.   Past Medical History  Diagnosis Date  . No pertinent past medical history   . Fracture, ankle   . Acid reflux    Past Surgical History  Procedure Laterality Date  . Tubal ligation    . Ankle surgery    . Induced abortion     Family History  Problem Relation Age of Onset  . Other Neg Hx    History  Substance Use Topics  . Smoking status: Current Every Day Smoker -- 0.50 packs/day    Types: Cigarettes  . Smokeless tobacco: Not on file  . Alcohol Use: Yes     Comment: occas  LAST  TIME   - YESTERDAY-      OB History    Gravida Para Term Preterm AB TAB SAB Ectopic Multiple Living   6 2 2  4 3 1   2      Review of Systems  Constitutional: Negative for fever and chills.  Skin: Positive for wound.  Neurological: Negative for weakness and headaches.      Allergies  Review of patient's allergies indicates no known allergies.  Home Medications   Prior to Admission medications   Medication Sig Start Date End Date Taking? Authorizing Provider  acetaminophen (TYLENOL) 650 MG CR tablet Take 650 mg by mouth every 8 (eight) hours as needed for pain.    Historical Provider, MD  aspirin EC 81 MG tablet Take 162 mg by mouth once.     Historical Provider, MD  clindamycin (CLEOCIN) 150 MG capsule Take 3 capsules (450 mg total) by mouth 3 (three) times daily. 12/05/14   Serita Grit, MD  HYDROcodone-acetaminophen (NORCO) 5-325 MG per tablet Take 1 tablet by mouth every 6 (six) hours as needed. Patient not taking: Reported on 09/24/2014 05/03/14   Margarita Mail, PA-C  ibuprofen (ADVIL,MOTRIN) 800 MG tablet Take 1 tablet (800 mg total) by mouth 3 (three) times daily. Patient not taking: Reported on 12/05/2014 05/02/14   Dahlia Bailiff, PA-C  naproxen sodium (ANAPROX DS) 550 MG tablet Take 1 tablet (550 mg total) by mouth 2 (two) times daily with a meal. Patient not taking: Reported on 12/05/2014 09/24/14   Ashley Murrain, NP  omeprazole (PRILOSEC) 20 MG capsule Take 20 mg by mouth daily as needed. For acid reflux    Historical Provider, MD   BP 114/73 mmHg  Pulse 58  Temp(Src) 97.5 F (36.4 C) (Oral)  Resp 16  SpO2 98%  LMP 11/25/2014 Physical Exam  Constitutional: She appears well-developed and well-nourished. No distress.  Eyes: Conjunctivae are normal.  Neck: Neck supple.  Neurological: She is alert.  Skin: Skin is warm and dry.  Abscess to the right axilla with packing intact, draining purulent drainage. No surrounding cellulitic  changes. Mild ttp. Abscess measuring about 6cm diameter with still some induration present.   Nursing note and vitals reviewed.   ED Course  Procedures (including critical care time) Labs Review Labs Reviewed - No data to display  Imaging Review No results found.   EKG Interpretation None      MDM   Final diagnoses:  Abscess of right axilla    patient is here for wound recheck, abscess drained 3 days ago. Pain much improved, no cellulitic changes around the abscess. Abscess however is quite large, and his persistently draining purulent drainage. I was able to express some of the drainage, will leave packing in, will bring patient back in 2 more days. Advised to do warm compresses at home  and will prescribe Keflex and Bactrim for antibiotics. Return precautions discussed.   Filed Vitals:   12/08/14 0958  BP: 114/73  Pulse: 58  Temp: 97.5 F (36.4 C)  TempSrc: Oral  Resp: 16  SpO2: 98%     Jeannett Senior, PA-C 12/08/14 [place], MD 12/09/14 [phone]

## 2015-01-06 ENCOUNTER — Encounter (HOSPITAL_COMMUNITY): Payer: Self-pay | Admitting: *Deleted

## 2015-01-06 ENCOUNTER — Inpatient Hospital Stay (HOSPITAL_COMMUNITY)
Admission: AD | Admit: 2015-01-06 | Discharge: 2015-01-06 | Discharge status: Against Medical Advice | Payer: Medicaid Other | Source: Ambulatory Visit | Attending: Obstetrics and Gynecology | Admitting: Obstetrics and Gynecology

## 2015-01-06 DIAGNOSIS — K219 Gastro-esophageal reflux disease without esophagitis: Secondary | ICD-10-CM | POA: Insufficient documentation

## 2015-01-06 DIAGNOSIS — R109 Unspecified abdominal pain: Secondary | ICD-10-CM | POA: Insufficient documentation

## 2015-01-06 DIAGNOSIS — O0001 Abdominal pregnancy with intrauterine pregnancy: Secondary | ICD-10-CM

## 2015-01-06 DIAGNOSIS — Z3202 Encounter for pregnancy test, result negative: Secondary | ICD-10-CM | POA: Insufficient documentation

## 2015-01-06 DIAGNOSIS — F17210 Nicotine dependence, cigarettes, uncomplicated: Secondary | ICD-10-CM | POA: Insufficient documentation

## 2015-01-06 LAB — URINALYSIS, ROUTINE W REFLEX MICROSCOPIC
Bilirubin Urine: NEGATIVE
Glucose, UA: NEGATIVE mg/dL
Hgb urine dipstick: NEGATIVE
Ketones, ur: NEGATIVE mg/dL
Leukocytes, UA: NEGATIVE
Nitrite: NEGATIVE
Protein, ur: NEGATIVE mg/dL
Specific Gravity, Urine: 1.02 (ref 1.005–1.030)
Urobilinogen, UA: 0.2 mg/dL (ref 0.0–1.0)
pH: 7.5 (ref 5.0–8.0)

## 2015-01-06 LAB — POCT PREGNANCY, URINE: Preg Test, Ur: NEGATIVE

## 2015-01-06 MED ORDER — MECLIZINE HCL 25 MG PO TABS
25.0000 mg | ORAL_TABLET | Freq: Once | ORAL | Status: DC
  Filled 2015-01-06: qty 1

## 2015-01-06 NOTE — MAU Note (Signed)
Abdominal pain, nausea and vomiting for 4 or 5 days LMP 12/21/14

## 2015-01-06 NOTE — MAU Provider Note (Signed)
History     CSN: [id number]  Arrival date and time: 01/06/15 1553   None     Chief Complaint  Patient presents with  . Nausea  . 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 No pertinent past medical history   . Fracture, ankle   . Acid reflux     Past Surgical History  Procedure Laterality Date  . Tubal ligation    . Ankle surgery    . Induced abortion      Family History  Problem Relation Age of Onset  . Other Neg Hx     History  Substance Use Topics  . Smoking status: Current Every Day Smoker -- 0.50 packs/day    Types: Cigarettes  . Smokeless tobacco: Not on file  . Alcohol Use: Yes     Comment: occas  LAST  TIME   - YESTERDAY-       Allergies: No Known Allergies  Prescriptions prior to admission  Medication Sig Dispense Refill Last Dose  . acetaminophen (TYLENOL) 650 MG CR tablet Take 650 mg by mouth every 8 (eight) hours as needed for pain.   unknown at unknown  . aspirin EC 81 MG tablet Take 162 mg by mouth once.   12/05/2014 at Unknown time  . cephALEXin (KEFLEX) 500 MG capsule Take 1 capsule (500 mg total) by mouth 4 (four) times daily. 28 capsule 0   . clindamycin (CLEOCIN) 150 MG capsule Take 3 capsules (450 mg total) by mouth 3 (three) times daily. 90 capsule 0   . HYDROcodone-acetaminophen (NORCO) 5-325 MG per tablet Take 1 tablet by mouth every 6 (six) hours as needed. (Patient not taking: Reported on 09/24/2014) 10 tablet 0 More than a month at Unknown time  . ibuprofen (ADVIL,MOTRIN) 800  MG tablet Take 1 tablet (800 mg total) by mouth 3 (three) times daily. (Patient not taking: Reported on 12/05/2014) 21 tablet 0 Past Month at Unknown time  . naproxen sodium (ANAPROX DS) 550 MG tablet Take 1 tablet (550 mg total) by mouth 2 (two) times daily with a meal. (Patient not taking: Reported on 12/05/2014) 20 tablet 0   . omeprazole (PRILOSEC) 20 MG capsule Take 20 mg by mouth daily as needed. For acid reflux   Past Month at Unknown time    Review of Systems  Constitutional: Negative for fever and chills.  Gastrointestinal: Positive for nausea, vomiting and abdominal pain. Negative for diarrhea and constipation.  Genitourinary: Negative for dysuria and urgency.  Neurological: Negative for headaches.   Physical Exam   Blood pressure 118/72, pulse 64, temperature 99.1 F (37.3 C), temperature source Oral, resp. rate 18, height 5\' 6"  (1.676 m), weight 289 lb 9.6 oz (131.362 kg), last menstrual period 12/21/2014.  Physical Exam  Nursing note and vitals reviewed. Constitutional: She is oriented to person, place, and time. She appears well-developed and well-nourished. No distress.  HENT:  Head: Normocephalic.  Eyes: Pupils are equal, round, and reactive to light.  Neck: Normal range of motion.  Cardiovascular: Normal rate.   Respiratory: Effort normal.  Musculoskeletal: Normal range of motion.  Neurological: She is alert and oriented to person, place, and time.  Skin: Skin is warm and dry.  Psychiatric: She has a normal mood and affect.    MAU Course  Procedures  Informed of negative pregnancy test- pt and partner happy Offered meclezine for nausea Pt left AMA   Assessment and Plan  Neg pregnancy test abd pain  LINEBERRY,SUSAN 01/06/2015, 4:32 PM

## 2015-06-24 ENCOUNTER — Emergency Department (HOSPITAL_COMMUNITY): Payer: Medicaid Other

## 2015-06-24 ENCOUNTER — Encounter (HOSPITAL_COMMUNITY): Payer: Self-pay | Admitting: Neurology

## 2015-06-24 ENCOUNTER — Emergency Department (HOSPITAL_COMMUNITY)
Admission: EM | Admit: 2015-06-24 | Discharge: 2015-06-24 | Disposition: A | Discharge status: Home / Self Care | Payer: Medicaid Other | Attending: Emergency Medicine | Admitting: Emergency Medicine

## 2015-06-24 DIAGNOSIS — F17210 Nicotine dependence, cigarettes, uncomplicated: Secondary | ICD-10-CM | POA: Insufficient documentation

## 2015-06-24 DIAGNOSIS — Z8781 Personal history of (healed) traumatic fracture: Secondary | ICD-10-CM | POA: Insufficient documentation

## 2015-06-24 DIAGNOSIS — R197 Diarrhea, unspecified: Secondary | ICD-10-CM | POA: Insufficient documentation

## 2015-06-24 DIAGNOSIS — K219 Gastro-esophageal reflux disease without esophagitis: Secondary | ICD-10-CM | POA: Insufficient documentation

## 2015-06-24 DIAGNOSIS — R001 Bradycardia, unspecified: Secondary | ICD-10-CM | POA: Insufficient documentation

## 2015-06-24 LAB — I-STAT TROPONIN, ED: Troponin i, poc: 0 ng/mL (ref 0.00–0.08)

## 2015-06-24 LAB — CBC
HCT: 35.9 % — ABNORMAL LOW (ref 36.0–46.0)
Hemoglobin: 12.7 g/dL (ref 12.0–15.0)
MCH: 30.2 pg (ref 26.0–34.0)
MCHC: 35.4 g/dL (ref 30.0–36.0)
MCV: 85.3 fL (ref 78.0–100.0)
Platelets: 287 10*3/uL (ref 150–400)
RBC: 4.21 MIL/uL (ref 3.87–5.11)
RDW: 14.1 % (ref 11.5–15.5)
WBC: 5.5 10*3/uL (ref 4.0–10.5)

## 2015-06-24 LAB — BASIC METABOLIC PANEL
Anion gap: 9 (ref 5–15)
BUN: 6 mg/dL (ref 6–20)
CO2: 23 mmol/L (ref 22–32)
Calcium: 8.7 mg/dL — ABNORMAL LOW (ref 8.9–10.3)
Chloride: 108 mmol/L (ref 101–111)
Creatinine, Ser: 0.89 mg/dL (ref 0.44–1.00)
GFR calc Af Amer: >60 mL/min (ref >60)
GFR calc non Af Amer: >60 mL/min (ref >60)
Glucose, Bld: 95 mg/dL (ref 65–99)
Potassium: 4 mmol/L (ref 3.5–5.1)
Sodium: 140 mmol/L (ref 135–145)

## 2015-06-24 MED ORDER — ONDANSETRON HCL 4 MG/2ML IJ SOLN
4.0000 mg | Freq: Once | INTRAMUSCULAR | Status: AC
  Administered 2015-06-24: 4 mg via INTRAVENOUS
  Filled 2015-06-24: qty 2

## 2015-06-24 MED ORDER — FAMOTIDINE IN NACL 20-0.9 MG/50ML-% IV SOLN
20.0000 mg | Freq: Once | INTRAVENOUS | Status: AC
  Administered 2015-06-24: 20 mg via INTRAVENOUS
  Filled 2015-06-24: qty 50

## 2015-06-24 MED ORDER — GI COCKTAIL ~~LOC~~
30.0000 mL | Freq: Once | ORAL | Status: AC
  Administered 2015-06-24: 30 mL via ORAL
  Filled 2015-06-24: qty 30

## 2015-06-24 MED ORDER — FAMOTIDINE 20 MG PO TABS: 20.0000 mg | ORAL_TABLET | Freq: Every day | ORAL | Status: DC

## 2015-06-24 NOTE — ED Provider Notes (Signed)
CSN: [id number]     Arrival date & time 06/24/15  0810 History   First MD Initiated Contact with Patient 06/24/15 1040     Chief Complaint  Patient presents with  . Chest Pain   (Consider location/radiation/quality/duration/timing/severity/associated sxs/prior Treatment) HPI Kathryn Stevenson is a 40 year old female with a history of GERD who presents with intermittent epigastric dominant pain and burning sternal chest pain 1 week that is requiring more antacid medication to alleviate. She reports that these episodes last for approximately 1 hour. She reports 4 episodes of vomiting in the past week without blood and several episodes of diarrhea. She also reports that she has been taking a generic liquid antacid. She denies any family cardiac history. She states that she smokes half a pack a day. Denies any recent travel. She denies any recent illness, fever, chills, shortness of breath, abdominal pain, pain with inspiration, constipation.   Past Medical History  Diagnosis Date  . No pertinent past medical history   . Fracture, ankle   . Acid reflux    Past Surgical History  Procedure Laterality Date  . Tubal ligation    . Ankle surgery    . Induced abortion     Family History  Problem Relation Age of Onset  . Other Neg Hx    Social History  Substance Use Topics  . Smoking status: Current Every Day Smoker -- 0.25 packs/day    Types: Cigarettes  . Smokeless tobacco: None  . Alcohol Use: Yes     Comment: occas     OB History    Gravida Para Term Preterm AB TAB SAB Ectopic Multiple Living   6 2 2  4 3 1   2      Review of Systems  Constitutional: Negative for fever and chills.  Respiratory: Negative for shortness of breath.   Cardiovascular: Positive for chest pain.  Gastrointestinal: Positive for nausea, vomiting, abdominal pain and diarrhea. Negative for constipation and blood in stool.  All other systems reviewed and are negative.     Allergies  Review of patient's  allergies indicates no known allergies.  Home Medications   Prior to Admission medications   Medication Sig Start Date End Date Taking? Authorizing Provider  acetaminophen (TYLENOL) 650 MG CR tablet Take 650 mg by mouth every 8 (eight) hours as needed for pain.   Yes Historical Provider, MD  omeprazole (PRILOSEC) 20 MG capsule Take 20 mg by mouth daily as needed. For acid reflux   Yes Historical Provider, MD  OVER THE COUNTER MEDICATION Take 1 Dose by mouth daily. OTC off brand acid reducer liquid   Yes Historical Provider, MD  famotidine (PEPCID) 20 MG tablet Take 1 tablet (20 mg total) by mouth daily. 06/24/15   Hanna Patel-Mills, PA-C   BP 103/72 mmHg  Pulse 47  Temp(Src) 98.3 F (36.8 C) (Oral)  Resp 13  Ht 5\' 6"  (1.676 m)  Wt 127.007 kg  BMI 45.21 kg/m2  SpO2 99%  LMP 06/19/2014 Physical Exam  Constitutional: She is oriented to person, place, and time. She appears well-developed and well-nourished.  HENT:  Head: Normocephalic and atraumatic.  Eyes: Conjunctivae are normal.  Neck: Normal range of motion. Neck supple.  Cardiovascular: Regular rhythm and normal heart sounds.  Bradycardia present.   Bradycardia. No murmur.  Pulmonary/Chest: Effort normal and breath sounds normal.  Lungs clear to auscultation bilaterally. No wheezing or decreased breath sounds.  Abdominal: Soft. She exhibits no distension. There is tenderness in the epigastric area. There  is no rebound, no guarding and no CVA tenderness.  Reproducible epigastric tenderness to palpation. No guarding or rebound. Morbidly obese.  Musculoskeletal: Normal range of motion.  Neurological: She is alert and oriented to person, place, and time.  Skin: Skin is warm and dry.  Nursing note and vitals reviewed.   ED Course  Procedures (including critical care time) Labs Review Labs Reviewed  BASIC METABOLIC PANEL - Abnormal; Notable for the following:    Calcium 8.7 (*)    All other components within normal limits  CBC  - Abnormal; Notable for the following:    HCT 35.9 (*)    All other components within normal limits  I-STAT TROPOININ, ED    Imaging Review Dg Chest 2 View  06/24/2015  CLINICAL DATA:  Three days of cough and shortness of breath associated with chest pain. , current smoker EXAM: CHEST  2 VIEW COMPARISON:  PA and lateral chest x-ray of May 03, 2014 FINDINGS: The lungs are adequately inflated and clear. The heart and pulmonary vascularity are normal. The mediastinum is normal in width. There is no pleural effusion. The bony thorax exhibits no acute abnormality. IMPRESSION: There is no active cardiopulmonary disease. Electronically Signed   By: David  Martinique M.D.   On: 06/24/2015 08:49   I have personally reviewed and evaluated these images and lab results as part of my medical decision-making.   EKG Interpretation   Date/Time:  Thursday June 24 2015 08:16:53 EST Ventricular Rate:  56 PR Interval:  162 QRS Duration: 76 QT Interval:  432 QTC Calculation: 416 R Axis:   4 Text Interpretation:  Sinus bradycardia Low voltage QRS Borderline ECG no  acute ST/T changes no significant change since 2015 Confirmed by GOLDSTON   MD, SCOTT ([id number]) on 06/24/2015 9:35:57 AM      MDM   Final diagnoses:  Gastroesophageal reflux disease, esophagitis presence not specified  Patient with history of GERD presents for epigastric abdominal pain and sternal chest pain. He is reproducible epigastric tenderness on exam. She also has bradycardia on exam and on EKG which she states is normal for her. I believe this is most likely acid reflux. Cardiac workup was done in triage and troponin is negative. I do not believe she needs a repeat troponin since this has been ongoing for over a week. Her chest x-ray was negative for infiltrate, effusion, or pneumothorax. Her labs were also unremarkable. She is perc negative. Reviewed the heart score and she has 2 risk factors including a smoking history and  obesity. Medications  famotidine (PEPCID) IVPB 20 mg premix (0 mg Intravenous Stopped 06/24/15 1207)  ondansetron (ZOFRAN) injection 4 mg (4 mg Intravenous Given 06/24/15 1137)  gi cocktail (Maalox,Lidocaine,Donnatal) (30 mLs Oral Given 06/24/15 1210)  Recheck: She is feeling much better. I do not believe this is cardiac in nature and is most likely GERD. Case management saw the patient and made an appointment on Jan 30th with a pcp. Discussed return precautions with the patient as well as follow-up. She was prescribed Pepcid. She is in agreement with plan.     Ottie Glazier, PA-C 06/24/15 Mar-[place], MD 06/25/15 [phone]

## 2015-06-24 NOTE — ED Notes (Signed)
Pt states epigastric and sternal chest pain x 1 week that is requiring more and more acid reflux medication to alleviate.  Pain is accompanied by sob and nausea.  Hx of GERD.

## 2015-06-24 NOTE — Discharge Instructions (Signed)
Food Choices for Gastroesophageal Reflux Disease, Adult  When you have gastroesophageal reflux disease (GERD), the foods you eat and your eating habits are very important. Choosing the right foods can help ease your discomfort.   WHAT GUIDELINES DO I NEED TO FOLLOW?   · Choose fruits, vegetables, whole grains, and low-fat dairy products.    · Choose low-fat meat, fish, and poultry.  · Limit fats such as oils, salad dressings, butter, nuts, and avocado.    · Keep a food diary. This helps you identify foods that cause symptoms.    · Avoid foods that cause symptoms. These may be different for everyone.    · Eat small meals often instead of 3 large meals a day.    · Eat your meals slowly, in a place where you are relaxed.    · Limit fried foods.    · Cook foods using methods other than frying.    · Avoid drinking alcohol.    · Avoid drinking large amounts of liquids with your meals.    · Avoid bending over or lying down until 2-3 hours after eating.    WHAT FOODS ARE NOT RECOMMENDED?   These are some foods and drinks that may make your symptoms worse:  Vegetables  Tomatoes. Tomato juice. Tomato and spaghetti sauce. Chili peppers. Onion and garlic. Horseradish.  Fruits  Oranges, grapefruit, and lemon (fruit and juice).  Meats  High-fat meats, fish, and poultry. This includes hot dogs, ribs, ham, sausage, salami, and bacon.  Dairy  Whole milk and chocolate milk. Sour cream. Cream. Butter. Ice cream. Cream cheese.   Drinks  Coffee and tea. Bubbly (carbonated) drinks or energy drinks.  Condiments  Hot sauce. Barbecue sauce.   Sweets/Desserts  Chocolate and cocoa. Donuts. Peppermint and spearmint.  Fats and Oils  High-fat foods. This includes French fries and potato chips.  Other  Vinegar. Strong spices. This includes black pepper, white pepper, red pepper, cayenne, curry powder, cloves, ginger, and chili powder.  The items listed above may not be a complete list of foods and drinks to avoid. Contact your dietitian for more  information.     This information is not intended to replace advice given to you by your health care provider. Make sure you discuss any questions you have with your health care provider.     Document Released: 12/05/2011 Document Revised: 06/26/2014 Document Reviewed: 04/09/2013  Elsevier Interactive Patient Education ©2016 [employer]

## 2015-06-24 NOTE — Discharge Planning (Signed)
Spoke to patient regarding primary care resources and the [facility] orange card. Follow up appointment made with the [facility] for Jan 30,2017 @ 10:45am, to establish primary care. Orange card application provided and explained, pt instructed to contact me once application is complete for an eligibility appointment to obtain the orange card. Resource guide and my contact information also provided for any future questions or concerns. No other [facility] Specialist needs identified at this time.   [facility] Specialist  [facility] [phone]

## 2015-07-19 ENCOUNTER — Ambulatory Visit: Payer: Medicaid Other | Admitting: Family Medicine

## 2015-09-20 ENCOUNTER — Encounter: Payer: Self-pay | Admitting: Family Medicine

## 2015-09-20 ENCOUNTER — Ambulatory Visit (INDEPENDENT_AMBULATORY_CARE_PROVIDER_SITE_OTHER): Payer: Self-pay | Admitting: Family Medicine

## 2015-09-20 DIAGNOSIS — K029 Dental caries, unspecified: Secondary | ICD-10-CM

## 2015-09-20 DIAGNOSIS — Z23 Encounter for immunization: Secondary | ICD-10-CM

## 2015-09-20 DIAGNOSIS — Z1239 Encounter for other screening for malignant neoplasm of breast: Secondary | ICD-10-CM

## 2015-09-20 DIAGNOSIS — F17200 Nicotine dependence, unspecified, uncomplicated: Secondary | ICD-10-CM

## 2015-09-20 DIAGNOSIS — L309 Dermatitis, unspecified: Secondary | ICD-10-CM

## 2015-09-20 LAB — COMPLETE METABOLIC PANEL WITH GFR
ALT: 12 U/L (ref 6–29)
AST: 14 U/L (ref 10–30)
Albumin: 3.7 g/dL (ref 3.6–5.1)
Alkaline Phosphatase: 61 U/L (ref 33–115)
BUN: 9 mg/dL (ref 7–25)
CO2: 23 mmol/L (ref 20–31)
Calcium: 8.6 mg/dL (ref 8.6–10.2)
Chloride: 108 mmol/L (ref 98–110)
Creat: 0.8 mg/dL (ref 0.50–1.10)
GFR, Est African American: >89 mL/min (ref >=60)
GFR, Est Non African American: >89 mL/min (ref >=60)
Glucose, Bld: 84 mg/dL (ref 65–99)
Potassium: 4.3 mmol/L (ref 3.5–5.3)
Sodium: 138 mmol/L (ref 135–146)
Total Bilirubin: 0.7 mg/dL (ref 0.2–1.2)
Total Protein: 6.3 g/dL (ref 6.1–8.1)

## 2015-09-20 LAB — CBC WITH DIFFERENTIAL/PLATELET
Basophils Absolute: 0 cells/uL (ref 0–200)
Basophils Relative: 0 %
Eosinophils Absolute: 72 cells/uL (ref 15–500)
Eosinophils Relative: 1 %
HCT: 39 % (ref 35.0–45.0)
Hemoglobin: 13 g/dL (ref 11.7–15.5)
Lymphocytes Relative: 34 %
Lymphs Abs: 2448 cells/uL (ref 850–3900)
MCH: 30 pg (ref 27.0–33.0)
MCHC: 33.3 g/dL (ref 32.0–36.0)
MCV: 89.9 fL (ref 80.0–100.0)
MPV: 9.9 fL (ref 7.5–12.5)
Monocytes Absolute: 576 cells/uL (ref 200–950)
Monocytes Relative: 8 %
Neutro Abs: 4104 cells/uL (ref 1500–7800)
Neutrophils Relative %: 57 %
Platelets: 277 10*3/uL (ref 140–400)
RBC: 4.34 MIL/uL (ref 3.80–5.10)
RDW: 15.9 % — ABNORMAL HIGH (ref 11.0–15.0)
WBC: 7.2 10*3/uL (ref 3.8–10.8)

## 2015-09-20 LAB — POCT URINALYSIS DIP (DEVICE)
Bilirubin Urine: NEGATIVE
Glucose, UA: NEGATIVE mg/dL
Ketones, ur: NEGATIVE mg/dL
Leukocytes, UA: NEGATIVE
Nitrite: NEGATIVE
Protein, ur: NEGATIVE mg/dL
Specific Gravity, Urine: 1.025 (ref 1.005–1.030)
Urobilinogen, UA: 0.2 mg/dL (ref 0.0–1.0)
pH: 5.5 (ref 5.0–8.0)

## 2015-09-20 LAB — LIPID PANEL
Cholesterol: 160 mg/dL (ref 125–200)
HDL: 59 mg/dL (ref >=46)
LDL Cholesterol: 81 mg/dL (ref <130)
Total CHOL/HDL Ratio: 2.7 Ratio (ref <=5.0)
Triglycerides: 100 mg/dL (ref <150)
VLDL: 20 mg/dL (ref <30)

## 2015-09-20 LAB — TSH: TSH: 1.54 mIU/L

## 2015-09-20 MED ORDER — VARENICLINE TARTRATE 0.5 MG X 11 & 1 MG X 42 PO MISC: ORAL | Status: DC

## 2015-09-20 MED ORDER — TRIAMCINOLONE ACETONIDE 0.1 % EX CREA: 1.0000 "application " | TOPICAL_CREAM | Freq: Two times a day (BID) | CUTANEOUS | Status: DC

## 2015-09-20 NOTE — Progress Notes (Signed)
Subjective:    Patient ID: Kathryn Stevenson, female    DOB: [birth year], 40 y.o.   MRN: [id number]  HPI Ms. Kathryn Stevenson, a 40 year old female presents to establish care. She states that she was going to [facility] with Dr. Nolene Ebbs. She states that she has been lost to follow up with Dr. Jeanie Cooks due to insurance constraints. She is an everyday tobacco user and has been smoking a 0.5 packs per day for 20 years. She states that she has attempted to quit in the past without success.   Patient complains of a rash primarily to chin and upper back. Rash has been present for greater than 1 month.  Patient describes the rash as itching. She denies a history of eczema. She has not changed soap, laundry detergent, lotion, and does not have any pets in the home. Patient's previous dermatologic history includes dermatitis. She currently denies any environmental allergies.   Past Medical History  Diagnosis Date  . No pertinent past medical history   . Fracture, ankle   . Acid reflux   No Known Allergies  Past Surgical History  Procedure Laterality Date  . Tubal ligation    . Ankle surgery    . Induced abortion     Social History   Social History  . Marital Status: Single    Spouse Name: N/A  . Number of Children: N/A  . Years of Education: N/A   Occupational History  . Not on file.   Social History Main Topics  . Smoking status: Current Every Day Smoker -- 0.25 packs/day    Types: Cigarettes  . Smokeless tobacco: Not on file  . Alcohol Use: Yes     Comment: occas    . Drug Use: No  . Sexual Activity: Yes    Birth Control/ Protection: Surgical   Other Topics Concern  . Not on file   Social History Narrative   Review of Systems  Constitutional: Positive for unexpected weight change (weight gain).  HENT: Negative.   Eyes: Negative.   Respiratory: Negative.   Cardiovascular: Negative.   Gastrointestinal: Negative.   Endocrine: Negative.  Negative for polydipsia,  polyphagia and polyuria.  Genitourinary: Negative.   Musculoskeletal: Negative.   Skin: Positive for rash.  Allergic/Immunologic: Negative.  Negative for immunocompromised state.  Neurological: Negative.   Hematological: Negative.   Psychiatric/Behavioral: Negative.        Objective:   Physical Exam  Constitutional: She is oriented to person, place, and time. She appears well-developed.  Morbid obesity  HENT:  Head: Normocephalic and atraumatic.  Right Ear: External ear normal.  Left Ear: External ear normal.  Nose: Nose normal.  Mouth/Throat: Oropharynx is clear and moist.  Eyes: Conjunctivae are normal. Pupils are equal, round, and reactive to light.  Neck: Normal range of motion. Neck supple.  Cardiovascular: Normal rate, regular rhythm, normal heart sounds and intact distal pulses.   Pulmonary/Chest: Effort normal and breath sounds normal.  Abdominal: Soft. Bowel sounds are normal.  Increased abdominal girth  Musculoskeletal: Normal range of motion.  Neurological: She is alert and oriented to person, place, and time. She has normal reflexes.  Skin: Skin is warm and dry. Rash noted. Rash is maculopapular (hypperpigmented rash).  Psychiatric: She has a normal mood and affect. Her behavior is normal. Judgment and thought content normal.      BP 121/72 mmHg  Pulse 58  Temp(Src) 98.1 F (36.7 C) (Oral)  Resp 16  Ht 5\' 5"  (  1.651 m)  Wt 285 lb (129.275 kg)  BMI 47.43 kg/m2  SpO2 100%  LMP 08/30/2015 Assessment & Plan:   1. Dermatitis - triamcinolone cream (KENALOG) 0.1 %; Apply 1 application topically 2 (two) times daily.  Dispense: 30 g; Refill: 0  2. Morbid obesity, unspecified obesity type ([place]) - TSH - COMPLETE METABOLIC PANEL WITH GFR - HgB A1c - CBC with Differential - POCT urinalysis dipstick - Lipid Panel  3. Dental caries - Ambulatory referral to Dentistry  4. Breast cancer screening - MM Digital Screening; Future  5. Tobacco dependence -  varenicline (CHANTIX STARTING MONTH PAK) 0.5 MG X 11 & 1 MG X 42 tablet; Take one 0.5 mg tablet by mouth once daily for 3 days, then increase to one 0.5 mg tablet twice daily for 4 days, then increase to one 1 mg tablet twice daily.  Dispense: 53 tablet; Refill: 0  6. Immunization due - Pneumococcal polysaccharide vaccine 23-valent greater than or equal to 2yo subcutaneous/IM  7. Need for Tdap vaccination - Tdap vaccine greater than or equal to 7yo IM   Routine Health Maintenance:  Last pap smear 1 year ago (normal per patient) Recommend smoking cessation. Patient and I discussed starting Chantix at length.    RTC: Will scheduled a follow up appointment after reviewing labs   [facility] M, FNP     The patient was given clear instructions to go to ER or return to medical center if symptoms do not improve, worsen or new problems develop. The patient verbalized understanding. Will notify patient with laboratory results.

## 2015-09-20 NOTE — Patient Instructions (Addendum)
[employer].  Trial of Triamcinolone 1%, twice daily to affected area.   Diet for Metabolic Syndrome Metabolic syndrome is a disorder that includes at least three of these conditions:  Abdominal obesity.  Too much sugar in your blood.  High blood pressure.  Higher than normal amount of fat (lipids) in your blood.  Lower than normal level of "good" cholesterol (HDL). Following a healthy diet can help to keep metabolic syndrome under control. It can also help to prevent the development of conditions that are associated with metabolic syndrome, such as diabetes, heart disease, and stroke. Along with exercise, a healthy diet:  Helps to improve the way that the body uses insulin.  Promotes weight loss. A common goal for people with this condition is to lose at least 7 to 10 percent of their starting weight. WHAT DO I NEED TO KNOW ABOUT THIS DIET?  Use the glycemic index (GI) to plan your meals. The index tells you how quickly a food will raise your blood sugar. Choose foods that have low GI values. These foods take a longer time to raise blood sugar.  Keep track of how many calories you take in. Eating the right amount of calories will help your achieve a healthy weight.  You may want to follow a Mediterranean diet. This diet includes lots of vegetables, lean meats or fish, whole grains, fruits, and healthy oils and fats. WHAT FOODS CAN I EAT? Grains Stone-ground whole wheat. Pumpernickel bread. Whole-grain bread, crackers, tortillas, cereal, and pasta. Unsweetened oatmeal.Bulgur.Barley.Quinoa.Brown rice or wild rice. Vegetables Lettuce. Spinach. Peas. Beets. Cauliflower. Cabbage. Broccoli. Carrots. Tomatoes. Squash. Eggplant. Herbs. Peppers. Onions. Cucumbers. Brussels sprouts. Sweet potatoes. Yams. Beans. Lentils. Fruits Berries. Apples. Oranges. Grapes. Mango. Pomegranate. Kiwi. Cherries. Meats and Other Protein Sources Seafood and shellfish. Lean meats.Poultry.  Tofu. Dairy Low-fat or fat-free dairy products, such as milk, yogurt, and cheese. Beverages Water. Low-fat milk. Milk alternatives, like soy milk or almond milk. Real fruit juice. Condiments Low-sugar or sugar-free ketchup, barbecue sauce, and mayonnaise. Mustard. Relish. Fats and Oils Avocado. Canola or olive oil. Nuts and nut butters.Seeds. The items listed above may not be a complete list of recommended foods or beverages. Contact your dietitian for more options.  WHAT FOODS ARE NOT RECOMMENDED? Red meat. Palm oil and coconut oil. Processed foods. Fried foods. Alcohol. Sweetened drinks, such as iced tea and soda. Sweets. Salty foods. The items listed above may not be a complete list of foods and beverages to avoid. Contact your dietitian for more information.   This information is not intended to replace advice given to you by your health care provider. Make sure you discuss any questions you have with your health care provider.   Document Released: 10/20/2014 Document Reviewed: 10/20/2014 Elsevier Interactive Patient Education [employer].

## 2015-09-24 ENCOUNTER — Other Ambulatory Visit: Payer: Self-pay

## 2015-09-24 DIAGNOSIS — F17200 Nicotine dependence, unspecified, uncomplicated: Secondary | ICD-10-CM

## 2015-09-24 MED ORDER — VARENICLINE TARTRATE 0.5 MG X 11 & 1 MG X 42 PO MISC: ORAL | Status: DC

## 2015-09-24 NOTE — Telephone Encounter (Signed)
Refill for chantix sent into pharmacy, since they did not receive. Manually faxed. Thanks!

## 2015-10-01 ENCOUNTER — Other Ambulatory Visit: Payer: Self-pay | Admitting: Family Medicine

## 2015-10-01 DIAGNOSIS — Z1231 Encounter for screening mammogram for malignant neoplasm of breast: Secondary | ICD-10-CM

## 2015-10-19 ENCOUNTER — Ambulatory Visit
Admission: RE | Admit: 2015-10-19 | Discharge: 2015-10-19 | Disposition: A | Discharge status: Home / Self Care | Payer: No Typology Code available for payment source | Source: Ambulatory Visit | Attending: Family Medicine | Admitting: Family Medicine

## 2015-10-19 DIAGNOSIS — Z1231 Encounter for screening mammogram for malignant neoplasm of breast: Secondary | ICD-10-CM

## 2015-11-27 ENCOUNTER — Encounter (HOSPITAL_COMMUNITY): Payer: Self-pay | Admitting: *Deleted

## 2015-11-27 ENCOUNTER — Inpatient Hospital Stay (HOSPITAL_COMMUNITY)
Admission: AD | Admit: 2015-11-27 | Discharge: 2015-11-27 | Disposition: A | Discharge status: Home / Self Care | Payer: Medicaid Other | Source: Ambulatory Visit | Attending: Obstetrics & Gynecology | Admitting: Obstetrics & Gynecology

## 2015-11-27 DIAGNOSIS — A599 Trichomoniasis, unspecified: Secondary | ICD-10-CM | POA: Diagnosis not present

## 2015-11-27 DIAGNOSIS — R109 Unspecified abdominal pain: Secondary | ICD-10-CM | POA: Diagnosis present

## 2015-11-27 DIAGNOSIS — F17210 Nicotine dependence, cigarettes, uncomplicated: Secondary | ICD-10-CM | POA: Diagnosis not present

## 2015-11-27 DIAGNOSIS — K219 Gastro-esophageal reflux disease without esophagitis: Secondary | ICD-10-CM | POA: Diagnosis not present

## 2015-11-27 DIAGNOSIS — R300 Dysuria: Secondary | ICD-10-CM | POA: Insufficient documentation

## 2015-11-27 LAB — HIV ANTIBODY (ROUTINE TESTING W REFLEX): HIV Screen 4th Generation wRfx: NONREACTIVE

## 2015-11-27 LAB — URINALYSIS, ROUTINE W REFLEX MICROSCOPIC
Bilirubin Urine: NEGATIVE
Glucose, UA: NEGATIVE mg/dL
Ketones, ur: NEGATIVE mg/dL
Nitrite: NEGATIVE
Protein, ur: NEGATIVE mg/dL
Specific Gravity, Urine: 1.025 (ref 1.005–1.030)
pH: 6 (ref 5.0–8.0)

## 2015-11-27 LAB — WET PREP, GENITAL
Sperm: NONE SEEN
Yeast Wet Prep HPF POC: NONE SEEN

## 2015-11-27 LAB — POCT PREGNANCY, URINE: Preg Test, Ur: NEGATIVE

## 2015-11-27 LAB — SYPHILIS: RPR W/REFLEX TO RPR TITER AND TREPONEMAL ANTIBODIES, TRADITIONAL SCREENING AND DIAGNOSIS ALGORITHM: RPR Ser Ql: NONREACTIVE

## 2015-11-27 LAB — URINE MICROSCOPIC-ADD ON

## 2015-11-27 MED ORDER — METRONIDAZOLE 500 MG PO TABS: 2000.0000 mg | ORAL_TABLET | Freq: Once | ORAL | Status: DC

## 2015-11-27 MED ORDER — METRONIDAZOLE 500 MG PO TABS: 500.0000 mg | ORAL_TABLET | Freq: Two times a day (BID) | ORAL | Status: DC

## 2015-11-27 MED ORDER — OMEPRAZOLE 20 MG PO CPDR: 20.0000 mg | DELAYED_RELEASE_CAPSULE | Freq: Every day | ORAL | Status: DC

## 2015-11-27 MED ORDER — GI COCKTAIL ~~LOC~~
30.0000 mL | Freq: Once | ORAL | Status: AC
  Administered 2015-11-27: 30 mL via ORAL
  Filled 2015-11-27: qty 30

## 2015-11-27 NOTE — MAU Provider Note (Signed)
History     CSN: [id number]  Arrival date and time: 11/27/15 0607   First Provider Initiated Contact with Patient 11/27/15 [phone]      Chief Complaint  Patient presents with  . Abdominal Pain  . Gastroesophageal Reflux  . Dysuria   HPI Ms. Kathryn Stevenson is a 40 y.o. [id number] who presents to MAU today with complaint of dysuria, acid reflux and lower abdominal pain. The patient states dysuria since Wednesday. She notes some associated frequency, but denies urgency, hematuria today. She has had some lower abdominal cramping since onset of dysuria as well. She also endorses a new white discharge without odor. She is sexually active with a new partner and desires STD testing today. She has not taken anything for pain. She also complains of return of GERD symptoms. She has a history of GERD and was taking Prilosec. She had not had symptoms for the last ~ 5 months due to dietary changes. She recently had some fried fish and has had return of acid reflux. She tried OTC Zantac with some relief.   OB History    Gravida Para Term Preterm AB TAB SAB Ectopic Multiple Living   6 2 2  4 3 1   2       Past Medical History  Diagnosis Date  . No pertinent past medical history   . Fracture, ankle   . Acid reflux     Past Surgical History  Procedure Laterality Date  . Tubal ligation    . Ankle surgery    . Induced abortion      Family History  Problem Relation Age of Onset  . Other Neg Hx   . Hypertension Mother   . Cancer Maternal Grandmother     Social History  Substance Use Topics  . Smoking status: Current Every Day Smoker -- 0.50 packs/day    Types: Cigarettes  . Smokeless tobacco: None  . Alcohol Use: Yes     Comment: occas      Allergies: No Known Allergies  Prescriptions prior to admission  Medication Sig Dispense Refill Last Dose  . acetaminophen (TYLENOL) 650 MG CR tablet Take 650 mg by mouth every 8 (eight) hours as needed for pain. Reported on 09/20/2015   11/26/2015 at  Unknown time  . ranitidine (ZANTAC) 150 MG tablet Take 150 mg by mouth 2 (two) times daily.   Past Week at Unknown time  . triamcinolone cream (KENALOG) 0.1 % Apply 1 application topically 2 (two) times daily. 30 g 0 11/26/2015 at Unknown time  . varenicline (CHANTIX STARTING MONTH PAK) 0.5 MG X 11 & 1 MG X 42 tablet Take one 0.5 mg tablet by mouth once daily for 3 days, then increase to one 0.5 mg tablet twice daily for 4 days, then increase to one 1 mg tablet twice daily. 53 tablet 0 11/26/2015 at Unknown time  . famotidine (PEPCID) 20 MG tablet Take 1 tablet (20 mg total) by mouth daily. (Patient not taking: Reported on 09/20/2015) 30 tablet 0 Not Taking  . [DISCONTINUED] omeprazole (PRILOSEC) 20 MG capsule Take 20 mg by mouth daily as needed. For acid reflux   Taking    Review of Systems  Constitutional: Negative for fever and malaise/fatigue.  Gastrointestinal: Positive for heartburn and abdominal pain. Negative for nausea, vomiting, diarrhea and constipation.  Genitourinary: Positive for dysuria and frequency. Negative for urgency, hematuria and flank pain.       Neg - vaginal bleeding + discharge   Physical Exam  Blood pressure 134/87, pulse 61, resp. rate 18, height 5\' 6"  (1.676 m), weight 279 lb 9.6 oz (126.826 kg), last menstrual period 11/15/2015.  Physical Exam  Nursing note and vitals reviewed. Constitutional: She is oriented to person, place, and time. She appears well-developed and well-nourished. No distress.  HENT:  Head: Normocephalic and atraumatic.  Cardiovascular: Normal rate.   Respiratory: Effort normal.  GI: Soft. Bowel sounds are normal. She exhibits no distension and no mass. There is tenderness (very mild lower abdominal tenderness to palpation bilaterally). There is no rebound and no guarding.  Genitourinary: Uterus is tender (mild). Uterus is not enlarged. Cervix exhibits no motion tenderness, no discharge and no friability. Right adnexum displays no mass and no  tenderness. Left adnexum displays no mass and no tenderness. No bleeding in the vagina. Vaginal discharge (scant white discharge noted) found.  Neurological: She is alert and oriented to person, place, and time.  Skin: Skin is warm and dry. No erythema.  Psychiatric: She has a normal mood and affect.   Results for orders placed or performed during the hospital encounter of 11/27/15 (from the past 24 hour(s))  Urinalysis, Routine w reflex microscopic (not at [facility])     Status: Abnormal   Collection Time: 11/27/15  6:25 AM  Result Value Ref Range   Color, Urine YELLOW YELLOW   APPearance CLEAR CLEAR   Specific Gravity, Urine 1.025 1.005 - 1.030   pH 6.0 5.0 - 8.0   Glucose, UA NEGATIVE NEGATIVE mg/dL   Hgb urine dipstick SMALL (A) NEGATIVE   Bilirubin Urine NEGATIVE NEGATIVE   Ketones, ur NEGATIVE NEGATIVE mg/dL   Protein, ur NEGATIVE NEGATIVE mg/dL   Nitrite NEGATIVE NEGATIVE   Leukocytes, UA SMALL (A) NEGATIVE  Urine microscopic-add on     Status: Abnormal   Collection Time: 11/27/15  6:25 AM  Result Value Ref Range   Squamous Epithelial / LPF 0-5 (A) NONE SEEN   WBC, UA 6-30 0 - 5 WBC/hpf   RBC / HPF 0-5 0 - 5 RBC/hpf   Bacteria, UA FEW (A) NONE SEEN  Pregnancy, urine POC     Status: None   Collection Time: 11/27/15  6:31 AM  Result Value Ref Range   Preg Test, Ur NEGATIVE NEGATIVE  Wet prep, genital     Status: Abnormal   Collection Time: 11/27/15  6:42 AM  Result Value Ref Range   Yeast Wet Prep HPF POC NONE SEEN NONE SEEN   Trich, Wet Prep PRESENT (A) NONE SEEN   Clue Cells Wet Prep HPF POC PRESENT (A) NONE SEEN   WBC, Wet Prep HPF POC FEW (A) NONE SEEN   Sperm NONE SEEN     MAU Course  Procedures None  MDM UPT - negative UA, wet prep, GC/Chlamydia, HIV and RPR today  GI cocktail given for acid reflux Urine culture ordered. Will await results for treatment if needed.  Assessment and Plan  A: Acid reflux  P: Discharge home Rx for Prilosec and Flagyl given to  patient  Partner treatment advised Diet for GERD included on AVS Warning signs for worsening condition discussed Patient advised to follow-up with PCP as scheduled or sooner if symptoms persist or worsen Patient may return to MAU as needed or if her condition were to change or worsen   Luvenia Redden, PA-C  11/27/2015, 7:27 AM

## 2015-11-27 NOTE — Discharge Instructions (Signed)
Food Choices for Gastroesophageal Reflux Disease, Adult When you have gastroesophageal reflux disease (GERD), the foods you eat and your eating habits are very important. Choosing the right foods can help ease your discomfort.  WHAT GUIDELINES DO I NEED TO FOLLOW?   Choose fruits, vegetables, whole grains, and low-fat dairy products.   Choose low-fat meat, fish, and poultry.  Limit fats such as oils, salad dressings, butter, nuts, and avocado.   Keep a food diary. This helps you identify foods that cause symptoms.   Avoid foods that cause symptoms. These may be different for everyone.   Eat small meals often instead of 3 large meals a day.   Eat your meals slowly, in a place where you are relaxed.   Limit fried foods.   Cook foods using methods other than frying.   Avoid drinking alcohol.   Avoid drinking large amounts of liquids with your meals.   Avoid bending over or lying down until 2-3 hours after eating.  WHAT FOODS ARE NOT RECOMMENDED?  These are some foods and drinks that may make your symptoms worse: Vegetables Tomatoes. Tomato juice. Tomato and spaghetti sauce. Chili peppers. Onion and garlic. Horseradish. Fruits Oranges, grapefruit, and lemon (fruit and juice). Meats High-fat meats, fish, and poultry. This includes hot dogs, ribs, ham, sausage, salami, and bacon. Dairy Whole milk and chocolate milk. Sour cream. Cream. Butter. Ice cream. Cream cheese.  Drinks Coffee and tea. Bubbly (carbonated) drinks or energy drinks. Condiments Hot sauce. Barbecue sauce.  Sweets/Desserts Chocolate and cocoa. Donuts. Peppermint and spearmint. Fats and Oils High-fat foods. This includes [place] fries and potato chips. Other Vinegar. Strong spices. This includes black pepper, white pepper, red pepper, cayenne, curry powder, cloves, ginger, and chili powder. The items listed above may not be a complete list of foods and drinks to avoid. Contact your dietitian for more  information.   This information is not intended to replace advice given to you by your health care provider. Make sure you discuss any questions you have with your health care provider.   Document Released: 12/05/2011 Document Revised: 06/26/2014 Document Reviewed: 04/09/2013 Elsevier Interactive Patient Education 2016 [employer]. Trichomoniasis Trichomoniasis is an infection caused by an organism called Trichomonas. The infection can affect both women and men. In women, the outer female genitalia and the vagina are affected. In men, the penis is mainly affected, but the prostate and other reproductive organs can also be involved. Trichomoniasis is a sexually transmitted infection (STI) and is most often passed to another person through sexual contact.  RISK FACTORS  Having unprotected sexual intercourse.  Having sexual intercourse with an infected partner. SIGNS AND SYMPTOMS  Symptoms of trichomoniasis in women include:  Abnormal gray-green frothy vaginal discharge.  Itching and irritation of the vagina.  Itching and irritation of the area outside the vagina. Symptoms of trichomoniasis in men include:   Penile discharge with or without pain.  Pain during urination. This results from inflammation of the urethra. DIAGNOSIS  Trichomoniasis may be found during a Pap test or physical exam. Your health care provider may use one of the following methods to help diagnose this infection:  Testing the pH of the vagina with a test tape.  Using a vaginal swab test that checks for the Trichomonas organism. A test is available that provides results within a few minutes.  Examining a urine sample.  Testing vaginal secretions. Your health care provider may test you for other STIs, including HIV. TREATMENT   You may be given  medicine to fight the infection. Women should inform their health care provider if they could be or are pregnant. Some medicines used to treat the infection should not  be taken during pregnancy.  Your health care provider may recommend over-the-counter medicines or creams to decrease itching or irritation.  Your sexual partner will need to be treated if infected.  Your health care provider may test you for infection again 3 months after treatment. HOME CARE INSTRUCTIONS   Take medicines only as directed by your health care provider.  Take over-the-counter medicine for itching or irritation as directed by your health care provider.  Do not have sexual intercourse while you have the infection.  Women should not douche or wear tampons while they have the infection.  Discuss your infection with your partner. Your partner may have gotten the infection from you, or you may have gotten it from your partner.  Have your sex partner get examined and treated if necessary.  Practice safe, informed, and protected sex.  See your health care provider for other STI testing. SEEK MEDICAL CARE IF:   You still have symptoms after you finish your medicine.  You develop abdominal pain.  You have pain when you urinate.  You have bleeding after sexual intercourse.  You develop a rash.  Your medicine makes you sick or makes you throw up (vomit). MAKE SURE YOU:  Understand these instructions.  Will watch your condition.  Will get help right away if you are not doing well or get worse.   This information is not intended to replace advice given to you by your health care provider. Make sure you discuss any questions you have with your health care provider.   Document Released: 11/29/2000 Document Revised: 06/26/2014 Document Reviewed: 03/17/2013 Elsevier Interactive Patient Education [employer].

## 2015-11-27 NOTE — MAU Note (Signed)
Painful urination since Weds, along with GERD and lower abd pains. Some white vag d/c without odor

## 2015-11-28 LAB — URINE CULTURE: Culture: NO GROWTH

## 2015-11-29 LAB — GC/CHLAMYDIA PROBE AMP (~~LOC~~) NOT AT ARMC
Chlamydia: NEGATIVE
Neisseria Gonorrhea: NEGATIVE

## 2016-01-23 ENCOUNTER — Other Ambulatory Visit: Payer: Self-pay | Admitting: Medical

## 2016-03-17 ENCOUNTER — Ambulatory Visit: Payer: Medicaid Other | Admitting: Family Medicine

## 2016-06-20 ENCOUNTER — Emergency Department (HOSPITAL_COMMUNITY)
Admission: EM | Admit: 2016-06-20 | Discharge: 2016-06-20 | Disposition: A | Discharge status: Home / Self Care | Payer: Medicaid Other | Attending: Emergency Medicine | Admitting: Emergency Medicine

## 2016-06-20 ENCOUNTER — Encounter (HOSPITAL_COMMUNITY): Payer: Self-pay | Admitting: Emergency Medicine

## 2016-06-20 ENCOUNTER — Emergency Department (HOSPITAL_COMMUNITY): Payer: Medicaid Other

## 2016-06-20 DIAGNOSIS — K828 Other specified diseases of gallbladder: Secondary | ICD-10-CM | POA: Diagnosis not present

## 2016-06-20 DIAGNOSIS — K219 Gastro-esophageal reflux disease without esophagitis: Secondary | ICD-10-CM | POA: Diagnosis not present

## 2016-06-20 DIAGNOSIS — R319 Hematuria, unspecified: Secondary | ICD-10-CM

## 2016-06-20 DIAGNOSIS — K8050 Calculus of bile duct without cholangitis or cholecystitis without obstruction: Secondary | ICD-10-CM | POA: Insufficient documentation

## 2016-06-20 DIAGNOSIS — F17210 Nicotine dependence, cigarettes, uncomplicated: Secondary | ICD-10-CM | POA: Diagnosis not present

## 2016-06-20 DIAGNOSIS — R1011 Right upper quadrant pain: Secondary | ICD-10-CM

## 2016-06-20 DIAGNOSIS — R109 Unspecified abdominal pain: Secondary | ICD-10-CM | POA: Diagnosis present

## 2016-06-20 DIAGNOSIS — R110 Nausea: Secondary | ICD-10-CM

## 2016-06-20 LAB — CBC WITH DIFFERENTIAL/PLATELET
Basophils Absolute: 0 10*3/uL (ref 0.0–0.1)
Basophils Relative: 0 %
Eosinophils Absolute: 0.1 10*3/uL (ref 0.0–0.7)
Eosinophils Relative: 1 %
HCT: 39.3 % (ref 36.0–46.0)
Hemoglobin: 14.1 g/dL (ref 12.0–15.0)
Lymphocytes Relative: 43 %
Lymphs Abs: 2.3 10*3/uL (ref 0.7–4.0)
MCH: 30.7 pg (ref 26.0–34.0)
MCHC: 35.9 g/dL (ref 30.0–36.0)
MCV: 85.6 fL (ref 78.0–100.0)
Monocytes Absolute: 0.5 10*3/uL (ref 0.1–1.0)
Monocytes Relative: 9 %
Neutro Abs: 2.5 10*3/uL (ref 1.7–7.7)
Neutrophils Relative %: 47 %
Platelets: 227 10*3/uL (ref 150–400)
RBC: 4.59 MIL/uL (ref 3.87–5.11)
RDW: 14.4 % (ref 11.5–15.5)
WBC: 5.4 10*3/uL (ref 4.0–10.5)

## 2016-06-20 LAB — COMPREHENSIVE METABOLIC PANEL
ALT: 23 U/L (ref 14–54)
AST: 35 U/L (ref 15–41)
Albumin: 3.8 g/dL (ref 3.5–5.0)
Alkaline Phosphatase: 75 U/L (ref 38–126)
Anion gap: 6 (ref 5–15)
BUN: 12 mg/dL (ref 6–20)
CO2: 24 mmol/L (ref 22–32)
Calcium: 8.4 mg/dL — ABNORMAL LOW (ref 8.9–10.3)
Chloride: 106 mmol/L (ref 101–111)
Creatinine, Ser: 0.82 mg/dL (ref 0.44–1.00)
GFR calc Af Amer: >60 mL/min (ref >60)
GFR calc non Af Amer: >60 mL/min (ref >60)
Glucose, Bld: 98 mg/dL (ref 65–99)
Potassium: 4.6 mmol/L (ref 3.5–5.1)
Sodium: 136 mmol/L (ref 135–145)
Total Bilirubin: 0.5 mg/dL (ref 0.3–1.2)
Total Protein: 7.6 g/dL (ref 6.5–8.1)

## 2016-06-20 LAB — LIPASE, BLOOD: Lipase: 21 U/L (ref 11–51)

## 2016-06-20 LAB — URINALYSIS, ROUTINE W REFLEX MICROSCOPIC
Bilirubin Urine: NEGATIVE
Glucose, UA: NEGATIVE mg/dL
Hgb urine dipstick: NEGATIVE
Ketones, ur: NEGATIVE mg/dL
Leukocytes, UA: NEGATIVE
Nitrite: NEGATIVE
Protein, ur: NEGATIVE mg/dL
Specific Gravity, Urine: 1.021 (ref 1.005–1.030)
pH: 5 (ref 5.0–8.0)

## 2016-06-20 LAB — POC URINE PREG, ED: Preg Test, Ur: NEGATIVE

## 2016-06-20 MED ORDER — ONDANSETRON 4 MG PO TBDP: 4.0000 mg | ORAL_TABLET | Freq: Three times a day (TID) | ORAL | 0 refills | Status: DC | PRN

## 2016-06-20 MED ORDER — RANITIDINE HCL 150 MG PO TABS: 150.0000 mg | ORAL_TABLET | Freq: Two times a day (BID) | ORAL | 0 refills | Status: DC

## 2016-06-20 MED ORDER — SODIUM CHLORIDE 0.9 % IV BOLUS (SEPSIS)
1000.0000 mL | Freq: Once | INTRAVENOUS | Status: AC
  Administered 2016-06-20: 1000 mL via INTRAVENOUS

## 2016-06-20 MED ORDER — ONDANSETRON HCL 4 MG/2ML IJ SOLN
4.0000 mg | Freq: Once | INTRAMUSCULAR | Status: AC
  Administered 2016-06-20: 4 mg via INTRAVENOUS
  Filled 2016-06-20: qty 2

## 2016-06-20 MED ORDER — GI COCKTAIL ~~LOC~~
30.0000 mL | Freq: Once | ORAL | Status: AC
Start: 2016-06-20 — End: 2016-06-20
  Administered 2016-06-20: 30 mL via ORAL
  Filled 2016-06-20: qty 30

## 2016-06-20 MED ORDER — FAMOTIDINE IN NACL 20-0.9 MG/50ML-% IV SOLN
20.0000 mg | Freq: Once | INTRAVENOUS | Status: AC
  Administered 2016-06-20: 20 mg via INTRAVENOUS
  Filled 2016-06-20: qty 50

## 2016-06-20 NOTE — ED Provider Notes (Signed)
[facility] DEPT Provider Note   CSN: [id number] Arrival date & time: 06/20/16  0910     History   Chief Complaint Chief Complaint  Patient presents with  . Flank Pain    HPI NATALLIA KAPNER is a 41 y.o. female with a PMHx of GERD and morbid obesity, with a PSHx of tubal ligation, who presents to the ED with complaints of right flank pain that began 6 days ago. Patient states that last week she was sick with a cold, consumed "lots of Mucinex and orange juice" and thinks that she didn't drink enough water because she started having some right flank pain. She describes this pain as 10/10 constant throbbing right flank pain radiating to the right upper quadrant, worse with movement, and improved with water and cranberry juice consumption. This morning and over the last several days she's noticed some hematuria and increased urinary frequency. She also has had 2 episodes daily of looser than normal nonbloody stool, although she denies any watery diarrhea. She is also had nausea. Additionally she reports that she's been having some indigestion due to the holidays, which she states is chronic and unchanged, but she is requesting the GI cocktail. Admits to consuming alcohol on New Year's Eve, drinks socially. LMP 2 weeks ago. Denies history of kidney stones.  She denies fevers, chills, CP, SOB, vomiting, constipation, melena, hematochezia, obstipation, dysuria, malodorous urine, vaginal bleeding/discharge, myalgias, arthralgias, numbness, tingling, weakness, or any other complaints. States the URI/cold is getting better. Denies recent travel, sick contacts (aside from URI), suspicious food intake, or chronic NSAID use. Takes prilosec daily.    The history is provided by the patient and medical records. No language interpreter was used.  Flank Pain  This is a new problem. The current episode started more than 2 days ago. The problem occurs constantly. The problem has not changed since  onset.Associated symptoms include abdominal pain. Pertinent negatives include no chest pain and no shortness of breath. Exacerbated by: movement. The symptoms are relieved by drinking (water and cranberry juice). She has tried water for the symptoms. The treatment provided moderate relief.    Past Medical History:  Diagnosis Date  . Acid reflux   . Fracture, ankle   . No pertinent past medical history     Patient Active Problem List   Diagnosis Date Noted  . Morbid obesity ([place]) 09/20/2015  . Severe obesity (BMI >= 40) ([place]) 09/20/2015  . Dermatitis 09/20/2015    Past Surgical History:  Procedure Laterality Date  . ANKLE SURGERY    . INDUCED ABORTION    . TUBAL LIGATION      OB History    Gravida Para Term Preterm AB Living   6 2 2   4 2    SAB TAB Ectopic Multiple Live Births   1 3             Home Medications    Prior to Admission medications   Medication Sig Start Date End Date Taking? Authorizing Provider  acetaminophen (TYLENOL) 650 MG CR tablet Take 650 mg by mouth every 8 (eight) hours as needed for pain. Reported on 09/20/2015    Historical Provider, MD  metroNIDAZOLE (FLAGYL) 500 MG tablet Take 1 tablet (500 mg total) by mouth 2 (two) times daily. 11/27/15   Luvenia Redden, PA-C  omeprazole (PRILOSEC) 20 MG capsule TAKE 1 CAPSULE EVERY DAY 01/24/16   Luvenia Redden, PA-C  ranitidine (ZANTAC) 150 MG tablet Take 150 mg by mouth 2 (two)  times daily.    Historical Provider, MD  triamcinolone cream (KENALOG) 0.1 % Apply 1 application topically 2 (two) times daily. 09/20/15   Dorena Dew, FNP  varenicline (CHANTIX STARTING MONTH PAK) 0.5 MG X 11 & 1 MG X 42 tablet Take one 0.5 mg tablet by mouth once daily for 3 days, then increase to one 0.5 mg tablet twice daily for 4 days, then increase to one 1 mg tablet twice daily. 09/24/15   Dorena Dew, FNP    Family History Family History  Problem Relation Age of Onset  . Hypertension Mother   . Cancer Maternal Grandmother    . Other Neg Hx     Social History Social History  Substance Use Topics  . Smoking status: Current Every Day Smoker    Packs/day: 0.50    Types: Cigarettes  . Smokeless tobacco: Not on file  . Alcohol use Yes     Comment: occas       Allergies   Patient has no known allergies.   Review of Systems Review of Systems  Constitutional: Negative for chills and fever.  Respiratory: Negative for shortness of breath.   Cardiovascular: Negative for chest pain.  Gastrointestinal: Positive for abdominal pain, diarrhea (2x/day looser than normal but not watery) and nausea. Negative for blood in stool, constipation and vomiting.  Genitourinary: Positive for flank pain, frequency and hematuria. Negative for dysuria, vaginal bleeding and vaginal discharge.  Musculoskeletal: Negative for arthralgias and myalgias.  Skin: Negative for color change.  Allergic/Immunologic: Negative for immunocompromised state.  Neurological: Negative for weakness and numbness.  Psychiatric/Behavioral: Negative for confusion.   10 Systems reviewed and are negative for acute change except as noted in the HPI.   Physical Exam Updated Vital Signs BP 111/79 (BP Location: Left Arm)   Pulse 64   Temp 98.2 F (36.8 C) (Oral)   Resp 18   LMP 06/09/2016   SpO2 100%   Physical Exam  Constitutional: She is oriented to person, place, and time. Vital signs are normal. She appears well-developed and well-nourished.  Non-toxic appearance. No distress.  Afebrile, nontoxic, NAD  HENT:  Head: Normocephalic and atraumatic.  Mouth/Throat: Oropharynx is clear and moist and mucous membranes are normal.  Eyes: Conjunctivae and EOM are normal. Right eye exhibits no discharge. Left eye exhibits no discharge.  Neck: Normal range of motion. Neck supple.  Cardiovascular: Normal rate, regular rhythm, normal heart sounds and intact distal pulses.  Exam reveals no gallop and no friction rub.   No murmur heard. Pulmonary/Chest:  Effort normal and breath sounds normal. No respiratory distress. She has no decreased breath sounds. She has no wheezes. She has no rhonchi. She has no rales.  Abdominal: Soft. Normal appearance and bowel sounds are normal. She exhibits no distension. There is tenderness in the right upper quadrant. There is CVA tenderness and positive Murphy's sign. There is no rigidity, no rebound, no guarding and no tenderness at McBurney's point.    Soft, obese which limits exam slightly, but overall nondistended, +BS throughout, with mild RUQ TTP, no r/g/r, +murphy's, neg mcburney's, with mild R CVA TTP   Musculoskeletal: Normal range of motion.  Neurological: She is alert and oriented to person, place, and time. She has normal strength. No sensory deficit.  Skin: Skin is warm, dry and intact. No rash noted.  Psychiatric: She has a normal mood and affect.  Nursing note and vitals reviewed.    ED Treatments / Results  Labs (all labs ordered  are listed, but only abnormal results are displayed) Labs Reviewed  COMPREHENSIVE METABOLIC PANEL - Abnormal; Notable for the following:       Result Value   Calcium 8.4 (*)    All other components within normal limits  URINALYSIS, ROUTINE W REFLEX MICROSCOPIC  CBC WITH DIFFERENTIAL/PLATELET  LIPASE, BLOOD  POC URINE PREG, ED    EKG  EKG Interpretation None       Radiology US Abdomen Complete  Result Date: 06/20/2016 CLINICAL DATA:  Right flank pain radiating to the right upper quadrant for the past week associated with nausea. EXAM: ABDOMEN ULTRASOUND COMPLETE COMPARISON:  Abdominal ultrasound of February 27, 2013 FINDINGS: Gallbladder: The gallbladder is adequately distended. The gallbladder wall is top normal in thickness at 3.1 mm. There is no pericholecystic fluid or positive sonographic Murphy's sign. No stones or sludge are observed. Common bile duct: Diameter: 2.8 mm Liver: The hepatic echotexture is increased. In the right hepatic lobe there is a  hyperechoic focus measuring 1.8 x 1.9 x 1.8 cm most compatible with a hemangioma. There is no intrahepatic ductal dilation. IVC: No abnormality visualized. Pancreas: Visualized portion unremarkable. Spleen: Size and appearance within normal limits. Right Kidney: Length: 10.6 cm. Echogenicity within normal limits. No mass or hydronephrosis visualized. Left Kidney: Length: 10.9 cm. Echogenicity within normal limits. No mass or hydronephrosis visualized. Abdominal aorta: No aneurysm visualized. Other findings: There is no ascites. IMPRESSION: Borderline gallbladder wall thickening. No evidence of stones or sludge nor positive sonographic Murphy's sign. If gallbladder dysfunction is suspected clinically, a nuclear medicine hepatobiliary scan with gallbladder ejection fraction determination may be useful. Probable hemangioma posteriorly in the right hepatic lobe measuring 1.9 cm in greatest dimension. FOLLOW-UP ULTRASOUND OF THE LIVER IN 6 MONTHS IS RECOMMENDED. No acute abnormality is observed elsewhere within the abdomen. Electronically Signed   By: David  Martinique M.D.   On: 06/20/2016 11:04    Procedures Procedures (including critical care time)  Medications Ordered in ED Medications  gi cocktail (Maalox,Lidocaine,Donnatal) (30 mLs Oral Given 06/20/16 1020)  famotidine (PEPCID) IVPB 20 mg premix (0 mg Intravenous Stopped 06/20/16 1130)  sodium chloride 0.9 % bolus 1,000 mL (1,000 mLs Intravenous New Bag/Given 06/20/16 1018)  ondansetron (ZOFRAN) injection 4 mg (4 mg Intravenous Given 06/20/16 1017)     Initial Impression / Assessment and Plan / ED Course  I have reviewed the triage vital signs and the nursing notes.  Pertinent labs & imaging results that were available during my care of the patient were reviewed by me and considered in my medical decision making (see chart for details).  Clinical Course     41 y.o. female here with R flank pain radiating to RUQ x6 days, some looser than normal stools,  nausea, and chronic indigestion (requesting GI cocktail); also with increased urinary freq and hematuria over last several days. States she has GERD, drank EtOH on new year's eve and having indigestion since then. No hx of kidney stones, still has gallbladder. On exam, obese which limits exam slightly but no obvious distension, mild TTP in RUQ and R flank, nonperitoneal, with +murphy's. No lower abd tenderness, no vaginal complaints, doubt need for pelvic exam. DDx includes biliary/gallbladder pathology vs kidney pathology. Will get CBC w/diff, CMP, lipase, U/A, Upreg, and abd U/S. Will give zofran, pepcid, fluids, and GI cocktail; pt declines wanting anything else for now. Will reassess shortly  1:27 PM CBC w/diff WNL. CMP WNL. Lipase WNL. U/A completely unremarkable. Upreg neg. U/S showing borderline gallbladder wall  thickening without stones/sludge but recommends HIDA scan if gallbladder dysfunction suspected; also finds probable hemangioma on R hepatic lobe recommending 6 month f/up. Given lack of LFT changes, doubt need for emergent HIDA scan today, or emergent surgical consult. May need to have outpatient GI consult and surgical consult to discuss findings and for ongoing management. Pt feeling much better and tolerating PO well. Will d/c home with zantac and zofran rx, recommend continuation of prilosec, tums/maalox PRN, diet modifications for GERD advised, f/up with surgery and GI for recheck and ongoing management. I explained the diagnosis and have given explicit precautions to return to the ER including for any other new or worsening symptoms. The patient understands and accepts the medical plan as it's been dictated and I have answered their questions. Discharge instructions concerning home care and prescriptions have been given. The patient is STABLE and is discharged to home in good condition.   Final Clinical Impressions(s) / ED Diagnoses   Final diagnoses:  RUQ abdominal pain  Biliary colic   Hematuria, unspecified type  Nausea  Gastroesophageal reflux disease, esophagitis presence not specified  Biliary dyskinesia    New Prescriptions New Prescriptions   ONDANSETRON (ZOFRAN ODT) 4 MG DISINTEGRATING TABLET    Take 1 tablet (4 mg total) by mouth every 8 (eight) hours as needed for nausea or vomiting.   RANITIDINE (ZANTAC) 150 MG TABLET    Take 1 tablet (150 mg total) by mouth 2 (two) times daily.     Zacarias Pontes, PA-C 06/20/16 [place], MD 06/20/16 1921

## 2016-06-20 NOTE — Discharge Instructions (Signed)
Your abdominal pain is likely from gastritis or an ulcer, but could also be from your gallbladder not functioning appropriately. Continue home prilosec. You will need to take zantac as directed, and avoid spicy/fatty/acidic foods, avoid soda/coffee/tea/alcohol. Avoid laying down flat within 30 minutes of eating. Avoid NSAIDs like ibuprofen/aleve/motrin/etc on an empty stomach. May consider using over the counter tums/maalox as needed for additional relief. Use zofran as directed as needed for nausea. Use tylenol as needed for pain. Follow up with the gastroenterologist in 1-2 weeks for ongoing evaluation of your abdominal pain as well as to follow up with the results from your ultrasound (shows a mass on your liver which is likely benign but needs repeat ultrasound in 6 months). Also follow up with the surgeon in 1-2 weeks for recheck symptoms and to discuss possibly having your gallbladder removed. Follow up with your regular doctor in one week for recheck of symptoms. Return to the ER for changes or worsening symptoms.  Abdominal (belly) pain can be caused by many things. Your caregiver performed an examination and possibly ordered blood/urine tests and imaging (CT scan, x-rays, ultrasound). Many cases can be observed and treated at home after initial evaluation in the emergency department. Even though you are being discharged home, abdominal pain can be unpredictable. Therefore, you need a repeated exam if your pain does not resolve, returns, or worsens. Most patients with abdominal pain don't have to be admitted to the hospital or have surgery, but serious problems like appendicitis and gallbladder attacks can start out as nonspecific pain. Many abdominal conditions cannot be diagnosed in one visit, so follow-up evaluations are very important. SEEK IMMEDIATE MEDICAL ATTENTION IF YOU DEVELOP ANY OF THE FOLLOWING SYMPTOMS: The pain does not go away or becomes severe.  A temperature above 101 develops.    Repeated vomiting occurs (multiple episodes).  The pain becomes localized to portions of the abdomen. The right side could possibly be appendicitis. In an adult, the left lower portion of the abdomen could be colitis or diverticulitis.  Blood is being passed in stools or vomit (bright red or black tarry stools).  Return also if you develop chest pain, difficulty breathing, dizziness or fainting, or become confused, poorly responsive, or inconsolable (young children). The constipation stays for more than 4 days.  There is belly (abdominal) or rectal pain.  You do not seem to be getting better.

## 2016-06-20 NOTE — ED Notes (Signed)
Unable to get patient to sign d/c papers.  Signature pad in room not working.  Reviewed d/c paperwork with patient she verbalized understanding.

## 2016-06-20 NOTE — ED Notes (Signed)
Patient d/c'd self care.  F/U and medications reviewed.  Patient verbalized understanding. 

## 2016-06-20 NOTE — ED Triage Notes (Addendum)
Per pt, states right flank pain and diarrhea since last Wednesday-no dysuria although saw blood in urine this am

## 2016-06-20 NOTE — ED Notes (Signed)
Unable to obtain VS patient was dressed and in heavy coat.  Patient advised that wanted to leave she was hungry.

## 2016-06-20 NOTE — ED Notes (Signed)
RN starting IV and collecting blood work 

## 2016-06-21 ENCOUNTER — Encounter: Payer: Self-pay | Admitting: Gastroenterology

## 2016-06-26 ENCOUNTER — Encounter: Payer: Self-pay | Admitting: *Deleted

## 2016-06-30 ENCOUNTER — Encounter: Payer: Self-pay | Admitting: Gastroenterology

## 2016-06-30 ENCOUNTER — Ambulatory Visit (INDEPENDENT_AMBULATORY_CARE_PROVIDER_SITE_OTHER): Payer: Medicaid Other | Admitting: Gastroenterology

## 2016-06-30 VITALS — BP 100/70 | HR 76 | Ht 66.0 in | Wt 292.4 lb

## 2016-06-30 DIAGNOSIS — R1011 Right upper quadrant pain: Secondary | ICD-10-CM | POA: Diagnosis not present

## 2016-06-30 DIAGNOSIS — K769 Liver disease, unspecified: Secondary | ICD-10-CM | POA: Diagnosis not present

## 2016-06-30 DIAGNOSIS — K219 Gastro-esophageal reflux disease without esophagitis: Secondary | ICD-10-CM | POA: Diagnosis not present

## 2016-06-30 NOTE — Progress Notes (Addendum)
06/30/2016 Kathryn Stevenson [id number] 10-14-1975   HISTORY OF PRESENT ILLNESS:  This is a pleasant 40 year old female who is new to our practice. She has been referred here by her PCP, Cammie Sickle, FNP, for evaluation of right upper quadrant abdominal pain. She tells me that this developed about 2-3 weeks ago. It has been intermittent since that time, but occurring about 2 or 3 times a day. Gets severe up to an 8 or 9 out of 10 on the pain scale. Lasts for about 20-40 minutes before resolving completely until it occurs again.  Notices the pain mostly an hour or so after eating and at nighttime.  Describes it as a combination of sharp and dull in nature. Has had some vomiting initially, but none recently. Feels bloated on the right side of her abdomen as well. Recent CBC, CMP, lipase were within normal limits. Ultrasound of abdomen showed borderline gallbladder wall thickening with no evidence of stones or sludge nor positive sonographic Murphy sign. It also showed a probable hemangioma posteriorly in the right hepatic lobe measuring 1.9 cm in greatest dimension. They're recommending follow-up the liver in 6 months with ultrasound.  She has acid reflux for which she takes omeprazole 40 mg daily and Zantac 150 mg twice daily for last couple of years. Symptoms are well-controlled on those medications.    Past Medical History:  Diagnosis Date  . Acid reflux   . Dermatitis   . Fracture, ankle   . Morbid obesity ([place])    Past Surgical History:  Procedure Laterality Date  . ANKLE SURGERY Left   . INDUCED ABORTION    . MOUTH SURGERY    . TUBAL LIGATION      reports that she has been smoking Cigarettes.  She has been smoking about 0.50 packs per day. She has never used smokeless tobacco. She reports that she drinks alcohol. She reports that she does not use drugs. family history includes Heart attack in her maternal grandmother; Hypertension in her mother; Ovarian cancer in her maternal  grandmother. No Known Allergies    Outpatient Encounter Prescriptions as of 06/30/2016  Medication Sig  . guaiFENesin (MUCINEX) 600 MG 12 hr tablet Take 1,200 mg by mouth every 4 (four) hours as needed for cough or to loosen phlegm.  Marland Kitchen omeprazole (PRILOSEC) 40 MG capsule Take 40 mg by mouth daily.  . ondansetron (ZOFRAN ODT) 4 MG disintegrating tablet Take 1 tablet (4 mg total) by mouth every 8 (eight) hours as needed for nausea or vomiting.  . ranitidine (ZANTAC) 150 MG tablet Take 1 tablet (150 mg total) by mouth 2 (two) times daily.  Marland Kitchen triamcinolone cream (KENALOG) 0.1 % Apply 1 application topically 2 (two) times daily. (Patient taking differently: Apply 1 application topically 2 (two) times daily as needed. )  . [DISCONTINUED] omeprazole (PRILOSEC) 20 MG capsule TAKE 1 CAPSULE EVERY DAY (Patient not taking: Reported on 06/20/2016)  . [DISCONTINUED] varenicline (CHANTIX STARTING MONTH PAK) 0.5 MG X 11 & 1 MG X 42 tablet Take one 0.5 mg tablet by mouth once daily for 3 days, then increase to one 0.5 mg tablet twice daily for 4 days, then increase to one 1 mg tablet twice daily. (Patient not taking: Reported on 06/20/2016)   No facility-administered encounter medications on file as of 06/30/2016.      REVIEW OF SYSTEMS  : All other systems reviewed and negative except where noted in the History of Present Illness.   PHYSICAL EXAM: BP 100/70  Pulse 76   Ht 5\' 6"  (1.676 m)   Wt 292 lb 6.4 oz (132.6 kg)   LMP 06/09/2016   BMI 47.19 kg/m  General: Well developed black female in no acute distress Head: Normocephalic and atraumatic Eyes:  Sclerae anicteric, conjunctiva pink. Ears: Normal auditory acuity Lungs: Clear throughout to auscultation Heart: Regular rate and rhythm Abdomen: Soft, non-distended.  Normal bowel sounds.  RUQ TTP. Musculoskeletal: Symmetrical with no gross deformities  Skin: No lesions on visible extremities Extremities: No edema  Neurological: Alert oriented x 4,  grossly non-focal Psychological:  Alert and cooperative. Normal mood and affect  ASSESSMENT AND PLAN: -RUQ abdominal pain:  Onset 2-3 weeks ago, intermittent, severe.  With some associated right-sided abdominal bloating and nausea.  Ultrasound gallbladder ok.  Will plan for HIDA scan to rule out biliary dysfunction.  I have asked her to try to follow a bland/brat diet in the interim. -Liver lesion:  Seen on ultrasound.  Likely a hemangioma, but they're recommending repeat ultrasound in 6 months. We will plan for that. -GERD:  Well controlled on omeprazole 40 mg daily and ranitidine 150 mg twice daily, all of which she has been taking for couple of years.  CC:  Dorena Dew, FNP   Thank you for sending this case to me. I have reviewed the entire note, and the outlined plan seems appropriate.  Sounds like biliary colic.  If scan normal, please set her up for EGD.

## 2016-06-30 NOTE — Patient Instructions (Signed)
Please follow a low fat/bland diet for now.  You will be due for repeat abdominal ultrasound around 12/2016 for follow up of a liver lesion. We will call you when it gets closer to that time to schedule.   You have been scheduled for a HIDA scan at [facility] (1st floor) on [place], 07/07/16. Please arrive 15 minutes prior to your scheduled appointment at  [id number] am. Make certain not to have anything to eat or drink at least 6 hours prior to your test. Should this appointment date or time not work well for you, please call radiology scheduling at [phone].  _____________________________________________________________________ hepatobiliary (HIDA) scan is an imaging procedure used to diagnose problems in the liver, gallbladder and bile ducts. In the HIDA scan, a radioactive chemical or tracer is injected into a vein in your arm. The tracer is handled by the liver like bile. Bile is a fluid produced and excreted by your liver that helps your digestive system break down fats in the foods you eat. Bile is stored in your gallbladder and the gallbladder releases the bile when you eat a meal. A special nuclear medicine scanner (gamma camera) tracks the flow of the tracer from your liver into your gallbladder and small intestine.  During your HIDA scan  You'll be asked to change into a hospital gown before your HIDA scan begins. Your health care team will position you on a table, usually on your back. The radioactive tracer is then injected into a vein in your arm.The tracer travels through your bloodstream to your liver, where it's taken up by the bile-producing cells. The radioactive tracer travels with the bile from your liver into your gallbladder and through your bile ducts to your small intestine.You may feel some pressure while the radioactive tracer is injected into your vein. As you lie on the table, a special gamma camera is positioned over your abdomen taking pictures of the tracer as it moves  through your body. The gamma camera takes pictures continually for about an hour. You'll need to keep still during the HIDA scan. This can become uncomfortable, but you may find that you can lessen the discomfort by taking deep breaths and thinking about other things. Tell your health care team if you're uncomfortable. The radiologist will watch on a computer the progress of the radioactive tracer through your body. The HIDA scan may be stopped when the radioactive tracer is seen in the gallbladder and enters your small intestine. This typically takes about an hour. In some cases extra imaging will be performed if original images aren't satisfactory, if morphine is given to help visualize the gallbladder or if the medication CCK is given to look at the contraction of the gallbladder. This test typically takes 2 hours to complete. ________________________________________________________________________

## 2016-07-07 ENCOUNTER — Telehealth: Payer: Self-pay | Admitting: Gastroenterology

## 2016-07-07 ENCOUNTER — Encounter (HOSPITAL_COMMUNITY)
Admission: RE | Admit: 2016-07-07 | Discharge: 2016-07-07 | Disposition: A | Discharge status: Home / Self Care | Payer: Medicaid Other | Source: Ambulatory Visit | Attending: Gastroenterology | Admitting: Gastroenterology

## 2016-07-07 DIAGNOSIS — R1011 Right upper quadrant pain: Secondary | ICD-10-CM | POA: Diagnosis not present

## 2016-07-07 MED ORDER — TECHNETIUM TC 99M MEBROFENIN IV KIT
5.5000 | PACK | Freq: Once | INTRAVENOUS | Status: AC | PRN
  Administered 2016-07-07: 5.5 via INTRAVENOUS

## 2016-07-07 NOTE — Telephone Encounter (Signed)
Pt notified that the results have not been reviewed and we will call as soon as available.

## 2016-07-12 ENCOUNTER — Telehealth: Payer: Self-pay | Admitting: Gastroenterology

## 2016-07-12 NOTE — Telephone Encounter (Signed)
Pt advised that Kathryn Stevenson has not reviewed the results and we will call when available.

## 2016-08-21 ENCOUNTER — Emergency Department (HOSPITAL_COMMUNITY)
Admission: EM | Admit: 2016-08-21 | Discharge: 2016-08-21 | Disposition: A | Discharge status: Home / Self Care | Payer: Medicaid Other | Attending: Emergency Medicine | Admitting: Emergency Medicine

## 2016-08-21 ENCOUNTER — Encounter (HOSPITAL_COMMUNITY): Payer: Self-pay | Admitting: Emergency Medicine

## 2016-08-21 DIAGNOSIS — F17210 Nicotine dependence, cigarettes, uncomplicated: Secondary | ICD-10-CM | POA: Insufficient documentation

## 2016-08-21 DIAGNOSIS — A599 Trichomoniasis, unspecified: Secondary | ICD-10-CM | POA: Insufficient documentation

## 2016-08-21 DIAGNOSIS — R300 Dysuria: Secondary | ICD-10-CM | POA: Insufficient documentation

## 2016-08-21 DIAGNOSIS — L02411 Cutaneous abscess of right axilla: Secondary | ICD-10-CM | POA: Insufficient documentation

## 2016-08-21 DIAGNOSIS — L0291 Cutaneous abscess, unspecified: Secondary | ICD-10-CM

## 2016-08-21 LAB — URINALYSIS, ROUTINE W REFLEX MICROSCOPIC
Bacteria, UA: NONE SEEN
Bilirubin Urine: NEGATIVE
Glucose, UA: NEGATIVE mg/dL
Ketones, ur: NEGATIVE mg/dL
Nitrite: NEGATIVE
Protein, ur: NEGATIVE mg/dL
Specific Gravity, Urine: 1.025 (ref 1.005–1.030)
pH: 5 (ref 5.0–8.0)

## 2016-08-21 MED ORDER — LIDOCAINE HCL (PF) 1 % IJ SOLN
INTRAMUSCULAR | Status: AC
  Filled 2016-08-21: qty 5

## 2016-08-21 MED ORDER — HYDROCODONE-ACETAMINOPHEN 5-325 MG PO TABS: 1.0000 | ORAL_TABLET | Freq: Four times a day (QID) | ORAL | 0 refills | Status: DC | PRN

## 2016-08-21 MED ORDER — LIDOCAINE HCL (PF) 1 % IJ SOLN
5.0000 mL | Freq: Once | INTRAMUSCULAR | Status: DC
  Filled 2016-08-21: qty 5

## 2016-08-21 MED ORDER — METRONIDAZOLE 500 MG PO TABS: 500.0000 mg | ORAL_TABLET | Freq: Two times a day (BID) | ORAL | 0 refills | Status: DC

## 2016-08-21 MED ORDER — CEPHALEXIN 500 MG PO CAPS: 500.0000 mg | ORAL_CAPSULE | Freq: Four times a day (QID) | ORAL | 0 refills | Status: DC

## 2016-08-21 NOTE — ED Notes (Signed)
Pt ambulated to room from waiting room, tolerated well. Pt placed in gown.

## 2016-08-21 NOTE — ED Provider Notes (Signed)
[facility] DEPT Provider Note   CSN: [id number] Arrival date & time: 08/21/16  1550   By signing my name below, I, Charolotte Eke, attest that this documentation has been prepared under the direction and in the presence of Montine Circle, PA-C. Electronically Signed: Charolotte Eke, Scribe. 08/21/16. 5:41 PM.    History   Chief Complaint Chief Complaint  Patient presents with  . Abscess  . Dysuria    HPI Kathryn Stevenson is a 41 y.o. female with h/o trichomonas who presents to the Emergency Department complaining of a hard, painful abscess on right axillary that began to appear 4 days ago. Pt has associated subjective fever, nausea. Pt has taken hot shower without relief. Pt reports new clothing. Pt is concerned about STD due to having dysuria and similar symptoms as the last time she had trichomonas. She reports no new sexual partners. She denies vaginal discharge, vaginal smell.   The history is provided by the patient. No language interpreter was used.    Past Medical History:  Diagnosis Date  . Acid reflux   . Dermatitis   . Fracture, ankle   . Morbid obesity [facility])     Patient Active Problem List   Diagnosis Date Noted  . RUQ abdominal pain 06/30/2016  . Liver lesion 06/30/2016  . Gastroesophageal reflux disease 06/30/2016  . Morbid obesity ([place]) 09/20/2015  . Severe obesity (BMI >= 40) ([place]) 09/20/2015  . Dermatitis 09/20/2015    Past Surgical History:  Procedure Laterality Date  . ANKLE SURGERY Left   . INDUCED ABORTION    . MOUTH SURGERY    . TUBAL LIGATION      OB History    Gravida Para Term Preterm AB Living   6 2 2   4 2    SAB TAB Ectopic Multiple Live Births   1 3             Home Medications    Prior to Admission medications   Medication Sig Start Date End Date Taking? Authorizing Provider  guaiFENesin (MUCINEX) 600 MG 12 hr tablet Take 1,200 mg by mouth every 4 (four) hours as needed for cough or to loosen phlegm.    Historical Provider, MD    omeprazole (PRILOSEC) 40 MG capsule Take 40 mg by mouth daily. 04/13/16   Historical Provider, MD  ondansetron (ZOFRAN ODT) 4 MG disintegrating tablet Take 1 tablet (4 mg total) by mouth every 8 (eight) hours as needed for nausea or vomiting. 06/20/16   Mercedes Street, PA-C  ranitidine (ZANTAC) 150 MG tablet Take 1 tablet (150 mg total) by mouth 2 (two) times daily. 06/20/16   Mercedes Street, PA-C  triamcinolone cream (KENALOG) 0.1 % Apply 1 application topically 2 (two) times daily. Patient taking differently: Apply 1 application topically 2 (two) times daily as needed.  09/20/15   Dorena Dew, FNP    Family History Family History  Problem Relation Age of Onset  . Hypertension Mother   . Ovarian cancer Maternal Grandmother   . Heart attack Maternal Grandmother   . Esophageal cancer Neg Hx   . Colon cancer Neg Hx   . Pancreatic cancer Neg Hx   . Stomach cancer Neg Hx   . Liver disease Neg Hx     Social History Social History  Substance Use Topics  . Smoking status: Current Every Day Smoker    Packs/day: 0.50    Types: Cigarettes  . Smokeless tobacco: Never Used  . Alcohol use Yes  Comment: occas       Allergies   Patient has no known allergies.   Review of Systems Review of Systems  Constitutional: Positive for fever.  Gastrointestinal: Positive for nausea.  Genitourinary: Positive for dysuria. Negative for vaginal discharge.     Physical Exam Updated Vital Signs BP 138/81 (BP Location: Right Arm)   Pulse 80   Temp 99.8 F (37.7 C) (Oral)   Resp 18   SpO2 99%   Physical Exam Constitutional: Pt is oriented to person, place, and time. Pt appears well-developed and well-nourished. No distress.  HENT:  Head: Normocephalic and atraumatic.  Eyes: Conjunctivae are normal. No scleral icterus.  Neck: Normal range of motion.  Cardiovascular: Normal rate, regular rhythm and intact distal pulses.   Pulmonary/Chest: Effort normal and breath sounds normal.   Abdominal: Soft. Pt exhibits no distension. There is no tenderness.  Lymphadenopathy:    Pt has no cervical adenopathy.  Neurological: Pt is alert and oriented to person, place, and time.  Skin: Skin is warm and dry. Pt is not diaphoretic. There is erythema and moderate induration to the right axilla approximately 2 x 3 cm.  Psychiatric: Pt has a normal mood and affect.  Nursing note and vitals reviewed.    ED Treatments / Results   DIAGNOSTIC STUDIES: Oxygen Saturation is 99% on room air, normal by my interpretation.    COORDINATION OF CARE: 5:24 PM Discussed treatment plan with pt at bedside and pt agreed to plan. Pt was offered pelvic exam for trichomonas, but due to the lack of vaginal symptoms she decided not to have a pelvic exam. UA shows positive trichomonas today.    Labs (all labs ordered are listed, but only abnormal results are displayed) Labs Reviewed  URINALYSIS, ROUTINE W REFLEX MICROSCOPIC    EKG  EKG Interpretation None       Radiology No results found.  Procedures Procedures (including critical care time) INCISION AND DRAINAGE PROCEDURE NOTE: Patient identification was confirmed and verbal consent was obtained. This procedure was performed by Montine Circle, PA-C at 5:59 PM. Site: Right axilla Sterile procedures observed Needle size: 25-gauge Anesthetic used (type and amt): 7 mL lidocaine without epinephrine Blade size: 11 Drainage: Copious Complexity: Complex Packing used Quarter inch iodoform Site anesthetized, incision made over site, wound drained and explored loculations, rinsed with copious amounts of normal saline, wound packed with sterile gauze, covered with dry, sterile dressing.  Pt tolerated procedure well without complications.  Instructions for care discussed verbally and pt provided with additional written instructions for homecare and f/u.    Medications Ordered in ED Medications - No data to display   Initial  Impression / Assessment and Plan / ED Course  I have reviewed the triage vital signs and the nursing notes.  Pertinent labs & imaging results that were available during my care of the patient were reviewed by me and considered in my medical decision making (see chart for details).     Patient with developing abscess to the right axilla. This was incised and drained in the emergency department. Packing material was placed to ensure that the wound stays open for adequate drainage. Patient will be placed on antibiotics because she has a small amount of cellulitic changes around the abscess. Her urinalysis is also consistent with trichomonas. Will give her a seven-day course of Flagyl for this. She has tried a one-time treatment in the past and has failed. She denies any vaginal discharge bleeding. She declines pelvic exam today.  Final Clinical Impressions(s) / ED Diagnoses   Final diagnoses:  Abscess  Trichomoniasis  Dysuria    New Prescriptions Discharge Medication List as of 08/21/2016  6:32 PM    START taking these medications   Details  cephALEXin (KEFLEX) 500 MG capsule Take 1 capsule (500 mg total) by mouth 4 (four) times daily., Starting Mon 08/21/2016, Print    HYDROcodone-acetaminophen (NORCO/VICODIN) 5-325 MG tablet Take 1-2 tablets by mouth every 6 (six) hours as needed., Starting Mon 08/21/2016, Print    metroNIDAZOLE (FLAGYL) 500 MG tablet Take 1 tablet (500 mg total) by mouth 2 (two) times daily., Starting Mon 08/21/2016, Print       I personally performed the services described in this documentation, which was scribed in my presence. The recorded information has been reviewed and is accurate.       Montine Circle, PA-C 08/21/16 1901    Tanna Furry, MD 09/06/16 1149

## 2016-08-21 NOTE — Discharge Instructions (Signed)
Pull the packing material out in 2 days.   Take antibiotics as directed.  Return to the ER if your symptoms worsen.

## 2016-08-21 NOTE — ED Notes (Signed)
Large indurated area to the axilla, warm, red.

## 2016-08-21 NOTE — ED Notes (Signed)
Dressing applied to under R arm

## 2016-08-21 NOTE — ED Triage Notes (Signed)
Pt sts right axillary abscess with hx of same; pt sts dysuria that typically means she has an STD per pt

## 2016-08-30 ENCOUNTER — Emergency Department (HOSPITAL_COMMUNITY)
Admission: EM | Admit: 2016-08-30 | Discharge: 2016-08-30 | Disposition: A | Discharge status: Home / Self Care | Payer: Medicaid Other | Attending: Emergency Medicine | Admitting: Emergency Medicine

## 2016-08-30 ENCOUNTER — Encounter (HOSPITAL_COMMUNITY): Payer: Self-pay

## 2016-08-30 DIAGNOSIS — L02413 Cutaneous abscess of right upper limb: Secondary | ICD-10-CM | POA: Insufficient documentation

## 2016-08-30 DIAGNOSIS — L0291 Cutaneous abscess, unspecified: Secondary | ICD-10-CM

## 2016-08-30 DIAGNOSIS — F17210 Nicotine dependence, cigarettes, uncomplicated: Secondary | ICD-10-CM | POA: Insufficient documentation

## 2016-08-30 DIAGNOSIS — Z79899 Other long term (current) drug therapy: Secondary | ICD-10-CM | POA: Diagnosis not present

## 2016-08-30 MED ORDER — CEPHALEXIN 500 MG PO CAPS: 500.0000 mg | ORAL_CAPSULE | Freq: Four times a day (QID) | ORAL | 0 refills | Status: DC

## 2016-08-30 MED ORDER — SULFAMETHOXAZOLE-TRIMETHOPRIM 800-160 MG PO TABS: 1.0000 | ORAL_TABLET | Freq: Two times a day (BID) | ORAL | 0 refills | Status: DC

## 2016-08-30 MED ORDER — IBUPROFEN 600 MG PO TABS: 600.0000 mg | ORAL_TABLET | Freq: Four times a day (QID) | ORAL | 0 refills | Status: DC | PRN

## 2016-08-30 MED ORDER — TRAMADOL HCL 50 MG PO TABS: 50.0000 mg | ORAL_TABLET | Freq: Four times a day (QID) | ORAL | 0 refills | Status: DC | PRN

## 2016-08-30 MED ORDER — OXYCODONE-ACETAMINOPHEN 5-325 MG PO TABS
1.0000 | ORAL_TABLET | Freq: Once | ORAL | Status: AC
  Administered 2016-08-30: 1 via ORAL
  Filled 2016-08-30: qty 1

## 2016-08-30 MED ORDER — LIDOCAINE HCL (PF) 1 % IJ SOLN
2.0000 mL | Freq: Once | INTRAMUSCULAR | Status: DC
Start: 2016-08-30 — End: 2016-08-30
  Filled 2016-08-30: qty 2

## 2016-08-30 NOTE — Discharge Planning (Signed)
Pt up for discharge. EDCM reviewed chart for possible CM needs.  No needs identified or communicated.  

## 2016-08-30 NOTE — Discharge Instructions (Signed)
Continue to keep area clean and dry. Do not soak area for the next 24 hours.  Remove packing in 2 days. Take Ibuprofen as prescribed for mild to moderate pain--take with food to prevent GI upset. Take tramadol as prescribed for severe pain--caution may cause sedation--do not drink alcohol, drive, or operate machinery while taking. Call to schedule a follow up appointment with your primary care doctor. Return to ER if you experience fevers, unexplained weight loss, dizziness, vision or gait changes, chest pain, shortness of breath, abdominal pain, nausea/vomiting/diarrhea, extremity numbness/tingling/weakness, red streaking up arm, increased redness/swelling/warmth to area, worsening symptoms, or any additional concerns.

## 2016-08-30 NOTE — ED Triage Notes (Signed)
Recurrent abscess under right arm. PT reports using warm compresses and showers to attempt to bring abscess to a head with no results. PT was just seen 1 week ago for different abscess under same arm

## 2016-08-30 NOTE — ED Provider Notes (Signed)
[facility] DEPT Provider Note   CSN: [id number] Arrival date & time: 08/30/16  9326     History   Chief Complaint Chief Complaint  Patient presents with  . Abscess    HPI Kathryn Stevenson is a 41 y.o. female.  Pt is a 41 y/o F with PMHx of gerd who presents to ED for abscess to R axilla, onset 4 days ago, has tried warm compresses without significant relief. +hx of abscesses to R axilla, recently evaluated for another area under R axilla I&D'd on 08/21/16, states she has completed course of keflex with relief in previous area. Denies hx of MRSA. +chills. Denies fevers, dizziness, Cp, SOB, cough, abd pain, n/v/d, extremity numbness/tingling/weakness, or any additional concerns. No anticoag use.       Past Medical History:  Diagnosis Date  . Acid reflux   . Dermatitis   . Fracture, ankle   . Morbid obesity [facility])     Patient Active Problem List   Diagnosis Date Noted  . RUQ abdominal pain 06/30/2016  . Liver lesion 06/30/2016  . Gastroesophageal reflux disease 06/30/2016  . Morbid obesity ([place]) 09/20/2015  . Severe obesity (BMI >= 40) ([place]) 09/20/2015  . Dermatitis 09/20/2015    Past Surgical History:  Procedure Laterality Date  . ANKLE SURGERY Left   . INDUCED ABORTION    . MOUTH SURGERY    . TUBAL LIGATION      OB History    Gravida Para Term Preterm AB Living   6 2 2   4 2    SAB TAB Ectopic Multiple Live Births   1 3             Home Medications    Prior to Admission medications   Medication Sig Start Date End Date Taking? Authorizing Provider  cephALEXin (KEFLEX) 500 MG capsule Take 1 capsule (500 mg total) by mouth 4 (four) times daily. 08/21/16   Montine Circle, PA-C  guaiFENesin (MUCINEX) 600 MG 12 hr tablet Take 1,200 mg by mouth every 4 (four) hours as needed for cough or to loosen phlegm.    Historical Provider, MD  HYDROcodone-acetaminophen (NORCO/VICODIN) 5-325 MG tablet Take 1-2 tablets by mouth every 6 (six) hours as needed. 08/21/16   Montine Circle, PA-C  metroNIDAZOLE (FLAGYL) 500 MG tablet Take 1 tablet (500 mg total) by mouth 2 (two) times daily. 08/21/16   Montine Circle, PA-C  omeprazole (PRILOSEC) 40 MG capsule Take 40 mg by mouth daily. 04/13/16   Historical Provider, MD  ondansetron (ZOFRAN ODT) 4 MG disintegrating tablet Take 1 tablet (4 mg total) by mouth every 8 (eight) hours as needed for nausea or vomiting. 06/20/16   Mercedes Street, PA-C  ranitidine (ZANTAC) 150 MG tablet Take 1 tablet (150 mg total) by mouth 2 (two) times daily. 06/20/16   Mercedes Street, PA-C  triamcinolone cream (KENALOG) 0.1 % Apply 1 application topically 2 (two) times daily. Patient taking differently: Apply 1 application topically 2 (two) times daily as needed.  09/20/15   Dorena Dew, FNP    Family History Family History  Problem Relation Age of Onset  . Hypertension Mother   . Ovarian cancer Maternal Grandmother   . Heart attack Maternal Grandmother   . Esophageal cancer Neg Hx   . Colon cancer Neg Hx   . Pancreatic cancer Neg Hx   . Stomach cancer Neg Hx   . Liver disease Neg Hx     Social History Social History  Substance Use Topics  .  Smoking status: Current Every Day Smoker    Packs/day: 0.50    Types: Cigarettes  . Smokeless tobacco: Never Used  . Alcohol use Yes     Comment: occas       Allergies   Patient has no known allergies.   Review of Systems Review of Systems  Constitutional: Positive for chills. Negative for fever and unexpected weight change.  Respiratory: Negative for cough and shortness of breath.   Cardiovascular: Negative for chest pain.  Gastrointestinal: Negative for abdominal pain, diarrhea, nausea and vomiting.  Musculoskeletal: Negative for back pain, neck pain and neck stiffness.  Skin:       +Abscess  Neurological: Negative for dizziness, weakness, numbness and headaches.  All other systems reviewed and are negative.    Physical Exam Updated Vital Signs BP 109/67 (BP Location: Left  Arm)   Pulse 65   Temp 97.9 F (36.6 C) (Oral)   Resp 18   Ht 5\' 6"  (1.676 m)   Wt 127 kg   LMP 08/17/2016   SpO2 100%   BMI 45.19 kg/m   Physical Exam  Constitutional: She is oriented to person, place, and time. She appears well-developed and well-nourished.  HENT:  Head: Normocephalic and atraumatic.  Right Ear: Tympanic membrane and ear canal normal.  Left Ear: Tympanic membrane and ear canal normal.  Nose: Nose normal.  Mouth/Throat: Uvula is midline, oropharynx is clear and moist and mucous membranes are normal.  Eyes: Conjunctivae and EOM are normal. Pupils are equal, round, and reactive to light.  Neck: Normal range of motion.  Cardiovascular: Normal rate, regular rhythm, normal heart sounds and intact distal pulses.  Exam reveals no gallop and no friction rub.   No murmur heard. Pulmonary/Chest: Effort normal and breath sounds normal. She has no wheezes. She has no rales.  Abdominal: Soft. Bowel sounds are normal. There is no tenderness. There is no rebound and no guarding.  Musculoskeletal: Normal range of motion.  Full active Rom to bilateral Ue. Radial pulses 2+, cap refill < 2sec.   Neurological: She is alert and oriented to person, place, and time.  Skin: Skin is warm and dry.  +4cmx2cm abscess to R axilla with surrounding induration. No active bleeding or drainage.  Psychiatric: She has a normal mood and affect. Her behavior is normal.  Nursing note and vitals reviewed.    ED Treatments / Results  Labs (all labs ordered are listed, but only abnormal results are displayed) Labs Reviewed - No data to display  EKG  EKG Interpretation None       Radiology No results found.  Procedures .Marland KitchenIncision and Drainage Date/Time: 08/30/2016 7:51 AM Performed by: Ulice Bold Authorized by: Ulice Bold   Consent:    Consent obtained:  Verbal   Consent given by:  Patient   Risks discussed:  Bleeding, damage to other organs, infection, incomplete drainage  and pain   Alternatives discussed:  No treatment, delayed treatment and alternative treatment Universal protocol:    Procedure explained and questions answered to patient or proxy's satisfaction: yes     Relevant documents present and verified: yes     Required blood products, implants, devices, and special equipment available: yes     Site/side marked: yes     Immediately prior to procedure a time out was called: yes     Patient identity confirmed:  Verbally with patient, arm band and hospital-assigned identification number Location:    Type:  Abscess   Size:  4cmx2cm  Location:  Upper extremity   Upper extremity location:  Arm   Arm location:  R upper arm Pre-procedure details:    Skin preparation:  Chloraprep Anesthesia (see MAR for exact dosages):    Anesthesia method:  Local infiltration   Local anesthetic:  Lidocaine 1% w/o epi Procedure type:    Complexity:  Simple Procedure details:    Incision types:  Single straight   Incision depth:  Dermal   Scalpel blade:  11   Wound management:  Probed and deloculated, irrigated with saline and extensive cleaning   Drainage:  Purulent   Drainage amount:  Moderate   Wound treatment:  Wound left open   Packing materials:  1/4 in gauze Post-procedure details:    Patient tolerance of procedure:  Tolerated well, no immediate complications   (including critical care time)  Medications Ordered in ED Medications  oxyCODONE-acetaminophen (PERCOCET/ROXICET) 5-325 MG per tablet 1 tablet (not administered)  lidocaine (PF) (XYLOCAINE) 1 % injection 2 mL (not administered)     Initial Impression / Assessment and Plan / ED Course  I have reviewed the triage vital signs and the nursing notes.  Pertinent labs & imaging results that were available during my care of the patient were reviewed by me and considered in my medical decision making (see chart for details).   Pt is a 41 y/o M, afebrile, who presents to ED for abscess to R axilla  with mild surrounding cellulitis. No evidence of neurovascular compromise. Will plan to I&D, place on antibiotics, and dc with PCP f/u; pt amenable to this plan.   8:10 AM Pt tolerated I&D well, packing placed. [place] controlled substance database reviewed-- 08/21/16 percocet qty 10, 3 days. Discussed wound care, s/s infection, packing removal, discharge instructions, return precautions and follow up;pt verbalizes understanding using verbal teachback and agrees with plan, denies any additional concerns.  The patient was discussed with and seen by Dr. Venora Maples who agrees with the treatment plan.   Final Clinical Impressions(s) / ED Diagnoses   Final diagnoses:  None    New Prescriptions New Prescriptions   No medications on file     Ulice Bold, NP 09/03/16 [place], MD 09/03/16 2129

## 2016-09-22 ENCOUNTER — Ambulatory Visit: Payer: Self-pay | Attending: Internal Medicine

## 2016-10-23 ENCOUNTER — Ambulatory Visit: Payer: Self-pay | Admitting: Family Medicine

## 2016-10-27 ENCOUNTER — Ambulatory Visit (INDEPENDENT_AMBULATORY_CARE_PROVIDER_SITE_OTHER): Payer: Self-pay | Admitting: Family Medicine

## 2016-10-27 ENCOUNTER — Encounter: Payer: Self-pay | Admitting: Family Medicine

## 2016-10-27 VITALS — BP 130/82 | HR 62 | Temp 98.0°F | Resp 16 | Ht 66.0 in | Wt 284.0 lb

## 2016-10-27 DIAGNOSIS — E669 Obesity, unspecified: Secondary | ICD-10-CM

## 2016-10-27 DIAGNOSIS — M25572 Pain in left ankle and joints of left foot: Secondary | ICD-10-CM

## 2016-10-27 DIAGNOSIS — R21 Rash and other nonspecific skin eruption: Secondary | ICD-10-CM

## 2016-10-27 DIAGNOSIS — Z1239 Encounter for other screening for malignant neoplasm of breast: Secondary | ICD-10-CM

## 2016-10-27 DIAGNOSIS — Z1231 Encounter for screening mammogram for malignant neoplasm of breast: Secondary | ICD-10-CM

## 2016-10-27 LAB — POCT URINALYSIS DIP (DEVICE)
Bilirubin Urine: NEGATIVE
Glucose, UA: NEGATIVE mg/dL
Hgb urine dipstick: NEGATIVE
Ketones, ur: NEGATIVE mg/dL
Leukocytes, UA: NEGATIVE
Nitrite: NEGATIVE
Protein, ur: NEGATIVE mg/dL
Specific Gravity, Urine: >=1.03 (ref 1.005–1.030)
Urobilinogen, UA: 0.2 mg/dL (ref 0.0–1.0)
pH: 5.5 (ref 5.0–8.0)

## 2016-10-27 LAB — COMPLETE METABOLIC PANEL WITH GFR
ALT: 13 U/L (ref 6–29)
AST: 17 U/L (ref 10–30)
Albumin: 3.9 g/dL (ref 3.6–5.1)
Alkaline Phosphatase: 61 U/L (ref 33–115)
BUN: 11 mg/dL (ref 7–25)
CO2: 23 mmol/L (ref 20–31)
Calcium: 9 mg/dL (ref 8.6–10.2)
Chloride: 108 mmol/L (ref 98–110)
Creat: 1.09 mg/dL (ref 0.50–1.10)
GFR, Est African American: 72 mL/min (ref >=60)
GFR, Est Non African American: 63 mL/min (ref >=60)
Glucose, Bld: 87 mg/dL (ref 65–99)
Potassium: 4.2 mmol/L (ref 3.5–5.3)
Sodium: 139 mmol/L (ref 135–146)
Total Bilirubin: 0.5 mg/dL (ref 0.2–1.2)
Total Protein: 6.9 g/dL (ref 6.1–8.1)

## 2016-10-27 LAB — CBC WITH DIFFERENTIAL/PLATELET
Basophils Absolute: 0 cells/uL (ref 0–200)
Basophils Relative: 0 %
Eosinophils Absolute: 160 cells/uL (ref 15–500)
Eosinophils Relative: 2 %
HCT: 38.9 % (ref 35.0–45.0)
Hemoglobin: 12.9 g/dL (ref 11.7–15.5)
Lymphocytes Relative: 34 %
Lymphs Abs: 2720 cells/uL (ref 850–3900)
MCH: 30.5 pg (ref 27.0–33.0)
MCHC: 33.2 g/dL (ref 32.0–36.0)
MCV: 92 fL (ref 80.0–100.0)
MPV: 9.4 fL (ref 7.5–12.5)
Monocytes Absolute: 480 cells/uL (ref 200–950)
Monocytes Relative: 6 %
Neutro Abs: 4640 cells/uL (ref 1500–7800)
Neutrophils Relative %: 58 %
Platelets: 285 10*3/uL (ref 140–400)
RBC: 4.23 MIL/uL (ref 3.80–5.10)
RDW: 15.5 % — ABNORMAL HIGH (ref 11.0–15.0)
WBC: 8 10*3/uL (ref 3.8–10.8)

## 2016-10-27 MED ORDER — CLOTRIMAZOLE-BETAMETHASONE 1-0.05 % EX CREA: 1.0000 "application " | TOPICAL_CREAM | Freq: Two times a day (BID) | CUTANEOUS | 0 refills | Status: DC

## 2016-10-27 MED ORDER — OMEPRAZOLE 40 MG PO CPDR: 40.0000 mg | DELAYED_RELEASE_CAPSULE | Freq: Every day | ORAL | 3 refills | Status: DC

## 2016-10-27 MED ORDER — PREDNISONE 20 MG PO TABS: 40.0000 mg | ORAL_TABLET | Freq: Every day | ORAL | 0 refills | Status: DC

## 2016-10-27 MED ORDER — BUPROPION HCL ER (XL) 150 MG PO TB24: 150.0000 mg | ORAL_TABLET | Freq: Every day | ORAL | 3 refills | Status: DC

## 2016-10-27 NOTE — Patient Instructions (Addendum)
As we discussed I am placing you on Wellbutrin 150 mg daily for off label use of appetite suppression. Increase physical activity as tolerated. Wear an ankle brace to protect left ankle from ankle laxity.  Take prednisone 40 mg daily for 5 days for ankle pain likely related to inflammation.  Apply Lotrisone cream to rash twice daily for 2 weeks.    Exercising to Lose Weight Exercising can help you to lose weight. In order to lose weight through exercise, you need to do vigorous-intensity exercise. You can tell that you are exercising with vigorous intensity if you are breathing very hard and fast and cannot hold a conversation while exercising. Moderate-intensity exercise helps to maintain your current weight. You can tell that you are exercising at a moderate level if you have a higher heart rate and faster breathing, but you are still able to hold a conversation. How often should I exercise? Choose an activity that you enjoy and set realistic goals. Your health care provider can help you to make an activity plan that works for you. Exercise regularly as directed by your health care provider. This may include:  Doing resistance training twice each week, such as:  Push-ups.  Sit-ups.  Lifting weights.  Using resistance bands.  Doing a given intensity of exercise for a given amount of time. Choose from these options:  150 minutes of moderate-intensity exercise every week.  75 minutes of vigorous-intensity exercise every week.  A mix of moderate-intensity and vigorous-intensity exercise every week. Children, pregnant women, people who are out of shape, people who are overweight, and older adults may need to consult a health care provider for individual recommendations. If you have any sort of medical condition, be sure to consult your health care provider before starting a new exercise program. What are some activities that can help me to lose weight?  Walking at a rate of at least  4.5 miles an hour.  Jogging or running at a rate of 5 miles per hour.  Biking at a rate of at least 10 miles per hour.  Lap swimming.  Roller-skating or in-line skating.  Cross-country skiing.  Vigorous competitive sports, such as football, basketball, and soccer.  Jumping rope.  Aerobic dancing. How can I be more active in my day-to-day activities?  Use the stairs instead of the elevator.  Take a walk during your lunch break.  If you drive, park your car farther away from work or school.  If you take public transportation, get off one stop early and walk the rest of the way.  Make all of your phone calls while standing up and walking around.  Get up, stretch, and walk around every 30 minutes throughout the day. What guidelines should I follow while exercising?  Do not exercise so much that you hurt yourself, feel dizzy, or get very short of breath.  Consult your health care provider prior to starting a new exercise program.  Wear comfortable clothes and shoes with good support.  Drink plenty of water while you exercise to prevent dehydration or heat stroke. Body water is lost during exercise and must be replaced.  Work out until you breathe faster and your heart beats faster. This information is not intended to replace advice given to you by your health care provider. Make sure you discuss any questions you have with your health care provider. Document Released: 07/08/2010 Document Revised: 11/11/2015 Document Reviewed: 11/06/2013 Elsevier Interactive Patient Education  2017 [employer].

## 2016-10-27 NOTE — Progress Notes (Signed)
Patient ID: Kathryn Stevenson, female    DOB: June 28, 1975, 41 y.o.   MRN: [id number]  PCP: Dorena Dew, FNP  Chief Complaint  Patient presents with  . Ankle Pain    LEFT  . Rash    on breast    Subjective:  HPI Kathryn Stevenson is a 41 y.o. female presents for evaluation of rash under bilateral breast and left ankle pain.  Rash [name] reports development of a rash between breast x 7 days ago. Denies any use of new perfumes, creams, or detergents. No hx yeast or diabetes. Reports increased moisture around her breasts and feels that attributed to rash. The rash itches and burns. Skin is excoriated from patient scratching. Denies use of any OTC medication or ointments. Reports a history of eczema.  Left Ankle Pain Reports history of plates and screws in left ankle following an ankle  fracture in 2010. Recently, she complains of left ankle pain over the last several weeks. Characterized as stiffness and aching. Since her surgery in 2010, reports no pain and or ankle laxity. She denies twisting or inverting ankle recently. Reports chronic swelling since surgery which she has trreated by elevating feet while sitting.   Obesity [name] reports frustration with herself as she had been diligently working to loose weight last year. She reports that she went to a weight loss clinic and was placed on phentermine for appetite suppression. She reports that she incorporated exercise, eliminated fried and sugary foods. Around the Thanksgiving of last year, she completely stopped exercising and resumed eating whatever she desired. At present she is emotionally frustrated as she is struggling with motivation to get back into a routine of physical activity.   Social History   Social History  . Marital status: Single    Spouse name: N/A  . Number of children: N/A  . Years of education: N/A   Occupational History  . Not on file.   Social History Main Topics  . Smoking status: Current Every  Day Smoker    Packs/day: 0.50    Types: Cigarettes  . Smokeless tobacco: Never Used  . Alcohol use Yes     Comment: occas    . Drug use: No  . Sexual activity: Yes    Birth control/ protection: Surgical   Other Topics Concern  . Not on file   Social History Narrative  . No narrative on file    Family History  Problem Relation Age of Onset  . Hypertension Mother   . Ovarian cancer Maternal Grandmother   . Heart attack Maternal Grandmother   . Esophageal cancer Neg Hx   . Colon cancer Neg Hx   . Pancreatic cancer Neg Hx   . Stomach cancer Neg Hx   . Liver disease Neg Hx    Review of Systems See HPI Patient Active Problem List   Diagnosis Date Noted  . RUQ abdominal pain 06/30/2016  . Liver lesion 06/30/2016  . Gastroesophageal reflux disease 06/30/2016  . Morbid obesity ([place]) 09/20/2015  . Severe obesity (BMI >= 40) ([place]) 09/20/2015  . Dermatitis 09/20/2015    No Known Allergies  Prior to Admission medications   Medication Sig Start Date End Date Taking? Authorizing Provider  guaiFENesin (MUCINEX) 600 MG 12 hr tablet Take 1,200 mg by mouth every 4 (four) hours as needed for cough or to loosen phlegm.   Yes [provider]  HYDROcodone-acetaminophen (NORCO/VICODIN) 5-325 MG tablet Take 1-2 tablets by mouth every 6 (six) hours  as needed. 08/21/16  Yes Montine Circle, PA-C  ibuprofen (ADVIL,MOTRIN) 600 MG tablet Take 1 tablet (600 mg total) by mouth every 6 (six) hours as needed. 08/30/16  Yes Wojeck, Bernadene Bell, NP  omeprazole (PRILOSEC) 40 MG capsule Take 40 mg by mouth daily. 04/13/16  Yes [provider]  ondansetron (ZOFRAN ODT) 4 MG disintegrating tablet Take 1 tablet (4 mg total) by mouth every 8 (eight) hours as needed for nausea or vomiting. 06/20/16  Yes Street, [place], PA-C  triamcinolone cream (KENALOG) 0.1 % Apply 1 application topically 2 (two) times daily. Patient taking differently: Apply 1 application topically 2 (two) times daily as needed.   09/20/15  Yes Dorena Dew, FNP    Past Medical, Surgical Family and Social History reviewed and updated.    Objective:   Today's Vitals   10/27/16 1424  BP: 130/82  Pulse: 62  Resp: 16  Temp: 98 F (36.7 C)  TempSrc: Oral  SpO2: 100%  Weight: 284 lb (128.8 kg)  Height: 5\' 6"  (1.676 m)    Wt Readings from Last 3 Encounters:  10/27/16 284 lb (128.8 kg)  08/30/16 280 lb (127 kg)  06/30/16 292 lb 6.4 oz (132.6 kg)    Physical Exam  Constitutional: She is oriented to person, place, and time. She appears well-developed and well-nourished.  HENT:  Head: Normocephalic and atraumatic.  Eyes: Conjunctivae and EOM are normal. Pupils are equal, round, and reactive to light.  Neck: Normal range of motion. Neck supple.  Cardiovascular: Normal rate, regular rhythm, normal heart sounds and intact distal pulses.   Pulmonary/Chest: Effort normal and breath sounds normal.    Abdominal: Soft. Bowel sounds are normal.  Musculoskeletal:       Left ankle: She exhibits swelling. She exhibits normal range of motion. No tenderness.  Neurological: She is alert and oriented to person, place, and time.  Skin: Skin is warm and dry.  Psychiatric: She has a normal mood and affect. Her behavior is normal. Judgment and thought content normal.    Assessment & Plan:  1. Obesity without serious comorbidity, unspecified classification, unspecified obesity type  CBC with Differential, COMPLETE METABOLIC PANEL WITH GFR, Thyroid Panel With TSH Educated that weight loss is a slow process that requires committment  to lifestyle changes and incorporating realistic expectations.  -Implement a physical activity that realistically can engage inconsistently. Recommended 150 minutes weekly, however set a goal that is realistic for you. -Starting you on Wellbutrin 150 mg daily for appetite suppression and to improve mood. Increased mood equals increase motivation. Goals at next follow-up: Lose 5lbs  2. Rash  and nonspecific skin eruption -clotrimazole-betamethasone (Lotrisone) 1 application 2 times daily 7-14 days  3. Acute Left Ankle Pain -Prednisone 40 mg with breakfast x 5 days   4. Screening for breast cancer - MM Digital Screening  RTC: Keep follow-up appointment with Cammie Sickle 12/08/16   Carroll Sage. Kenton Kingfisher, MSN, [facility] [address] [place], [place] [address] [phone]

## 2016-10-28 LAB — THYROID PANEL WITH TSH
Free Thyroxine Index: 2.6 (ref 1.4–3.8)
T3 Uptake: 33 % (ref 22–35)
T4, Total: 7.8 ug/dL (ref 4.5–12.0)
TSH: 1.04 mIU/L

## 2016-12-07 ENCOUNTER — Other Ambulatory Visit: Payer: Self-pay | Admitting: Obstetrics and Gynecology

## 2016-12-07 DIAGNOSIS — Z1231 Encounter for screening mammogram for malignant neoplasm of breast: Secondary | ICD-10-CM

## 2016-12-08 ENCOUNTER — Ambulatory Visit (INDEPENDENT_AMBULATORY_CARE_PROVIDER_SITE_OTHER): Payer: Self-pay | Admitting: Family Medicine

## 2016-12-08 ENCOUNTER — Encounter: Payer: Self-pay | Admitting: Family Medicine

## 2016-12-08 ENCOUNTER — Other Ambulatory Visit (HOSPITAL_COMMUNITY)
Admission: RE | Admit: 2016-12-08 | Discharge: 2016-12-08 | Disposition: A | Discharge status: Home / Self Care | Payer: Self-pay | Source: Ambulatory Visit | Attending: Family Medicine | Admitting: Family Medicine

## 2016-12-08 DIAGNOSIS — Z01419 Encounter for gynecological examination (general) (routine) without abnormal findings: Secondary | ICD-10-CM | POA: Insufficient documentation

## 2016-12-08 DIAGNOSIS — D1803 Hemangioma of intra-abdominal structures: Secondary | ICD-10-CM

## 2016-12-08 DIAGNOSIS — R05 Cough: Secondary | ICD-10-CM

## 2016-12-08 DIAGNOSIS — Z202 Contact with and (suspected) exposure to infections with a predominantly sexual mode of transmission: Secondary | ICD-10-CM

## 2016-12-08 DIAGNOSIS — R058 Other specified cough: Secondary | ICD-10-CM

## 2016-12-08 LAB — POCT URINALYSIS DIP (DEVICE)
Bilirubin Urine: NEGATIVE
Glucose, UA: NEGATIVE mg/dL
Leukocytes, UA: NEGATIVE
Nitrite: NEGATIVE
Protein, ur: NEGATIVE mg/dL
Specific Gravity, Urine: 1.025 (ref 1.005–1.030)
Urobilinogen, UA: 1 mg/dL (ref 0.0–1.0)
pH: 6.5 (ref 5.0–8.0)

## 2016-12-08 MED ORDER — BUPROPION HCL ER (XL) 150 MG PO TB24: 150.0000 mg | ORAL_TABLET | Freq: Every day | ORAL | 5 refills | Status: DC

## 2016-12-08 MED ORDER — GUAIFENESIN ER 600 MG PO TB12: 1200.0000 mg | ORAL_TABLET | Freq: Two times a day (BID) | ORAL | 0 refills | Status: DC | PRN

## 2016-12-08 NOTE — Patient Instructions (Signed)
Morbid obesity: Will continue Wellbutrin 150 mg daily Recommend a lowfat, low carbohydrate diet divided over 5-6 small meals, increase water intake to 6-8 glasses, and 150 minutes per week of cardiovascular exercise.   Gynecological exam:  Will follow up by phone with pap smear results Recommend barrier protection with sexual intercourse  Will order an abdominal ultrasound to follow up on liver hemangioma. Will call with appointment time    Pap Test Why am I having this test? A pap test is sometimes called a pap smear. It is a screening test that is used to check for signs of cancer of the vagina, cervix, and uterus. The test can also identify the presence of infection or precancerous changes. Your health care provider will likely recommend you have this test done on a regular basis. This test may be done:  Every 3 years, starting at age 18.  Every 5 years, in combination with testing for the presence of human papillomavirus (HPV).  More or less often depending on other medical conditions.  What kind of sample is taken? Using a small cotton swab, plastic spatula, or brush, your health care provider will collect a sample of cells from the surface of your cervix. Your cervix is the opening to your uterus, also called a womb. Secretions from the cervix and vagina may also be collected. How do I prepare for this test?  Be aware of where you are in your menstrual cycle. You may be asked to reschedule the test if you are menstruating on the day of the test.  You may need to reschedule if you have a known vaginal infection on the day of the test.  You may be asked to avoid douching or taking a bath the day before or the day of the test.  Some medicines can cause abnormal test results, such as digitalis and tetracycline. Talk with your health care provider before your test if you take one of these medicines. What do the results mean? Abnormal test results may indicate a number of health  conditions. These may include:  Cancer. Although pap test results cannot be used to diagnose cancer of the cervix, vagina, or uterus, they may suggest the possibility of cancer. Further tests would be required to determine if cancer is present.  Sexually transmitted disease.  Fungal infection.  Parasite infection.  Herpes infection.  A condition causing or contributing to infertility.  It is your responsibility to obtain your test results. Ask the lab or department performing the test when and how you will get your results. Contact your health care provider to discuss any questions you have about your results. Talk with your health care provider to discuss your results, treatment options, and if necessary, the need for more tests. Talk with your health care provider if you have any questions about your results. This information is not intended to replace advice given to you by your health care provider. Make sure you discuss any questions you have with your health care provider. Document Released: 08/26/2002 Document Revised: 02/09/2016 Document Reviewed: 10/27/2013 Elsevier Interactive Patient Education  2018 [employer]. Bupropion extended-release tablets (Depression/Mood Disorders) What is this medicine? BUPROPION (byoo PROE pee on) is used to treat depression. This medicine may be used for other purposes; ask your health care provider or pharmacist if you have questions. COMMON BRAND NAME(S): Aplenzin, Budeprion XL, Forfivo XL, Wellbutrin XL What should I tell my health care provider before I take this medicine? They need to know if you have any  of these conditions: -an eating disorder, such as anorexia or bulimia -bipolar disorder or psychosis -diabetes or high blood sugar, treated with medication -glaucoma -head injury or brain tumor -heart disease, previous heart attack, or irregular heart beat -high blood pressure -kidney or liver disease -seizures (convulsions) -suicidal  thoughts or a previous suicide attempt -Tourette's syndrome -weight loss -an unusual or allergic reaction to bupropion, other medicines, foods, dyes, or preservatives -breast-feeding -pregnant or trying to become pregnant How should I use this medicine? Take this medicine by mouth with a glass of water. Follow the directions on the prescription label. You can take it with or without food. If it upsets your stomach, take it with food. Do not crush, chew, or cut these tablets. This medicine is taken once daily at the same time each day. Do not take your medicine more often than directed. Do not stop taking this medicine suddenly except upon the advice of your doctor. Stopping this medicine too quickly may cause serious side effects or your condition may worsen. A special MedGuide will be given to you by the pharmacist with each prescription and refill. Be sure to read this information carefully each time. Talk to your pediatrician regarding the use of this medicine in children. Special care may be needed. Overdosage: If you think you have taken too much of this medicine contact a poison control center or emergency room at once. NOTE: This medicine is only for you. Do not share this medicine with others. What if I miss a dose? If you miss a dose, skip the missed dose and take your next tablet at the regular time. Do not take double or extra doses. What may interact with this medicine? Do not take this medicine with any of the following medications: -linezolid -MAOIs like Azilect, Carbex, Eldepryl, Marplan, Nardil, and Parnate -methylene blue (injected into a vein) -other medicines that contain bupropion like Zyban This medicine may also interact with the following medications: -alcohol -certain medicines for anxiety or sleep -certain medicines for blood pressure like metoprolol, propranolol -certain medicines for depression or psychotic disturbances -certain medicines for HIV or AIDS like  efavirenz, lopinavir, nelfinavir, ritonavir -certain medicines for irregular heart beat like propafenone, flecainide -certain medicines for Parkinson's disease like amantadine, levodopa -certain medicines for seizures like carbamazepine, phenytoin, phenobarbital -cimetidine -clopidogrel -cyclophosphamide -digoxin -furazolidone -isoniazid -nicotine -orphenadrine -procarbazine -steroid medicines like prednisone or cortisone -stimulant medicines for attention disorders, weight loss, or to stay awake -tamoxifen -theophylline -thiotepa -ticlopidine -tramadol -warfarin This list may not describe all possible interactions. Give your health care provider a list of all the medicines, herbs, non-prescription drugs, or dietary supplements you use. Also tell them if you smoke, drink alcohol, or use illegal drugs. Some items may interact with your medicine. What should I watch for while using this medicine? Tell your doctor if your symptoms do not get better or if they get worse. Visit your doctor or health care professional for regular checks on your progress. Because it may take several weeks to see the full effects of this medicine, it is important to continue your treatment as prescribed by your doctor. Patients and their families should watch out for new or worsening thoughts of suicide or depression. Also watch out for sudden changes in feelings such as feeling anxious, agitated, panicky, irritable, hostile, aggressive, impulsive, severely restless, overly excited and hyperactive, or not being able to sleep. If this happens, especially at the beginning of treatment or after a change in dose, call your health care  professional. Avoid alcoholic drinks while taking this medicine. Drinking large amounts of alcoholic beverages, using sleeping or anxiety medicines, or quickly stopping the use of these agents while taking this medicine may increase your risk for a seizure. Do not drive or use heavy  machinery until you know how this medicine affects you. This medicine can impair your ability to perform these tasks. Do not take this medicine close to bedtime. It may prevent you from sleeping. Your mouth may get dry. Chewing sugarless gum or sucking hard candy, and drinking plenty of water may help. Contact your doctor if the problem does not go away or is severe. The tablet shell for some brands of this medicine does not dissolve. This is normal. The tablet shell may appear whole in the stool. This is not a cause for concern. What side effects may I notice from receiving this medicine? Side effects that you should report to your doctor or health care professional as soon as possible: -allergic reactions like skin rash, itching or hives, swelling of the face, lips, or tongue -breathing problems -changes in vision -confusion -elevated mood, decreased need for sleep, racing thoughts, impulsive behavior -fast or irregular heartbeat -hallucinations, loss of contact with reality -increased blood pressure -redness, blistering, peeling or loosening of the skin, including inside the mouth -seizures -suicidal thoughts or other mood changes -unusually weak or tired -vomiting Side effects that usually do not require medical attention (report to your doctor or health care professional if they continue or are bothersome): -constipation -headache -loss of appetite -nausea -tremors -weight loss This list may not describe all possible side effects. Call your doctor for medical advice about side effects. You may report side effects to FDA at 1-800-FDA-1088. Where should I keep my medicine? Keep out of the reach of children. Store at room temperature between 15 and 30 degrees C (59 and 86 degrees F). Throw away any unused medicine after the expiration date. NOTE: This sheet is a summary. It may not cover all possible information. If you have questions about this medicine, talk to your doctor,  pharmacist, or health care provider.  2018 Elsevier/Gold Standard (2015-11-26 13:55:13)

## 2016-12-08 NOTE — Progress Notes (Signed)
Kathryn Stevenson, a 41 year old female with a history of morbid obesity, GERD, and a liver lesion presents for a routine gynecological exam. She has not had a pap smear in greater than 5 years. She is not sexually active. She denies abdominal pain, vaginal itching, burning, or dryness. She does not perform monthly self-breast exams. Kathryn Stevenson last mammogram was on 10/19/2015, which was negative. She denies abnormal vaginal discharge or abnormal uterine bleeding.   Past Medical History:  Diagnosis Date  . Acid reflux   . Dermatitis   . Fracture, ankle   . Morbid obesity ([place])    Social History   Social History  . Marital status: Single    Spouse name: N/A  . Number of children: N/A  . Years of education: N/A   Occupational History  . Not on file.   Social History Main Topics  . Smoking status: Current Every Day Smoker    Packs/day: 0.50    Types: Cigarettes  . Smokeless tobacco: Never Used  . Alcohol use No  . Drug use: No  . Sexual activity: Yes    Birth control/ protection: Surgical   Other Topics Concern  . Not on file   Social History Narrative  . No narrative on file   No Known Allergies Immunization History  Administered Date(s) Administered  . Pneumococcal Polysaccharide-23 09/20/2015  . Tdap 09/20/2015   Review of Systems  Constitutional: Positive for malaise/fatigue.       Weight gain  HENT: Negative.   Eyes: Negative.   Respiratory: Positive for cough.   Cardiovascular: Negative.  Negative for chest pain.  Gastrointestinal: Negative.  Negative for abdominal pain, constipation and heartburn.  Genitourinary: Negative.  Negative for dysuria, hematuria and urgency.  Musculoskeletal: Negative.   Neurological: Negative.   Psychiatric/Behavioral: Positive for depression. The patient is nervous/anxious.    Physical Exam  Constitutional: She is oriented to person, place, and time and well-developed, well-nourished, and in no distress.  HENT:  Head:  Normocephalic and atraumatic.  Right Ear: External ear normal.  Left Ear: External ear normal.  Nose: Nose normal.  Mouth/Throat: Oropharynx is clear and moist.  Eyes: Conjunctivae and EOM are normal. Pupils are equal, round, and reactive to light.  Neck: Normal range of motion. Neck supple.  Abdominal: Soft. Bowel sounds are normal.  Genitourinary: Right adnexa normal, left adnexa normal and vulva normal. Uterus is enlarged. Vagina exhibits no exudate. Thick  creamy and vaginal discharge found.  Neurological: She is alert and oriented to person, place, and time. Gait normal. GCS score is 15.  Skin: Skin is warm and dry.  Psychiatric: Mood, memory, affect and judgment normal.  Plan   BP 112/70 (BP Location: Right Arm, Patient Position: Sitting, Cuff Size: Large)   Pulse 62   Temp 98.7 F (37.1 C) (Oral)   Resp 18   Ht 5\' 6"  (1.676 m)   Wt 289 lb (131.1 kg)   LMP 11/17/2016   SpO2 100%   BMI 46.65 kg/m    1. Pap smear, as part of routine gynecological examination  - Cytology - PAP [place]  2. Severe obesity (BMI >= 40) ([facility]) Recommend a lowfat, low carbohydrate diet divided over 5-6 small meals, increase water intake to 6-8 glasses, and 150 minutes per week of cardiovascular exercise.  Wellbutrin SR (bupropion) is prescribed as 150 mg daily. Start  at one pill daily in the morning;  to avoid insomnia. Call if any persisting side effects. - buPROPion [facility]  XL) 150 MG 24 hr tablet; Take 1 tablet (150 mg total) by mouth daily.  Dispense: 30 tablet; Refill: 5  3. Possible exposure to STD - HIV antibody (with reflex) - [facility]  4. Cough productive of clear sputum - guaiFENesin (MUCINEX) 600 MG 12 hr tablet; Take 2 tablets (1,200 mg total) by mouth 2 (two) times daily as needed for cough or to loosen phlegm.  Dispense: 30 tablet; Refill: 0  5. Liver Hemangioma  In the right hepatic lobe there is a hyperechoic focus measuring 1.8 x 1.9 x 1.8 cm most compatible with a  hemangioma. Patient will need a follow up abdominal ultrasound.    RTC: 3 months for morbid obesity and depression   [place]  MSN, FNP-C [facility] [address] [place],  [address] [phone]

## 2016-12-09 LAB — RPR

## 2016-12-09 LAB — HIV ANTIBODY (ROUTINE TESTING W REFLEX): HIV 1&2 Ab, 4th Generation: NONREACTIVE

## 2016-12-12 ENCOUNTER — Telehealth: Payer: Self-pay | Admitting: *Deleted

## 2016-12-12 DIAGNOSIS — R935 Abnormal findings on diagnostic imaging of other abdominal regions, including retroperitoneum: Secondary | ICD-10-CM

## 2016-12-12 LAB — CYTOLOGY - PAP
Bacterial vaginitis: POSITIVE — AB
Candida vaginitis: NEGATIVE
Chlamydia: NEGATIVE
Diagnosis: NEGATIVE
Neisseria Gonorrhea: NEGATIVE
Trichomonas: NEGATIVE

## 2016-12-12 NOTE — Telephone Encounter (Signed)
Patient has been scheduled for a follow up abdominal ultrasound for 12/22/16 @ 8:30 am Kathryn Stevenson Radiology NPO midnight. Patient has been advised of this appointment time/date/location and prep. She verbalizes understanding.

## 2016-12-12 NOTE — Telephone Encounter (Signed)
-----   Message from Larina Bras, [place] sent at 06/30/2016  4:07 PM EST ----- Kathryn Stevenson requested abdominal ultrasound to follow up on liver lesion for around 12/2016. See 06/30/16 office note.

## 2016-12-13 ENCOUNTER — Telehealth: Payer: Self-pay

## 2016-12-13 ENCOUNTER — Other Ambulatory Visit: Payer: Self-pay | Admitting: Family Medicine

## 2016-12-13 DIAGNOSIS — B9689 Other specified bacterial agents as the cause of diseases classified elsewhere: Secondary | ICD-10-CM

## 2016-12-13 DIAGNOSIS — N760 Acute vaginitis: Principal | ICD-10-CM

## 2016-12-13 LAB — CERVICOVAGINAL ANCILLARY ONLY: Herpes: NEGATIVE

## 2016-12-13 MED ORDER — METRONIDAZOLE 500 MG PO TABS: 500.0000 mg | ORAL_TABLET | Freq: Two times a day (BID) | ORAL | 0 refills | Status: DC

## 2016-12-13 NOTE — Telephone Encounter (Signed)
-----   Message from Dorena Dew, [place] sent at 12/13/2016  1:56 PM EDT ----- Regarding: lab results Please inform Kathryn Stevenson that pap smear was negative, will repeat in 3 years per current guidelines. Sample yielded Bacterial vaginitis.  Will start Metronidazole 500 mg BID for 7 days. Refrain from alcohol while taking metronidazole.  Remind her that abdominal ultrasound is on 12/22/2016.   Thanks.  ----- Message ----- From: Interface, Lab In [facility] Sent: 12/09/2016   6:21 AM To: Dorena Dew, FNP

## 2016-12-13 NOTE — Telephone Encounter (Signed)
Called and spoke with patient. Advised of negative pap smear and the need to have pap repeated in 3 years per guidelines. Advised that sample was positive for bacteria vaginosis and that she would need to take metronidazole 500mg  twice daily for 7 days to correct this. Advised to avoid drinking any alcohol while taking medication.  Patient verbalized understanding. Encouraged patient to keep ultrasound appointment for 12/22/2016 and next scheduled appointment with our office.

## 2016-12-13 NOTE — Progress Notes (Signed)
Meds ordered this encounter  Medications  . metroNIDAZOLE (FLAGYL) 500 MG tablet    Sig: Take 1 tablet (500 mg total) by mouth 2 (two) times daily.    Dispense:  14 tablet    Refill:  0     [name]  MSN, FNP-C [facility] [address] [place], [place] [address] [phone]

## 2016-12-14 ENCOUNTER — Encounter (HOSPITAL_COMMUNITY): Payer: Self-pay

## 2016-12-14 ENCOUNTER — Ambulatory Visit (HOSPITAL_COMMUNITY)
Admission: RE | Admit: 2016-12-14 | Discharge: 2016-12-14 | Disposition: A | Discharge status: Home / Self Care | Payer: Self-pay | Source: Ambulatory Visit | Attending: Obstetrics and Gynecology | Admitting: Obstetrics and Gynecology

## 2016-12-14 ENCOUNTER — Ambulatory Visit
Admission: RE | Admit: 2016-12-14 | Discharge: 2016-12-14 | Disposition: A | Discharge status: Home / Self Care | Payer: Self-pay | Source: Ambulatory Visit | Attending: Obstetrics and Gynecology | Admitting: Obstetrics and Gynecology

## 2016-12-14 VITALS — BP 120/84 | Ht 66.0 in | Wt 282.4 lb

## 2016-12-14 DIAGNOSIS — Z1231 Encounter for screening mammogram for malignant neoplasm of breast: Secondary | ICD-10-CM

## 2016-12-14 DIAGNOSIS — Z1239 Encounter for other screening for malignant neoplasm of breast: Secondary | ICD-10-CM

## 2016-12-14 NOTE — Progress Notes (Signed)
No complaints today.   Pap Smear:  Pap smear not completed today. Last Pap smear was 12/08/2016 at the [facility] and normal. Per patient has no history of an abnormal Pap smear. Last Pap smear result is in EPIC.  Physical exam: Breasts Right breast slightly larger than left breast that per patient is normal for her. Healed boils observed in bilateral axilla. Patient stated she has a chronic problem with boils in her axilla and is being followed by her PCP for this concern. No nipple retraction bilateral breasts. No nipple discharge bilateral breasts. No lymphadenopathy. No lumps palpated bilateral breasts. No complaints of pain or tenderness on exam. Referred patient to the [facility] for a screening mammogram. Appointment scheduled for Thursday, December 14, 2016 at 1600.        Pelvic/Bimanual No Pap smear completed today since last Pap smear was 12/08/2016. Pap smear not indicated per BCCCP guidelines.   Smoking History: Patient is a current smoker. Discussed smoking cessation with patient. Referred patient to the [facility] Quitline and gave resources to free smoking cessation classes at [facility].  Patient Navigation: Patient education provided. Access to services provided for patient through [facility] program.

## 2016-12-14 NOTE — Patient Instructions (Addendum)
Explained breast self awareness with [name]. Patient did not need a Pap smear today due to last Pap smear was 12/08/2016. Let her know [facility] will cover Pap smears every 3 years unless has a history of abnormal Pap smears. Referred patient to the [facility] for a screening mammogram. Appointment scheduled for Thursday, December 14, 2016 at 1600. Let patient know the [facility] will follow up with her within the next couple weeks with results of mammogram by letter or phone. Discussed smoking cessation with patient. Referred patient to the [facility] Quitline and gave resources to free smoking cessation classes at [facility]. Kathryn Stevenson verbalized understanding.  [place]ann[place], Arvil Chaco, RN 4:27 PM

## 2016-12-15 ENCOUNTER — Encounter (HOSPITAL_COMMUNITY): Payer: Self-pay | Admitting: *Deleted

## 2016-12-21 ENCOUNTER — Ambulatory Visit: Payer: No Typology Code available for payment source | Attending: Internal Medicine

## 2016-12-22 ENCOUNTER — Ambulatory Visit (HOSPITAL_COMMUNITY)
Admission: RE | Admit: 2016-12-22 | Discharge: 2016-12-22 | Disposition: A | Discharge status: Home / Self Care | Payer: Self-pay | Source: Ambulatory Visit | Attending: Gastroenterology | Admitting: Gastroenterology

## 2016-12-22 DIAGNOSIS — R935 Abnormal findings on diagnostic imaging of other abdominal regions, including retroperitoneum: Secondary | ICD-10-CM | POA: Insufficient documentation

## 2016-12-24 ENCOUNTER — Emergency Department (HOSPITAL_COMMUNITY): Payer: No Typology Code available for payment source

## 2016-12-24 ENCOUNTER — Emergency Department (HOSPITAL_COMMUNITY)
Admission: EM | Admit: 2016-12-24 | Discharge: 2016-12-24 | Disposition: A | Discharge status: Home / Self Care | Payer: No Typology Code available for payment source | Attending: Emergency Medicine | Admitting: Emergency Medicine

## 2016-12-24 ENCOUNTER — Encounter (HOSPITAL_COMMUNITY): Payer: Self-pay | Admitting: Emergency Medicine

## 2016-12-24 DIAGNOSIS — R002 Palpitations: Secondary | ICD-10-CM | POA: Insufficient documentation

## 2016-12-24 DIAGNOSIS — R0602 Shortness of breath: Secondary | ICD-10-CM | POA: Insufficient documentation

## 2016-12-24 DIAGNOSIS — Z5321 Procedure and treatment not carried out due to patient leaving prior to being seen by health care provider: Secondary | ICD-10-CM | POA: Insufficient documentation

## 2016-12-24 LAB — CBC WITH DIFFERENTIAL/PLATELET
Basophils Absolute: 0 10*3/uL (ref 0.0–0.1)
Basophils Relative: 0 %
Eosinophils Absolute: 0.1 10*3/uL (ref 0.0–0.7)
Eosinophils Relative: 1 %
HCT: 37.2 % (ref 36.0–46.0)
Hemoglobin: 13.2 g/dL (ref 12.0–15.0)
Lymphocytes Relative: 47 %
Lymphs Abs: 4.1 10*3/uL — ABNORMAL HIGH (ref 0.7–4.0)
MCH: 30.2 pg (ref 26.0–34.0)
MCHC: 35.5 g/dL (ref 30.0–36.0)
MCV: 85.1 fL (ref 78.0–100.0)
Monocytes Absolute: 0.5 10*3/uL (ref 0.1–1.0)
Monocytes Relative: 5 %
Neutro Abs: 4.1 10*3/uL (ref 1.7–7.7)
Neutrophils Relative %: 47 %
Platelets: 322 10*3/uL (ref 150–400)
RBC: 4.37 MIL/uL (ref 3.87–5.11)
RDW: 14 % (ref 11.5–15.5)
WBC: 8.7 10*3/uL (ref 4.0–10.5)

## 2016-12-24 LAB — COMPREHENSIVE METABOLIC PANEL
ALT: 22 U/L (ref 14–54)
AST: 26 U/L (ref 15–41)
Albumin: 3.6 g/dL (ref 3.5–5.0)
Alkaline Phosphatase: 71 U/L (ref 38–126)
Anion gap: 6 (ref 5–15)
BUN: 11 mg/dL (ref 6–20)
CO2: 22 mmol/L (ref 22–32)
Calcium: 8.9 mg/dL (ref 8.9–10.3)
Chloride: 108 mmol/L (ref 101–111)
Creatinine, Ser: 0.8 mg/dL (ref 0.44–1.00)
GFR calc Af Amer: >60 mL/min (ref >60)
GFR calc non Af Amer: >60 mL/min (ref >60)
Glucose, Bld: 103 mg/dL — ABNORMAL HIGH (ref 65–99)
Potassium: 3.7 mmol/L (ref 3.5–5.1)
Sodium: 136 mmol/L (ref 135–145)
Total Bilirubin: 0.5 mg/dL (ref 0.3–1.2)
Total Protein: 6.6 g/dL (ref 6.5–8.1)

## 2016-12-24 LAB — I-STAT TROPONIN, ED: Troponin i, poc: 0 ng/mL (ref 0.00–0.08)

## 2016-12-24 NOTE — ED Triage Notes (Signed)
Patient reports intermittent palpitations with mild SOB onset last night , denies chest pain , no cough or fever.

## 2016-12-25 ENCOUNTER — Encounter: Payer: Self-pay | Admitting: Family Medicine

## 2016-12-29 ENCOUNTER — Telehealth: Payer: Self-pay

## 2017-01-01 NOTE — Telephone Encounter (Signed)
Patient states that she stop the wellbutrin on 12/18/2016 because it was making her have a rapid heart rate. Patient states that she will discuss with you at her appointment on 01/04/2017

## 2017-01-04 ENCOUNTER — Ambulatory Visit (INDEPENDENT_AMBULATORY_CARE_PROVIDER_SITE_OTHER): Payer: Self-pay | Admitting: Family Medicine

## 2017-01-04 ENCOUNTER — Telehealth: Payer: Self-pay | Admitting: Family Medicine

## 2017-01-04 DIAGNOSIS — R000 Tachycardia, unspecified: Secondary | ICD-10-CM

## 2017-01-04 MED ORDER — CLOTRIMAZOLE-BETAMETHASONE 1-0.05 % EX CREA: 1.0000 "application " | TOPICAL_CREAM | Freq: Two times a day (BID) | CUTANEOUS | 0 refills | Status: DC

## 2017-01-04 MED ORDER — BUPROPION HCL ER (SR) 150 MG PO TB12: 150.0000 mg | ORAL_TABLET | Freq: Every day | ORAL | 5 refills | Status: DC

## 2017-01-04 NOTE — Patient Instructions (Signed)
Wellbutrin SR (bupropion) is prescribed as 150 mg BID. Start for the first few days at one pill daily in the morning; if no side effects, take the second pill around 2PM to avoid insomnia. Call if any persisting side effects.

## 2017-01-04 NOTE — Progress Notes (Signed)
Kathryn Stevenson, a 41 year old female presents complaining of periodic heart palpitations. She says that she notices an increase in heart rate and heart palpitations after taking medications. She was recently started on Welbutrin XL 150 mg daily for obesity.  Patient complains of rapid heart beat.  The symptoms are of mild and brief severity. She had 2 episodes and it has not occurred since that time. Cardiac risk factors include: obesity (BMI >= 30 kg/m2) and sedentary lifestyle.  Past Medical History:  Diagnosis Date  . Acid reflux   . Dermatitis   . Fracture, ankle   . Morbid obesity ([place])    Social History   Social History  . Marital status: Single    Spouse name: N/A  . Number of children: N/A  . Years of education: N/A   Occupational History  . Not on file.   Social History Main Topics  . Smoking status: Current Every Day Smoker    Packs/day: 0.50    Types: Cigarettes  . Smokeless tobacco: Never Used  . Alcohol use No  . Drug use: No  . Sexual activity: Yes    Birth control/ protection: Surgical   Other Topics Concern  . Not on file   Social History Narrative  . No narrative on file   No Known Allergies Immunization History  Administered Date(s) Administered  . Pneumococcal Polysaccharide-23 09/20/2015  . Tdap 09/20/2015   Review of Systems  Constitutional:       Weight gain  HENT: Negative.   Eyes: Negative.   Cardiovascular: Negative.  Negative for chest pain.  Gastrointestinal: Negative.  Negative for abdominal pain, constipation and heartburn.  Genitourinary: Negative.  Negative for dysuria, hematuria and urgency.  Musculoskeletal: Negative.   Neurological: Negative.   Psychiatric/Behavioral: Positive for depression. The patient is nervous/anxious.    Physical Exam  Constitutional: She is oriented to person, place, and time and well-developed, well-nourished, and in no distress.  HENT:  Head: Normocephalic and atraumatic.  Right Ear: External ear  normal.  Left Ear: External ear normal.  Nose: Nose normal.  Mouth/Throat: Oropharynx is clear and moist.  Eyes: Pupils are equal, round, and reactive to light. Conjunctivae and EOM are normal.  Neck: Normal range of motion. Neck supple.  Abdominal: Soft. Bowel sounds are normal.  Genitourinary: Left adnexa normal.  Neurological: She is alert and oriented to person, place, and time. Gait normal. GCS score is 15.  Skin: Skin is warm and dry.  Psychiatric: Mood, memory, affect and judgment normal.  Plan   BP 114/64   Pulse 73   Wt 285 lb (129.3 kg)   LMP 12/11/2016 (Approximate)   SpO2 98%   BMI 46.00 kg/m    1. Racing heart beat Reviewed EKG, consistent with baseline; no changes noted.  - EKG 12-Lead  2. Severe obesity (BMI >= 40) (HCC) Will continue Wellbutrin at 150 mg twice daily.  Recommend a lowfat, low carbohydrate diet divided over 5-6 small meals, increase water intake to 6-8 glasses, and 150 minutes per week of cardiovascular exercise.   - buPROPion (WELLBUTRIN SR) 150 MG 12 hr tablet; Take 1 tablet (150 mg total) by mouth daily.  Dispense: 60 tablet; Refill: 5 - EKG 12-Lead - Hemoglobin A1c     RTC: 3 months for severe obesity [name]  MSN, FNP-C [place], [place] [address] [phone]    The patient was given clear instructions to go to ER or return  to medical center if symptoms do not improve, worsen or new problems develop. The patient verbalized understanding.

## 2017-01-04 NOTE — Telephone Encounter (Signed)
Would like to know if it is okay with Cammie Sickle to take Biotin 10,000 mcg qd. Lovett Sox said yes and patient was informed.

## 2017-01-04 NOTE — Progress Notes (Deleted)
   Patient ID: Kathryn Stevenson, female    DOB: 1976/03/11, 41 y.o.   MRN: [id number]  PCP: Scot Jun, FNP  Chief Complaint  Patient presents with  . Follow-up    Subjective:  HPI  Kathryn Stevenson is a 41 y.o. female presents for evaluation of     Social History   Social History  . Marital status: Single    Spouse name: N/A  . Number of children: N/A  . Years of education: N/A   Occupational History  . Not on file.   Social History Main Topics  . Smoking status: Current Every Day Smoker    Packs/day: 0.50    Types: Cigarettes  . Smokeless tobacco: Never Used  . Alcohol use No  . Drug use: No  . Sexual activity: Yes    Birth control/ protection: Surgical   Other Topics Concern  . Not on file   Social History Narrative  . No narrative on file    Family History  Problem Relation Age of Onset  . Hypertension Mother   . Ovarian cancer Maternal Grandmother   . Heart attack Maternal Grandmother   . Esophageal cancer Neg Hx   . Colon cancer Neg Hx   . Pancreatic cancer Neg Hx   . Stomach cancer Neg Hx   . Liver disease Neg Hx      Review of Systems  Patient Active Problem List   Diagnosis Date Noted  . RUQ abdominal pain 06/30/2016  . Liver lesion 06/30/2016  . Gastroesophageal reflux disease 06/30/2016  . Morbid obesity ([place]) 09/20/2015  . Severe obesity (BMI >= 40) ([place]) 09/20/2015  . Dermatitis 09/20/2015    No Known Allergies  Prior to Admission medications   Medication Sig Start Date End Date Taking? Authorizing Provider  metroNIDAZOLE (FLAGYL) 500 MG tablet Take 500 mg by mouth 2 (two) times daily.   Yes [provider]  omeprazole (PRILOSEC) 40 MG capsule Take 1 capsule (40 mg total) by mouth daily. 10/27/16  Yes Scot Jun, FNP  triamcinolone cream (KENALOG) 0.1 % Apply 1 application topically 2 (two) times daily. 09/20/15  Yes Dorena Dew, FNP    Past Medical, Surgical Family and Social History reviewed and  updated.    Objective:   Today's Vitals   01/04/17 1347  BP: 114/64  Pulse: 73  SpO2: 98%  Weight: 285 lb (129.3 kg)    Wt Readings from Last 3 Encounters:  01/04/17 285 lb (129.3 kg)  12/24/16 284 lb (128.8 kg)  12/14/16 282 lb 6.4 oz (128.1 kg)    Physical Exam         Assessment & Plan:  There are no diagnoses linked to this encounter.   Carroll Sage. Kenton Kingfisher, MSN, FNP-C [facility]  [address] [place], [place] [address] [phone]

## 2017-01-05 LAB — HEMOGLOBIN A1C
Hgb A1c MFr Bld: 5 % (ref <5.7)
Mean Plasma Glucose: 97 mg/dL

## 2017-01-07 ENCOUNTER — Encounter: Payer: Self-pay | Admitting: Family Medicine

## 2017-01-10 ENCOUNTER — Telehealth: Payer: Self-pay

## 2017-01-10 NOTE — Telephone Encounter (Signed)
Called and spoke with patient, she is stating she is still experiencing rapid heartbeat and wants a referral to cardiology. I advised patient to come back in for a follow up appointment. Patient denied sob/chest pain at this time. Appointment was scheduled for tomorrow 01/11/2017. Thanks!

## 2017-01-11 ENCOUNTER — Encounter: Payer: Self-pay | Admitting: Family Medicine

## 2017-01-11 ENCOUNTER — Ambulatory Visit (INDEPENDENT_AMBULATORY_CARE_PROVIDER_SITE_OTHER): Payer: Self-pay | Admitting: Family Medicine

## 2017-01-11 VITALS — BP 108/57 | HR 52 | Temp 98.3°F | Resp 16 | Ht 66.0 in | Wt 284.0 lb

## 2017-01-11 DIAGNOSIS — R000 Tachycardia, unspecified: Secondary | ICD-10-CM

## 2017-01-11 DIAGNOSIS — G4709 Other insomnia: Secondary | ICD-10-CM

## 2017-01-11 MED ORDER — TRAZODONE HCL 50 MG PO TABS
25.0000 mg | ORAL_TABLET | Freq: Every evening | ORAL | 0 refills | Status: DC | PRN
Start: 2017-01-11 — End: 2017-02-21

## 2017-01-11 NOTE — Patient Instructions (Signed)
Insomnia:  Will start a trial of Trazodone 25 mg at bedtime. Do not drink alcohol, drive or operate manchinery while taking this medication.  Practice good sleep hygiene.  Stick to a sleep schedule, even on weekends. Exercise is great, but not too late in the day Avoid alcoholic drinks before bed Avoid large meals and beverages late before bed Don't take naps after 3 pm. Keep power naps less than 1 hour.  Relax before bed.  Take a hot bath before bed.  Have a good sleeping environment. Get rid of anything in your bedroom that might distract you from sleep.  Adopt good sleeping posture.    Heart racing; Refrain from caffeine use (tea, soda, chocolate, etc)

## 2017-01-11 NOTE — Progress Notes (Signed)
Subjective:     RENNE Stevenson is a 41 y.o. female who complains of insomnia. Onset was several weeks ago. She says that insomnia has increased since starting Welbutrin. She says that typically "hears her heartbeat", which presents her from sleeping.  Patient describes symptoms as difficulty falling asleep. Paient has not attempted self treatment.  Patient denies daytime somnolence, depression, fatigue, frequent nighttime urination, irritability, leg cramps and restless legs. Symptoms have been intermittent   Past Medical History:  Diagnosis Date  . Acid reflux   . Dermatitis   . Fracture, ankle   . Morbid obesity ([place])    Social History   Social History  . Marital status: Single    Spouse name: N/A  . Number of children: N/A  . Years of education: N/A   Occupational History  . Not on file.   Social History Main Topics  . Smoking status: Former Smoker    Packs/day: 0.50    Types: Cigarettes  . Smokeless tobacco: Never Used  . Alcohol use No  . Drug use: No  . Sexual activity: Yes    Birth control/ protection: Surgical   Other Topics Concern  . Not on file   Social History Narrative  . No narrative on file   No Known Allergies Immunization History  Administered Date(s) Administered  . Pneumococcal Polysaccharide-23 09/20/2015  . Tdap 09/20/2015     Review of Systems Review of Systems  Constitutional: Negative.   HENT: Negative.   Respiratory: Negative.   Cardiovascular: Positive for palpitations (periodic).  Gastrointestinal: Negative.   Genitourinary: Negative.   Musculoskeletal: Negative.   Skin: Negative.   Neurological: Negative.   Endo/Heme/Allergies: Negative.   Psychiatric/Behavioral: Negative.     Objective:    BP (!) 108/57 (BP Location: Right Arm, Patient Position: Sitting, Cuff Size: Large)   Pulse (!) 52   Temp 98.3 F (36.8 C) (Oral)   Resp 16   Ht 5\' 6"  (1.676 m)   Wt 284 lb (128.8 kg)   LMP 01/09/2017   SpO2 100%   BMI 45.84 kg/m    General Appearance:    Alert, cooperative, no distress, appears stated age  Head:    Normocephalic, without obvious abnormality, atraumatic  Eyes:    PERRL, conjunctiva/corneas clear, EOM's intact, fundi    benign, both eyes  Ears:    Normal TM's and external ear canals, both ears  Nose:   Nares normal, septum midline, mucosa normal, no drainage    or sinus tenderness  Throat:   Lips, mucosa, and tongue normal; teeth and gums normal  Neck:   Supple, symmetrical, trachea midline, no adenopathy;    thyroid:  no enlargement/tenderness/nodules; no carotid   bruit or JVD  Back:     Symmetric, no curvature, ROM normal, no CVA tenderness  Lungs:     Clear to auscultation bilaterally, respirations unlabored  Chest Wall:    No tenderness or deformity   Heart:    Regular rate and rhythm, S1 and S2 normal, no murmur, rub   or gallop  Abdomen:     Soft, non-tender, bowel sounds active all four quadrants,    no masses, no organomegaly  Neurologic:   CNII-XII intact, normal strength, sensation and reflexes    throughout      Assessment:    Insomnia    Plan:  1. Other insomnia Will start a trial of trazodone 25 mg at bedtime as needed.  - traZODone (DESYREL) 50 MG tablet; Take 0.5 tablets (25  mg total) by mouth at bedtime as needed for sleep.  Dispense: 30 tablet; Refill: 0  Discussed sleep hygiene measures including regular sleep schedule, optimal sleep environment, and relaxing presleep rituals. Avoid daytime naps. Avoid caffeine after noon. Avoid excess alcohol. Avoid tobacco. Recommended daily exercise.    2. Racing heart beat Ms. Baer's heart rate is 61 on auscultation. I also reviewed EKG, unremarkable. Will continue to monitor.    RTC: As needed    [name]  MSN, FNP-C [facility] [address] [place], [place] [address] [phone]

## 2017-01-17 ENCOUNTER — Emergency Department (HOSPITAL_COMMUNITY)
Admission: EM | Admit: 2017-01-17 | Discharge: 2017-01-17 | Disposition: A | Discharge status: Home / Self Care | Payer: No Typology Code available for payment source | Attending: Emergency Medicine | Admitting: Emergency Medicine

## 2017-01-17 ENCOUNTER — Encounter (HOSPITAL_COMMUNITY): Payer: Self-pay | Admitting: Emergency Medicine

## 2017-01-17 DIAGNOSIS — L02419 Cutaneous abscess of limb, unspecified: Secondary | ICD-10-CM

## 2017-01-17 DIAGNOSIS — Z87891 Personal history of nicotine dependence: Secondary | ICD-10-CM | POA: Insufficient documentation

## 2017-01-17 DIAGNOSIS — L02412 Cutaneous abscess of left axilla: Secondary | ICD-10-CM | POA: Insufficient documentation

## 2017-01-17 MED ORDER — SULFAMETHOXAZOLE-TRIMETHOPRIM 800-160 MG PO TABS: 1.0000 | ORAL_TABLET | Freq: Two times a day (BID) | ORAL | 0 refills | Status: AC

## 2017-01-17 MED ORDER — LIDOCAINE-EPINEPHRINE (PF) 2 %-1:200000 IJ SOLN
10.0000 mL | Freq: Once | INTRAMUSCULAR | Status: AC
  Administered 2017-01-17: 10 mL
  Filled 2017-01-17: qty 20

## 2017-01-17 NOTE — Discharge Instructions (Signed)
Please read and follow all provided instructions.  Your diagnoses today include:  1. Axillary abscess     Tests performed today include:  Vital signs. See below for your results today.   Medications prescribed:   Bactrim (trimethoprim/sulfamethoxazole) - antibiotic  You have been prescribed an antibiotic medicine: take the entire course of medicine even if you are feeling better. Stopping early can cause the antibiotic not to work.  Take any prescribed medications only as directed.   Home care instructions:   Follow any educational materials contained in this packet  Follow-up instructions: Return to the Emergency Department in 48 hours for a recheck if your symptoms are not significantly improved.  Please follow-up with your primary care provider in the next 1 week for further evaluation of your symptoms.   Return instructions:  Return to the Emergency Department if you have:  Fever  Worsening symptoms  Worsening pain  Worsening swelling  Redness of the skin that moves away from the affected area, especially if it streaks away from the affected area   Any other emergent concerns  Your vital signs today were: BP 124/82 (BP Location: Right Arm)    Pulse 72    Temp 98.8 F (37.1 C) (Oral)    Resp 16    Ht 5\' 6"  (1.676 m)    Wt 128.8 kg (284 lb)    LMP 01/09/2017    SpO2 100%    BMI 45.84 kg/m  If your blood pressure (BP) was elevated above 135/85 this visit, please have this repeated by your doctor within one month. --------------

## 2017-01-17 NOTE — ED Provider Notes (Signed)
[facility] DEPT Provider Note   CSN: [id number] Arrival date & time: 01/17/17  0559     History   Chief Complaint Chief Complaint  Patient presents with  . Abscess    HPI Kathryn Stevenson is a 41 y.o. female.  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 Area has been gradually worsening. No drainage. She has had multiple incision and drainages in the past. She has never been diagnosed with hidradenitis as far she knows. No vomiting or diarrhea. No documented fevers.      Past Medical History:  Diagnosis Date  . Acid reflux   . Dermatitis   . Fracture, ankle   . Morbid obesity [facility])     Patient Active Problem List   Diagnosis Date Noted  . RUQ abdominal pain 06/30/2016  . Liver lesion 06/30/2016  . Gastroesophageal reflux disease 06/30/2016  . Morbid obesity ([place]) 09/20/2015  . Severe obesity (BMI >= 40) ([place]) 09/20/2015  . Dermatitis 09/20/2015    Past Surgical History:  Procedure Laterality Date  . ANKLE SURGERY Left   . INDUCED ABORTION    . MOUTH SURGERY    . TUBAL LIGATION      OB History    Gravida Para Term Preterm AB Living   6 2 2   4 2    SAB TAB Ectopic Multiple Live Births   1 3             Home Medications    Prior to Admission medications   Medication Sig Start Date End Date Taking? Authorizing Provider  clotrimazole-betamethasone (LOTRISONE) cream Apply 1 application topically 2 (two) times daily. 01/04/17   Dorena Dew, FNP  omeprazole (PRILOSEC) 40 MG capsule Take 1 capsule (40 mg total) by mouth daily. 10/27/16   Scot Jun, FNP  sulfamethoxazole-trimethoprim (BACTRIM DS,SEPTRA DS) 800-160 MG tablet Take 1 tablet by mouth 2 (two) times daily. 01/17/17 01/24/17  Carlisle Cater, PA-C  traZODone (DESYREL) 50 MG tablet Take 0.5 tablets (25 mg total) by mouth at bedtime as needed for sleep. 01/11/17   Dorena Dew, FNP    Family  History Family History  Problem Relation Age of Onset  . Hypertension Mother   . Ovarian cancer Maternal Grandmother   . Heart attack Maternal Grandmother   . Esophageal cancer Neg Hx   . Colon cancer Neg Hx   . Pancreatic cancer Neg Hx   . Stomach cancer Neg Hx   . Liver disease Neg Hx     Social History Social History  Substance Use Topics  . Smoking status: Former Smoker    Packs/day: 0.50    Types: Cigarettes  . Smokeless tobacco: Never Used  . Alcohol use No     Allergies   Patient has no known allergies.   Review of Systems Review of Systems  Constitutional: Negative for fever.  Gastrointestinal: Negative for nausea and vomiting.  Skin: Negative for color change.       Positive for abscess.  Hematological: Negative for adenopathy.     Physical Exam Updated Vital Signs BP 124/82 (BP Location: Right Arm)   Pulse 72   Temp 98.8 F (37.1 C) (Oral)   Resp 16   Ht 5\' 6"  (1.676 m)   Wt 128.8 kg (284 lb)   LMP 01/09/2017   SpO2 100%   BMI 45.84 kg/m   Physical Exam  Constitutional: She appears well-developed and  well-nourished.  HENT:  Head: Normocephalic and atraumatic.  Eyes: Conjunctivae are normal.  Neck: Normal range of motion. Neck supple.  Pulmonary/Chest: No respiratory distress.  Neurological: She is alert.  Skin: Skin is warm and dry.  Patient with irregular area, approximately 4 cm x 2 cm x 2 cm, of swelling and induration consistent with abscess.  Psychiatric: She has a normal mood and affect.  Nursing note and vitals reviewed.    ED Treatments / Results   Procedures Procedures (including critical care time)  Medications Ordered in ED Medications  lidocaine-EPINEPHrine (XYLOCAINE W/EPI) 2 %-1:200000 (PF) injection 10 mL (10 mLs Infiltration Given 01/17/17 8638)     Initial Impression / Assessment and Plan / ED Course  I have reviewed the triage vital signs and the nursing notes.  Pertinent labs & imaging results that were  available during my care of the patient were reviewed by me and considered in my medical decision making (see chart for details).     Patient seen and examined. Work-up initiated. Medications ordered.   Vital signs reviewed and are as follows: BP 124/82 (BP Location: Right Arm)   Pulse 72   Temp 98.8 F (37.1 C) (Oral)   Resp 16   Ht 5\' 6"  (1.676 m)   Wt 128.8 kg (284 lb)   LMP 01/09/2017   SpO2 100%   BMI 45.84 kg/m   INCISION AND DRAINAGE Performed by: Faustino Congress Consent: Verbal consent obtained. Risks and benefits: risks, benefits and alternatives were discussed Type: abscess  Body area: Left axilla  Anesthesia: local infiltration  Incision was made with a scalpel.  Local anesthetic: lidocaine 2% % with epinephrine  Anesthetic total: 3 ml  Complexity: complex Blunt dissection to break up loculations  Drainage: purulent  Drainage amount: Moderate   Packing material: 1/4 in iodoform gauze  Patient tolerance: Patient tolerated the procedure well with no immediate complications.   7:05 AM The patient was urged to return to the Emergency Department urgently with worsening pain, swelling, expanding erythema especially if it streaks away from the affected area, fever, or if they have any other concerns.   The patient was instructed to remove packing at home in 48 hours and return to the Emergency Department or go to their PCP at that time for a recheck if their symptoms are not improved.  The patient verbalized understanding and stated agreement with this plan.    Final Clinical Impressions(s) / ED Diagnoses   Final diagnoses:  Axillary abscess   Patient with left axillary abscess, likely associated hidradenitis suppurativa. Home with Bactrim, general surgery referral.  New Prescriptions New Prescriptions   SULFAMETHOXAZOLE-TRIMETHOPRIM (BACTRIM DS,SEPTRA DS) 800-160 MG TABLET    Take 1 tablet by mouth 2 (two) times daily.     Carlisle Cater,  PA-C 01/17/17 1771    Merryl Hacker, MD 01/18/17 [phone]

## 2017-01-17 NOTE — ED Triage Notes (Signed)
Pt presents with abscess to L axilla x 3 days; denies drainage; pt reports subjective fevers

## 2017-01-25 ENCOUNTER — Ambulatory Visit: Payer: No Typology Code available for payment source | Admitting: Family Medicine

## 2017-02-21 ENCOUNTER — Other Ambulatory Visit: Payer: Self-pay | Admitting: Family Medicine

## 2017-02-21 DIAGNOSIS — G4709 Other insomnia: Secondary | ICD-10-CM

## 2017-02-22 ENCOUNTER — Other Ambulatory Visit: Payer: Self-pay

## 2017-02-22 MED ORDER — TRAZODONE HCL 50 MG PO TABS: 25.0000 mg | ORAL_TABLET | Freq: Every evening | ORAL | 0 refills | Status: DC | PRN

## 2017-03-06 ENCOUNTER — Encounter (HOSPITAL_COMMUNITY): Payer: Self-pay | Admitting: Emergency Medicine

## 2017-03-06 ENCOUNTER — Ambulatory Visit (HOSPITAL_COMMUNITY)
Admission: EM | Admit: 2017-03-06 | Discharge: 2017-03-06 | Disposition: A | Discharge status: Home / Self Care | Payer: No Typology Code available for payment source | Attending: Family Medicine | Admitting: Family Medicine

## 2017-03-06 DIAGNOSIS — J029 Acute pharyngitis, unspecified: Secondary | ICD-10-CM

## 2017-03-06 DIAGNOSIS — L03112 Cellulitis of left axilla: Secondary | ICD-10-CM

## 2017-03-06 MED ORDER — DOXYCYCLINE HYCLATE 100 MG PO TABS: 100.0000 mg | ORAL_TABLET | Freq: Two times a day (BID) | ORAL | 1 refills | Status: DC

## 2017-03-06 NOTE — Discharge Instructions (Signed)
Warm compresses several times a day to left arm pit area

## 2017-03-06 NOTE — ED Triage Notes (Signed)
PT reports abscess under left arm. PT reports fever and sore throat for 3-4 days.

## 2017-03-06 NOTE — ED Provider Notes (Signed)
  [facility]   [id number] 03/06/17 Arrival Time: 1004   SUBJECTIVE:  Kathryn Stevenson is a 41 y.o. female who presents to the urgent care with complaint of abscess under left arm. PT reports fever and sore throat for 3-4 days.   Patient works two jobs:  Dispensing optician    Past Medical History:  Diagnosis Date  . Acid reflux   . Dermatitis   . Fracture, ankle   . Morbid obesity ([place])    Family History  Problem Relation Age of Onset  . Hypertension Mother   . Ovarian cancer Maternal Grandmother   . Heart attack Maternal Grandmother   . Esophageal cancer Neg Hx   . Colon cancer Neg Hx   . Pancreatic cancer Neg Hx   . Stomach cancer Neg Hx   . Liver disease Neg Hx    Social History   Social History  . Marital status: Single    Spouse name: N/A  . Number of children: N/A  . Years of education: N/A   Occupational History  . Not on file.   Social History Main Topics  . Smoking status: Former Smoker    Packs/day: 0.50    Types: Cigarettes  . Smokeless tobacco: Never Used  . Alcohol use No  . Drug use: No  . Sexual activity: Yes    Birth control/ protection: Surgical   Other Topics Concern  . Not on file   Social History Narrative  . No narrative on file   No outpatient prescriptions have been marked as taking for the 03/06/17 encounter [facility] Encounter).   No Known Allergies    ROS: As per HPI, remainder of ROS negative.   OBJECTIVE:   Vitals:   03/06/17 1038 03/06/17 1039  BP:  112/69  Pulse:  70  Resp:  16  Temp:  99 F (37.2 C)  TempSrc:  Oral  SpO2:  100%  Weight: 280 lb (127 kg)   Height: 5\' 6"  (1.676 m)      General appearance: alert; no distress Eyes: PERRL; EOMI; conjunctiva normal HENT: normocephalic; atraumatic; TMs normal, canal normal, external ears normal without trauma; nasal mucosa normal; oral mucosa normal Neck: supple Lungs: clear to auscultation bilaterally Heart: regular rate and rhythm Back: no CVA  tenderness Extremities: no cyanosis or edema; symmetrical with no gross deformities; tender indurated left axilla Skin: warm and dry Neurologic: normal gait; grossly normal Psychological: alert and cooperative; normal mood and affect      Labs:  Results for orders placed or performed in visit on 01/04/17  Hemoglobin A1c  Result Value Ref Range   Hgb A1c MFr Bld 5.0 <5.7 %   Mean Plasma Glucose 97 mg/dL    Labs Reviewed - No data to display  No results found.     ASSESSMENT & PLAN:  1. Cellulitis of axilla, left   2. Acute pharyngitis, unspecified etiology     Meds ordered this encounter  Medications  . doxycycline (VIBRA-TABS) 100 MG tablet    Sig: Take 1 tablet (100 mg total) by mouth 2 (two) times daily.    Dispense:  20 tablet    Refill:  1    Reviewed expectations re: course of current medical issues. Questions answered. Outlined signs and symptoms indicating need for more acute intervention. Patient verbalized understanding. After Visit Summary given.    Procedures:      Robyn Haber, MD 03/06/17 [phone]

## 2017-03-12 ENCOUNTER — Encounter: Payer: Self-pay | Admitting: Family Medicine

## 2017-03-12 NOTE — Progress Notes (Signed)
This encounter was created in error - please disregard.  This encounter was created in error - please disregard.

## 2017-03-27 ENCOUNTER — Ambulatory Visit: Payer: Self-pay | Attending: Internal Medicine

## 2017-03-29 ENCOUNTER — Ambulatory Visit (INDEPENDENT_AMBULATORY_CARE_PROVIDER_SITE_OTHER): Payer: Self-pay | Admitting: Family Medicine

## 2017-03-29 ENCOUNTER — Encounter: Payer: Self-pay | Admitting: Family Medicine

## 2017-03-29 ENCOUNTER — Ambulatory Visit: Payer: Self-pay | Admitting: Family Medicine

## 2017-03-29 VITALS — BP 101/48 | HR 68 | Temp 98.4°F | Resp 16 | Ht 66.0 in | Wt 299.0 lb

## 2017-03-29 DIAGNOSIS — H1013 Acute atopic conjunctivitis, bilateral: Secondary | ICD-10-CM

## 2017-03-29 DIAGNOSIS — W19XXXA Unspecified fall, initial encounter: Secondary | ICD-10-CM

## 2017-03-29 DIAGNOSIS — M79604 Pain in right leg: Secondary | ICD-10-CM

## 2017-03-29 MED ORDER — IBUPROFEN 600 MG PO TABS: 600.0000 mg | ORAL_TABLET | Freq: Three times a day (TID) | ORAL | 0 refills | Status: DC | PRN

## 2017-03-29 MED ORDER — OLOPATADINE HCL 0.2 % OP SOLN: 1.0000 [drp] | Freq: Every day | OPHTHALMIC | 0 refills | Status: AC

## 2017-03-29 MED ORDER — KETOROLAC TROMETHAMINE 60 MG/2ML IM SOLN
60.0000 mg | Freq: Once | INTRAMUSCULAR | Status: AC
  Administered 2017-03-29: 60 mg via INTRAMUSCULAR

## 2017-03-29 NOTE — Progress Notes (Signed)
Subjective:    Kathryn Stevenson is a 41 y.o. female who presents with right leg pain. Patient sustained a fall at work. She slipped out of a "tug motor" on to a concrete floor. The incident occurred around 12 hours ago. She says that she feels soreness to right leg. Current pain intensity is 7/10 and described as aching.  Aggravating symptoms include: lateral movements and rising after sitting. Patient has had no prior leg problems.  Patient is complaining of bilateral eye discomfort and redness. She describes eyes as itching. Symptoms have been present for greater than 1 week. She has not attempted any OTC interventions to alleviate current symptoms.   Review of Systems  Constitutional: Negative.  Negative for diaphoresis.  HENT: Negative.   Eyes: Negative.   Respiratory: Negative.   Cardiovascular: Negative for leg swelling.  Gastrointestinal: Negative.   Genitourinary: Negative.   Musculoskeletal: Positive for myalgias (right leg).  Skin: Negative.   Neurological: Negative.   Psychiatric/Behavioral: Negative.      Objective:    BP (!) 101/48 (BP Location: Right Arm, Patient Position: Sitting, Cuff Size: Large)   Pulse 68   Temp 98.4 F (36.9 C) (Oral)   Resp 16   Ht 5\' 6"  (1.676 m)   Wt 299 lb (135.6 kg)   LMP 03/26/2017   SpO2 100%   BMI 48.26 kg/m  BP (!) 101/48 (BP Location: Right Arm, Patient Position: Sitting, Cuff Size: Large)   Pulse 68   Temp 98.4 F (36.9 C) (Oral)   Resp 16   Ht 5\' 6"  (1.676 m)   Wt 299 lb (135.6 kg)   LMP 03/26/2017   SpO2 100%   BMI 48.26 kg/m   General Appearance:    Alert, cooperative, no distress, appears stated age  Head:    Normocephalic, without obvious abnormality, atraumatic  Eyes:    PERRL, conjunctiva red, increased tearing bilaterally, EOM's intact, fundi    benign, both eyes  Ears:    Normal TM's and external ear canals, both ears  Nose:   Nares normal, septum midline, mucosa normal, no drainage    or sinus tenderness   Throat:   Lips, mucosa, and tongue normal; teeth and gums normal  Neck:   Supple, symmetrical, trachea midline, no adenopathy;    thyroid:  no enlargement/tenderness/nodules; no carotid   bruit or JVD  Back:     Symmetric, no curvature, ROM normal, no CVA tenderness  Lungs:     Clear to auscultation bilaterally, respirations unlabored   Heart:    Regular rate and rhythm, S1 and S2 normal, no murmur, rub   or gallop  Abdomen:     Soft, non-tender, bowel sounds active all four quadrants,    no masses, no organomegaly  Extremities:   Extremities normal, atraumatic, right lateral leg and thigh tender to palpation.   Pulses:   2+ and symmetric all extremities  Skin:   Skin color, texture, turgor normal, no rashes or lesions  Lymph nodes:   Cervical, supraclavicular, and axillary nodes normal  Neurologic:   CNII-XII intact, normal strength, sensation and reflexes    throughout   Assessment:    Right leg strain   Plan:  1. Fall, initial encounter Patient sustained a fall at work on last night.  She has not attempted any over the counter interventions to alleviate symptoms I suspect a strain to right leg. Mild tenderness to palpation. Patient is able to apply full weight without issue.  Natural history and expected  course discussed. Questions answered. NSAIDs per medication orders. 2. Right leg pain  - ketorolac (TORADOL) injection 60 mg; Inject 2 mLs (60 mg total) into the muscle once. - ibuprofen (ADVIL,MOTRIN) 600 MG tablet; Take 1 tablet (600 mg total) by mouth every 8 (eight) hours as needed.  Dispense: 30 tablet; Refill: 0  3. Allergic conjunctivitis of both eyes - Olopatadine HCl 0.2 % SOLN; Apply 1 drop to eye daily.  Dispense: 1 Bottle; Refill: 0  Kathryn Pounds  MSN, FNP-C Patient Kathryn Stevenson [address] [place], [place] [address] [phone]

## 2017-03-29 NOTE — Patient Instructions (Addendum)
Toradol injection received for right leg pain Ibuprofen 600 mg every 8 hours as needed for mild to moderate discomfort. Take with food   Allergic conjunctivitis: Pataday drops to each eye daily.    Olopatadine eye solution What is this medicine? OLOPATADINE (oh loe pa TA deen) drops are used in the eye to treat symptoms caused by allergies. This medicine may be used for other purposes; ask your health care provider or pharmacist if you have questions. COMMON BRAND NAME(S): Pataday, Patanol, Pazeo What should I tell my health care provider before I take this medicine? They need to know if you have any of these conditions: -wear contact lenses -an unusual or allergic reaction to olopatadine, other medicines, foods, dyes, or preservatives -pregnant or trying to get pregnant -breast-feeding How should I use this medicine? This medicine is only for use in the eye. Do not take by mouth. Follow the directions on the prescription label. Wash hands before and after use. Tilt the head back slightly and pull down the lower lid with the index finger to form a pouch. Try not to touch the tip of the dropper to your eye, fingertips, or any other surface. Place the prescribed number of drops into the pouch. Close the eye gently to spread the drops. Your vision may blur for a few minutes. Use your doses at regular intervals. Do not use your medicine more often than directed. Talk to your pediatrician regarding the use of this medicine in children. While this drug may be prescribed for children as young as 13 years of age for selected conditions, precautions do apply. Overdosage: If you think you have taken too much of this medicine contact a poison control center or emergency room at once. NOTE: This medicine is only for you. Do not share this medicine with others. What if I miss a dose? If you miss a dose, use it as soon as you can. If it is almost time for your next dose, use only that dose. Do not use double  or extra doses. What may interact with this medicine? Interactions are not expected. Do not use any other eye products without telling your doctor or health care professional. This list may not describe all possible interactions. Give your health care provider a list of all the medicines, herbs, non-prescription drugs, or dietary supplements you use. Also tell them if you smoke, drink alcohol, or use illegal drugs. Some items may interact with your medicine. What should I watch for while using this medicine? Tell your doctor or healthcare professional if your symptoms do not start to get better or if they get worse. Stop using this medicine if your eyes get swollen, painful, or have a discharge, and see your doctor or health care professional as soon as you can. Do not wear contact lenses if your eyes are red. This medicine should not be used to treat irritation that is caused by contact lenses. Remove soft contact lenses before using this medicine. Contact lenses may be inserted 10 minutes after putting the medicine in the eye. What side effects may I notice from receiving this medicine? Side effects that you should report to your doctor or health care professional as soon as possible: -allergic reactions like skin rash, itching or hives, swelling of the face, lips, or tongue -eye pain, swelling, or increased redness Side effects that usually do not require medical attention (report to your doctor or health care professional if they continue or are bothersome): -burning, discomfort, stinging, or tearing  immediately after use -dry eyes -headache -runny or stuffy nose This list may not describe all possible side effects. Call your doctor for medical advice about side effects. You may report side effects to FDA at 1-800-FDA-1088. Where should I keep my medicine? Keep out of reach of children. Store between 4 and 25 degrees C (39 and 77 degrees F). Keep container tightly closed when not in use.  Protect from light. Throw away any unused medicine after the expiration date. NOTE: This sheet is a summary. It may not cover all possible information. If you have questions about this medicine, talk to your doctor, pharmacist, or health care provider.  2018 Elsevier/Gold Standard (2016-02-02 15:45:49)  Allergic Conjunctivitis A clear membrane (conjunctiva) covers the white part of your eye and the inner surface of your eyelid. Allergic conjunctivitis happens when this membrane has inflammation. This is caused by allergies. Common causes of allergic reactions (allergens)include:  Outdoor allergens, such as: ? Pollen. ? Grass and weeds. ? Mold spores.  Indoor allergens, such as: ? Dust. ? Smoke. ? Mold. ? Pet dander. ? Animal hair.  This condition can make your eye red or pink. It can also make your eye feel itchy. This condition cannot be spread from one person to another person (is not contagious). Follow these instructions at home:  Try not to be around things that you are allergic to.  Take or apply over-the-counter and prescription medicines only as told by your doctor. These include any eye drops.  Place a cool, clean washcloth on your eye for 10-20 minutes. Do this 3-4 times a day.  Do not touch or rub your eyes.  Do not wear contact lenses until the inflammation is gone. Wear glasses instead.  Do not wear eye makeup until the inflammation is gone.  Keep all follow-up visits as told by your doctor. This is important. Contact a doctor if:  Your symptoms get worse.  Your symptoms do not get better with treatment.  You have mild eye pain.  You are sensitive to light,  You have spots or blisters on your eyes.  You have pus coming from your eye.  You have a fever. Get help right away if:  You have redness, swelling, or other symptoms in only one eye.  Your vision is blurry.  You have vision changes.  You have very bad eye pain. Summary  Allergic  conjunctivitis is caused by allergies. It can make your eye red or pink, and it can make your eye feel itchy.  This condition cannot be spread from one person to another person (is not contagious).  Try not to be around things that you are allergic to.  Take or apply over-the-counter and prescription medicines only as told by your doctor. These include any eye drops.  Contact your doctor if your symptoms get worse or they do not get better with treatment. This information is not intended to replace advice given to you by your health care provider. Make sure you discuss any questions you have with your health care provider. Document Released: 11/23/2009 Document Revised: 01/28/2016 Document Reviewed: 01/28/2016 Elsevier Interactive Patient Education  2017 [employer] for Routine Care of Injuries Many injuries can be cared for using rest, ice, compression, and elevation (RICE therapy). Using RICE therapy can help to lessen pain and swelling. It can help your body to heal. Rest Reduce your normal activities and avoid using the injured part of your body. You can go back to your normal activities  when you feel okay and your doctor says it is okay. Ice Do not put ice on your bare skin.  Put ice in a plastic bag.  Place a towel between your skin and the bag.  Leave the ice on for 20 minutes, 2-3 times a day.  Do this for as long as told by your doctor. Elevation Elevation means keeping the injured area raised. Raise the injured area above your heart or the center of your chest if you can. When should I get help? You should get help if:  You keep having pain and swelling.  Your symptoms get worse.  Get help right away if: You should get help right away if:  You have sudden bad pain at or below the area of your injury.  You have redness or more swelling around your injury.  You have tingling or numbness at or below the injury that does not go away when you take off the  bandage.  This information is not intended to replace advice given to you by your health care provider. Make sure you discuss any questions you have with your health care provider. Document Released: 11/22/2007 Document Revised: 05/02/2016 Document Reviewed: 05/13/2014 Elsevier Interactive Patient Education  2017 [employer].

## 2017-04-04 ENCOUNTER — Encounter: Payer: Self-pay | Admitting: Family Medicine

## 2017-04-13 ENCOUNTER — Other Ambulatory Visit: Payer: Self-pay | Admitting: Family Medicine

## 2017-04-13 ENCOUNTER — Encounter: Payer: Self-pay | Admitting: Family Medicine

## 2017-04-23 ENCOUNTER — Other Ambulatory Visit: Payer: Self-pay | Admitting: Family Medicine

## 2017-04-23 ENCOUNTER — Encounter: Payer: Self-pay | Admitting: Family Medicine

## 2017-04-23 DIAGNOSIS — G4709 Other insomnia: Secondary | ICD-10-CM

## 2017-04-23 MED ORDER — TRAZODONE HCL 50 MG PO TABS: 25.0000 mg | ORAL_TABLET | Freq: Every evening | ORAL | 0 refills | Status: DC | PRN

## 2017-04-30 ENCOUNTER — Ambulatory Visit (INDEPENDENT_AMBULATORY_CARE_PROVIDER_SITE_OTHER): Payer: Self-pay | Admitting: Family Medicine

## 2017-04-30 ENCOUNTER — Encounter: Payer: Self-pay | Admitting: Family Medicine

## 2017-04-30 VITALS — BP 111/69 | HR 58 | Temp 98.6°F | Resp 16 | Ht 66.0 in | Wt 303.0 lb

## 2017-04-30 DIAGNOSIS — H04201 Unspecified epiphora, right lacrimal gland: Secondary | ICD-10-CM

## 2017-04-30 NOTE — Progress Notes (Signed)
Subjective:  Kathryn Stevenson, a 41 year old female with a history of morbid obesity presents complaining of weight gain and right eye discomfort.  Patient reports that she has been steadily since quitting smoking 3 months ago.  States that her appetite is "ferocious".  States that she works third shift and typically eats high calorie high fat meals.  She typically eats 1-2 times per day.  She also drinks soda, coffee, and juice.  She also does not exercise routinely.  A referral was sent previously for bariatric services, patient states that she is not received a call to schedule this appointment.  It was recommended that patient attend a bariatric workshop at [facility], states that she missed the workshop due to work constraints.  Patient is also complaining of discomfort and excessive tearing to the left eye.  She states that she had an eye injury around 6 years ago.  She has not had any problems until now.  Patient continues to use Pataday drops in each eye daily for eye irritation without relief.  She has not been to an ophthalmologist for this problem.  She denies fever, blurred vision, double vision, or eye pain.  She does endorse redness and excessive tearing.  Past Medical History:  Diagnosis Date  . Acid reflux   . Dermatitis   . Fracture, ankle   . Morbid obesity ([place])    Social History   Socioeconomic History  . Marital status: Single    Spouse name: Not on file  . Number of children: Not on file  . Years of education: Not on file  . Highest education level: Not on file  Social Needs  . Financial resource strain: Not on file  . Food insecurity - worry: Not on file  . Food insecurity - inability: Not on file  . Transportation needs - medical: Not on file  . Transportation needs - non-medical: Not on file  Occupational History  . Not on file  Tobacco Use  . Smoking status: Former Smoker    Packs/day: 0.50    Types: Cigarettes  . Smokeless tobacco: Never Used  Substance and  Sexual Activity  . Alcohol use: No  . Drug use: No  . Sexual activity: Yes    Birth control/protection: Surgical  Other Topics Concern  . Not on file  Social History Narrative  . Not on file   No Known Allergies Immunization History  Administered Date(s) Administered  . Pneumococcal Polysaccharide-23 09/20/2015  . Tdap 09/20/2015     Review of Systems Review of Systems  Constitutional: Negative.        Weight gain  HENT: Negative.   Eyes: Positive for discharge (Excessive tearing) and redness. Negative for blurred vision, double vision, photophobia and pain.  Respiratory: Negative.   Gastrointestinal: Negative.   Genitourinary: Negative.   Musculoskeletal: Negative.   Skin: Negative.   Neurological: Negative.   Endo/Heme/Allergies: Negative.   Psychiatric/Behavioral: Negative.     Objective:    BP 111/69 (BP Location: Left Arm, Patient Position: Sitting, Cuff Size: Large)   Pulse (!) 58   Temp 98.6 F (37 C) (Oral)   Resp 16   Ht 5\' 6"  (1.676 m)   Wt (!) 303 lb (137.4 kg)   LMP 04/23/2017   SpO2 100%   BMI 48.91 kg/m   General Appearance:    Alert, cooperative, no distress, appears stated age.  Morbid obesity  Head:    Normocephalic, without obvious abnormality, atraumatic  Eyes:    PERRL, eye redness  bilaterally, no signs of infection, EOM's intact, fundi benign, both eyes  Ears:    Normal TM's and external ear canals, both ears  Nose:   Nares normal, septum midline, mucosa normal, no drainage    or sinus tenderness  Throat:   Lips, mucosa, and tongue normal; teeth and gums normal  Neck:   Supple, symmetrical, trachea midline, no adenopathy;    thyroid:  no enlargement/tenderness/nodules; no carotid   bruit or JVD  Back:     Symmetric, no curvature, ROM normal, no CVA tenderness  Lungs:     Clear to auscultation bilaterally, respirations unlabored  Chest Wall:    No tenderness or deformity   Heart:    Regular rate and rhythm, S1 and S2 normal, no murmur,  rub   or gallop  Abdomen:     Soft, non-tender, bowel sounds active all four quadrants,    no masses, no organomegaly.abdomen pendulous increased abdominal obesity  Neurologic:   CNII-XII intact, normal strength, sensation and reflexes    throughout      Assessment:  BP 111/69 (BP Location: Left Arm, Patient Position: Sitting, Cuff Size: Large)   Pulse (!) 58   Temp 98.6 F (37 C) (Oral)   Resp 16   Ht 5\' 6"  (1.676 m)   Wt (!) 303 lb (137.4 kg)   LMP 04/23/2017   SpO2 100%   BMI 48.91 kg/m   Plan:  1. Morbid obesity due to excess calories [facility]) Patient is increased weight by 4 pounds since previous visit in July Given information on an 1800-calorie carbohydrate modified diet divided over 5-6 small meals. We will also send a new referral to bariatrics for potential bariatric surgery. Also recommend that patient attends bariatric workshop at [facility], given information on next appointment. - Ambulatory referral to General Surgery  2. 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  We will send a referral to ophthalmology for further workup and evaluation.  Hold Pataday drops. - Ambulatory referral to Ophthalmology   RTC: 6 months for a CPE   [name]  MSN, FNP-C Patient [facility] [address] [place], [place] [address] [phone]

## 2017-04-30 NOTE — Patient Instructions (Addendum)
We will resend bariatric referral to [facility]. Recommend a carbohydrate modified, low-fat diet.  Will pursue a modified ketogenic diet. An example of this diet is as follows: Breakfast: 2 slices of bacon and boiled eggs Snack: Nuts or full fat Greek yogurt or cottage cheese or sardines or candy tun Lunch: Small salad, chicken Snack: Refer to previous snack options Dinner: Veggies, salmon    Will send a referral to ophthalmology   Carbohydrate Counting for Diabetes Mellitus, Adult Carbohydrate counting is a method for keeping track of how many carbohydrates you eat. Eating carbohydrates naturally increases the amount of sugar (glucose) in the blood. Counting how many carbohydrates you eat helps keep your blood glucose within normal limits, which helps you manage your diabetes (diabetes mellitus). It is important to know how many carbohydrates you can safely have in each meal. This is different for every person. A diet and nutrition specialist (registered dietitian) can help you make a meal plan and calculate how many carbohydrates you should have at each meal and snack. Carbohydrates are found in the following foods:  Grains, such as breads and cereals.  Dried beans and soy products.  Starchy vegetables, such as potatoes, peas, and corn.  Fruit and fruit juices.  Milk and yogurt.  Sweets and snack foods, such as cake, cookies, candy, chips, and soft drinks.  How do I count carbohydrates? There are two ways to count carbohydrates in food. You can use either of the methods or a combination of both. Reading "Nutrition Facts" on packaged food The "Nutrition Facts" list is included on the labels of almost all packaged foods and beverages in the [place] It includes:  The serving size.  Information about nutrients in each serving, including the grams (g) of carbohydrate per serving.  To use the "Nutrition Facts":  Decide how many servings you will have.  Multiply the number of  servings by the number of carbohydrates per serving.  The resulting number is the total amount of carbohydrates that you will be having.  Learning standard serving sizes of other foods When you eat foods containing carbohydrates that are not packaged or do not include "Nutrition Facts" on the label, you need to measure the servings in order to count the amount of carbohydrates:  Measure the foods that you will eat with a food scale or measuring cup, if needed.  Decide how many standard-size servings you will eat.  Multiply the number of servings by 15. Most carbohydrate-rich foods have about 15 g of carbohydrates per serving. ? For example, if you eat 8 oz (170 g) of strawberries, you will have eaten 2 servings and 30 g of carbohydrates (2 servings x 15 g = 30 g).  For foods that have more than one food mixed, such as soups and casseroles, you must count the carbohydrates in each food that is included.  The following list contains standard serving sizes of common carbohydrate-rich foods. Each of these servings has about 15 g of carbohydrates:   hamburger bun or  English muffin.   oz (15 mL) syrup.   oz (14 g) jelly.  1 slice of bread.  1 six-inch tortilla.  3 oz (85 g) cooked rice or pasta.  4 oz (113 g) cooked dried beans.  4 oz (113 g) starchy vegetable, such as peas, corn, or potatoes.  4 oz (113 g) hot cereal.  4 oz (113 g) mashed potatoes or  of a large baked potato.  4 oz (113 g) canned or frozen fruit.  4 oz (120 mL) fruit juice.  4-6 crackers.  6 chicken nuggets.  6 oz (170 g) unsweetened dry cereal.  6 oz (170 g) plain fat-free yogurt or yogurt sweetened with artificial sweeteners.  8 oz (240 mL) milk.  8 oz (170 g) fresh fruit or one small piece of fruit.  24 oz (680 g) popped popcorn.  Example of carbohydrate counting Sample meal  3 oz (85 g) chicken breast.  6 oz (170 g) brown rice.  4 oz (113 g) corn.  8 oz (240 mL) milk.  8 oz (170  g) strawberries with sugar-free whipped topping. Carbohydrate calculation 1. Identify the foods that contain carbohydrates: ? Rice. ? Corn. ? Milk. ? Strawberries. 2. Calculate how many servings you have of each food: ? 2 servings rice. ? 1 serving corn. ? 1 serving milk. ? 1 serving strawberries. 3. Multiply each number of servings by 15 g: ? 2 servings rice x 15 g = 30 g. ? 1 serving corn x 15 g = 15 g. ? 1 serving milk x 15 g = 15 g. ? 1 serving strawberries x 15 g = 15 g. 4. Add together all of the amounts to find the total grams of carbohydrates eaten: ? 30 g + 15 g + 15 g + 15 g = 75 g of carbohydrates total. This information is not intended to replace advice given to you by your health care provider. Make sure you discuss any questions you have with your health care provider. Document Released: 06/05/2005 Document Revised: 12/24/2015 Document Reviewed: 11/17/2015 Elsevier Interactive Patient Education  [employer].

## 2017-05-02 ENCOUNTER — Encounter: Payer: Self-pay | Admitting: Family Medicine

## 2017-05-15 DIAGNOSIS — Z87891 Personal history of nicotine dependence: Secondary | ICD-10-CM | POA: Insufficient documentation

## 2017-05-15 DIAGNOSIS — F419 Anxiety disorder, unspecified: Secondary | ICD-10-CM | POA: Insufficient documentation

## 2017-08-01 ENCOUNTER — Encounter (HOSPITAL_COMMUNITY): Payer: Self-pay | Admitting: Emergency Medicine

## 2017-08-01 ENCOUNTER — Emergency Department (HOSPITAL_COMMUNITY)
Admission: EM | Admit: 2017-08-01 | Discharge: 2017-08-01 | Disposition: A | Discharge status: Home / Self Care | Payer: Medicaid Other | Attending: Emergency Medicine | Admitting: Emergency Medicine

## 2017-08-01 ENCOUNTER — Other Ambulatory Visit: Payer: Self-pay

## 2017-08-01 DIAGNOSIS — Z87891 Personal history of nicotine dependence: Secondary | ICD-10-CM | POA: Insufficient documentation

## 2017-08-01 DIAGNOSIS — L02412 Cutaneous abscess of left axilla: Secondary | ICD-10-CM | POA: Insufficient documentation

## 2017-08-01 MED ORDER — DOXYCYCLINE HYCLATE 100 MG PO CAPS: 100.0000 mg | ORAL_CAPSULE | Freq: Two times a day (BID) | ORAL | 0 refills | Status: AC

## 2017-08-01 MED ORDER — IBUPROFEN 800 MG PO TABS: 800.0000 mg | ORAL_TABLET | Freq: Once | ORAL | Status: DC

## 2017-08-01 MED ORDER — LIDOCAINE-EPINEPHRINE (PF) 2 %-1:200000 IJ SOLN
10.0000 mL | Freq: Once | INTRAMUSCULAR | Status: DC
Start: 2017-08-01 — End: 2017-08-01
  Filled 2017-08-01: qty 20

## 2017-08-01 NOTE — Discharge Instructions (Signed)
Keep the area clean with warm water and soap at least once daily.  Then, pat the area dry and apply a clean bandage.  Make sure to change the bandage at least once daily.  Please avoid wearing deodorant until the incision closes.  Apply warm compresses for 15-20 minutes as frequently as needed to help the area continue to drain.  Take one tablet of Doxycycline twice daily for the next 5 days.   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  If you develop a fever, chills, or if the area becomes red, hot, or swollen, please return to the emergency department for evaluation.

## 2017-08-01 NOTE — ED Provider Notes (Signed)
[facility] EMERGENCY DEPARTMENT Provider Note   CSN: [id number] Arrival date & time: 08/01/17  0277     History   Chief Complaint Chief Complaint  Patient presents with  . Abscess    HPI Kathryn Stevenson is a 42 y.o. female who presents to the emergency department with a chief complaint of left axilla abscess that began yesterday.  The pain is constant, 8 out of 10, and is worse with movement of the left arm.  She reports associated chills.  She denies fever, redness or warmth to the area.  She has a history of similar that have resolved with I&D.   The history is provided by the patient. No language interpreter was used.    Past Medical History:  Diagnosis Date  . Acid reflux   . Dermatitis   . Fracture, ankle   . Morbid obesity [facility])     Patient Active Problem List   Diagnosis Date Noted  . Right leg pain 03/29/2017  . RUQ abdominal pain 06/30/2016  . Liver lesion 06/30/2016  . Gastroesophageal reflux disease 06/30/2016  . Morbid obesity ([place]) 09/20/2015  . Severe obesity (BMI >= 40) ([place]) 09/20/2015  . Dermatitis 09/20/2015    Past Surgical History:  Procedure Laterality Date  . ANKLE SURGERY Left   . INDUCED ABORTION    . MOUTH SURGERY    . TUBAL LIGATION      OB History    Gravida Para Term Preterm AB Living   6 2 2   4 2    SAB TAB Ectopic Multiple Live Births   1 3             Home Medications    Prior to Admission medications   Medication Sig Start Date End Date Taking? Authorizing Provider  doxycycline (VIBRAMYCIN) 100 MG capsule Take 1 capsule (100 mg total) by mouth 2 (two) times daily for 5 days. 08/01/17 08/06/17  McDonald, Mia A, PA-C  ibuprofen (ADVIL,MOTRIN) 600 MG tablet Take 1 tablet (600 mg total) by mouth every 8 (eight) hours as needed. 03/29/17   Dorena Dew, FNP  omeprazole (PRILOSEC) 40 MG capsule Take 40 mg by mouth daily.    [provider]  traZODone (DESYREL) 50 MG tablet Take 0.5 tablets (25 mg  total) at bedtime as needed by mouth for sleep. 04/23/17   Dorena Dew, FNP    Family History Family History  Problem Relation Age of Onset  . Hypertension Mother   . Ovarian cancer Maternal Grandmother   . Heart attack Maternal Grandmother   . Esophageal cancer Neg Hx   . Colon cancer Neg Hx   . Pancreatic cancer Neg Hx   . Stomach cancer Neg Hx   . Liver disease Neg Hx     Social History Social History   Tobacco Use  . Smoking status: Former Smoker    Packs/day: 0.50    Types: Cigarettes  . Smokeless tobacco: Never Used  Substance Use Topics  . Alcohol use: No  . Drug use: No     Allergies   Patient has no known allergies.   Review of Systems Review of Systems  Constitutional: Positive for chills. Negative for fever.  Skin: Positive for wound.     Physical Exam Updated Vital Signs BP 112/77   Pulse (!) 58   Temp 98.3 F (36.8 C) (Oral)   Resp 18   Ht 5\' 5"  (1.651 m)   Wt (!) 137.4 kg (303 lb)  LMP 07/31/2017   SpO2 100%   BMI 50.42 kg/m   Physical Exam  Constitutional: No distress.  HENT:  Head: Normocephalic.  Eyes: Conjunctivae are normal.  Neck: Neck supple.  Cardiovascular: Normal rate and regular rhythm. Exam reveals no gallop and no friction rub.  No murmur heard. Pulmonary/Chest: Effort normal. No respiratory distress.  Abdominal: Soft. She exhibits no distension.  Musculoskeletal:  4 x 1.5 cm area of induration with some fluctuance noted to the left axilla.  A small amount of purulent drainage is actively expressed.  Minimal surrounding erythema, edema, and warmth.  Neurological: She is alert.  Skin: Skin is warm. No rash noted.  Psychiatric: Her behavior is normal.  Nursing note and vitals reviewed.    ED Treatments / Results  Labs (all labs ordered are listed, but only abnormal results are displayed) Labs Reviewed - No data to display  EKG  EKG Interpretation None       Radiology No results  found.  Procedures .Marland KitchenIncision and Drainage Date/Time: 08/01/2017 9:05 AM Performed by: Joanne Gavel, PA-C Authorized by: Joanne Gavel, PA-C   Consent:    Consent obtained:  Verbal   Consent given by:  Patient   Risks discussed:  Bleeding, incomplete drainage, infection and pain   Alternatives discussed:  No treatment, delayed treatment and observation Location:    Type:  Abscess   Size:  4x1.5 cm    Location: left axilla. Pre-procedure details:    Skin preparation:  Betadine Anesthesia (see MAR for exact dosages):    Anesthesia method:  Local infiltration   Local anesthetic:  Lidocaine 2% WITH epi Procedure details:    Incision types:  Single straight   Scalpel blade:  10   Wound management:  Probed and deloculated   Drainage:  Bloody and purulent   Drainage amount:  Moderate   Wound treatment:  Wound left open   Packing materials:  None Post-procedure details:    Patient tolerance of procedure:  Tolerated well, no immediate complications   (including critical care time)  EMERGENCY DEPARTMENT US SOFT TISSUE INTERPRETATION "Study: Limited Soft Tissue Ultrasound"  INDICATIONS: Soft tissue infection Multiple views of the body part were obtained in real-time with a multi-frequency linear probe  PERFORMED BY: Myself IMAGES ARCHIVED?: Yes SIDE:Left BODY PART:Axilla INTERPRETATION:  Abcess present     Medications Ordered in ED Medications - No data to display   Initial Impression / Assessment and Plan / ED Course  I have reviewed the triage vital signs and the nursing notes.  Pertinent labs & imaging results that were available during my care of the patient were reviewed by me and considered in my medical decision making (see chart for details).     42 year old female presenting with abscess to the left axilla.  Bedside ultrasound with a fluid collection that is amenable to I&D. Patient with skin abscess amenable to incision and drainage.  Bedside ultrasound  was used to select location that appeared to have the largest fluid pocket.  On initial incision, a large amount of fibrous subcutaneous tissue was noted.  A moderate amount of bloody drainage was expressed from the area.  While this was being expressed, a second area began to drain purulent drainage.  A second incision was made near that area to facilitate further drainage.  Given that the abscess appears to be loculated despite using hemostats for the loculation, will send her home with a short course of doxycycline and referral to general surgery. Encouraged home  warm soaks and flushing. Will d/c to home.  Strict return precautions given.  She is hemodynamically stable and in no acute distress.  The patient is safe for discharge at this time.   Final Clinical Impressions(s) / ED Diagnoses   Final diagnoses:  Abscess of left axilla    ED Discharge Orders        Ordered    doxycycline (VIBRAMYCIN) 100 MG capsule  2 times daily     08/01/17 1013       McDonald, Mia A, PA-C 08/01/17 1809    Charlesetta Shanks, MD 08/03/17 [phone]

## 2017-08-01 NOTE — ED Triage Notes (Signed)
Pt c/o 8/10 arm pain related to an abscess under her left arm for the past 2 days.

## 2017-10-29 ENCOUNTER — Ambulatory Visit: Payer: Medicaid Other | Admitting: Family Medicine

## 2017-11-09 ENCOUNTER — Encounter: Payer: Self-pay | Admitting: Family Medicine

## 2017-11-09 ENCOUNTER — Ambulatory Visit (INDEPENDENT_AMBULATORY_CARE_PROVIDER_SITE_OTHER): Payer: BLUE CROSS/BLUE SHIELD | Admitting: Family Medicine

## 2017-11-09 DIAGNOSIS — M25572 Pain in left ankle and joints of left foot: Secondary | ICD-10-CM | POA: Diagnosis not present

## 2017-11-09 DIAGNOSIS — F329 Major depressive disorder, single episode, unspecified: Secondary | ICD-10-CM | POA: Diagnosis not present

## 2017-11-09 DIAGNOSIS — K219 Gastro-esophageal reflux disease without esophagitis: Secondary | ICD-10-CM

## 2017-11-09 DIAGNOSIS — F32A Depression, unspecified: Secondary | ICD-10-CM

## 2017-11-09 MED ORDER — OMEPRAZOLE 40 MG PO CPDR: 40.0000 mg | DELAYED_RELEASE_CAPSULE | Freq: Every day | ORAL | 1 refills | Status: DC

## 2017-11-09 MED ORDER — NAPROXEN 500 MG PO TABS
500.0000 mg | ORAL_TABLET | Freq: Two times a day (BID) | ORAL | 0 refills | Status: DC
Start: 2017-11-09 — End: 2018-05-28

## 2017-11-09 MED ORDER — KETOROLAC TROMETHAMINE 60 MG/2ML IM SOLN
60.0000 mg | Freq: Once | INTRAMUSCULAR | Status: AC
  Administered 2017-11-09: 60 mg via INTRAMUSCULAR

## 2017-11-09 MED ORDER — BUPROPION HCL ER (XL) 150 MG PO TB24: 150.0000 mg | ORAL_TABLET | Freq: Every day | ORAL | 3 refills | Status: DC

## 2017-11-09 NOTE — Progress Notes (Signed)
Subjective:    Patient ID: Kathryn Stevenson, female    DOB: [birth year], 42 y.o.   MRN: [id number]  HPI Kathryn Stevenson a 42 year old female presents for follow-up of chronic conditions. Patient has a history of morbid obesity.  She is complaining of weight gain over the past several months.  She reports that diet consist of mostly high fats and high cholesterol.  She states that she eats out 3-4 times per week.  She also eats sugary snacks.  She also attributes weight gain to quitting smoking 2 months ago.  Patient does not have a history of hypothyroidism or type 2 diabetes.  Her most recent hemoglobin A1c is 5.0.  Patient is complaining of left ankle pain.  She is not identified in inciting event.  Pain is been present over the past 3 days.  Patient has attempted over-the-counter Tylenol with minimal relief.  She last had Tylenol 2 days ago.  Left ankle pain is worsened by prolonged standing and applying full weight.  Patient is also complaining of depression.  Patient has a history of depression.  Depression symptoms include hopelessness and anhedonia.  Patient states that depression has been worsened by stressful family event.  Her cousin passed away several weeks ago.  She discontinued antidepressant medications several months ago.  She currently denies suicidal or homicidal ideations.  Past Medical History:  Diagnosis Date  . Acid reflux   . Dermatitis   . Fracture, ankle   . Morbid obesity ([place])    Social History   Socioeconomic History  . Marital status: Single    Spouse name: Not on file  . Number of children: Not on file  . Years of education: Not on file  . Highest education level: Not on file  Occupational History  . Not on file  Social Needs  . Financial resource strain: Not on file  . Food insecurity:    Worry: Not on file    Inability: Not on file  . Transportation needs:    Medical: Not on file    Non-medical: Not on file  Tobacco Use  . Smoking status: Former  Smoker    Packs/day: 0.50    Types: Cigarettes  . Smokeless tobacco: Never Used  Substance and Sexual Activity  . Alcohol use: No  . Drug use: No  . Sexual activity: Yes    Birth control/protection: Surgical  Lifestyle  . Physical activity:    Days per week: Not on file    Minutes per session: Not on file  . Stress: Not on file  Relationships  . Social connections:    Talks on phone: Not on file    Gets together: Not on file    Attends religious service: Not on file    Active member of club or organization: Not on file    Attends meetings of clubs or organizations: Not on file    Relationship status: Not on file  . Intimate partner violence:    Fear of current or ex partner: Not on file    Emotionally abused: Not on file    Physically abused: Not on file    Forced sexual activity: Not on file  Other Topics Concern  . Not on file  Social History Narrative  . Not on file   Immunization History  Administered Date(s) Administered  . Pneumococcal Polysaccharide-23 09/20/2015  . Tdap 09/20/2015    Review of Systems  Constitutional: Positive for unexpected weight change (weight gain).  HENT: Negative.  Eyes: Negative.   Respiratory: Negative.   Cardiovascular: Negative.   Gastrointestinal: Negative.   Endocrine: Negative.  Negative for polydipsia, polyphagia and polyuria.  Genitourinary: Negative.   Musculoskeletal: Positive for arthralgias (left ankle pain).  Allergic/Immunologic: Negative.   Neurological: Negative.   Hematological: Negative.   Psychiatric/Behavioral: Negative.        Depression        Objective:   Physical Exam  Constitutional: She is oriented to person, place, and time.  Morbid obesity  Eyes: Pupils are equal, round, and reactive to light.  Neck: Normal range of motion.  Cardiovascular: Normal rate, regular rhythm, normal heart sounds and intact distal pulses.  Pulmonary/Chest: Effort normal and breath sounds normal.  Abdominal: Soft.  Bowel sounds are normal.  Abdominal obesity  Neurological: She is alert and oriented to person, place, and time.  Skin: Skin is warm.  Psychiatric: She has a normal mood and affect. Her behavior is normal. Judgment and thought content normal.    BP 108/83 (BP Location: Left Arm, Patient Position: Sitting, Cuff Size: Large)   Pulse (!) 56   Temp 98.3 F (36.8 C) (Oral)   Resp 16   Ht 5\' 6"  (1.676 m)   Wt (!) 330 lb (149.7 kg)   LMP 11/04/2017   SpO2 100%   BMI 53.26 kg/m  Assessment & Plan:  1. Severe obesity (BMI >= 40) (HCC) Patient has increased weight by 27 pounds over the past 5 months. Body mass index is 53.26 kg/m. Recommend a ketogenic diet.  Discussed low carbohydrate, high-protein diet.  Patient given written information and food list.  Goal is to decrease weight by 10% over the next 3 months.  Goal weight loss is 33 pounds  2. Depression, unspecified depression type We will restart Wellbutrin 150 mg extended release daily.   - buPROPion (WELLBUTRIN XL) 150 MG 24 hr tablet; Take 1 tablet (150 mg total) by mouth daily.  Dispense: 30 tablet; Refill: 3  3. Gastroesophageal reflux disease without esophagitis - omeprazole (PRILOSEC) 40 MG capsule; Take 1 capsule (40 mg total) by mouth daily.  Dispense: 30 capsule; Refill: 1  4. Acute left ankle pain Recommend elevating left lower extremity to heart level while at rest.  We will also start naproxen 500 mg twice daily with food. - naproxen (NAPROSYN) 500 MG tablet; Take 1 tablet (500 mg total) by mouth 2 (two) times daily with a meal.  Dispense: 30 tablet; Refill: 0 - ketorolac (TORADOL) injection 60 mg   RTC: 3 months for chronic conditions   [name]  MSN, FNP-C [facility] [address] [place], [place] [address] [phone]

## 2017-11-09 NOTE — Patient Instructions (Signed)
Exercising to Lose Weight Exercising can help you to lose weight. In order to lose weight through exercise, you need to do vigorous-intensity exercise. You can tell that you are exercising with vigorous intensity if you are breathing very hard and fast and cannot hold a conversation while exercising. Moderate-intensity exercise helps to maintain your current weight. You can tell that you are exercising at a moderate level if you have a higher heart rate and faster breathing, but you are still able to hold a conversation. How often should I exercise? Choose an activity that you enjoy and set realistic goals. Your health care provider can help you to make an activity plan that works for you. Exercise regularly as directed by your health care provider. This may include:  Doing resistance training twice each week, such as: ? Push-ups. ? Sit-ups. ? Lifting weights. ? Using resistance bands.  Doing a given intensity of exercise for a given amount of time. Choose from these options: ? 150 minutes of moderate-intensity exercise every week. ? 75 minutes of vigorous-intensity exercise every week. ? A mix of moderate-intensity and vigorous-intensity exercise every week.  Children, pregnant women, people who are out of shape, people who are overweight, and older adults may need to consult a health care provider for individual recommendations. If you have any sort of medical condition, be sure to consult your health care provider before starting a new exercise program. What are some activities that can help me to lose weight?  Walking at a rate of at least 4.5 miles an hour.  Jogging or running at a rate of 5 miles per hour.  Biking at a rate of at least 10 miles per hour.  Lap swimming.  Roller-skating or in-line skating.  Cross-country skiing.  Vigorous competitive sports, such as football, basketball, and soccer.  Jumping rope.  Aerobic dancing. How can I be more active in my day-to-day  activities?  Use the stairs instead of the elevator.  Take a walk during your lunch break.  If you drive, park your car farther away from work or school.  If you take public transportation, get off one stop early and walk the rest of the way.  Make all of your phone calls while standing up and walking around.  Get up, stretch, and walk around every 30 minutes throughout the day. What guidelines should I follow while exercising?  Do not exercise so much that you hurt yourself, feel dizzy, or get very short of breath.  Consult your health care provider prior to starting a new exercise program.  Wear comfortable clothes and shoes with good support.  Drink plenty of water while you exercise to prevent dehydration or heat stroke. Body water is lost during exercise and must be replaced.  Work out until you breathe faster and your heart beats faster. This information is not intended to replace advice given to you by your health care provider. Make sure you discuss any questions you have with your health care provider. Document Released: 07/08/2010 Document Revised: 11/11/2015 Document Reviewed: 11/06/2013 Elsevier Interactive Patient Education  2018 [place] for Gastroesophageal Reflux Disease, Adult When you have gastroesophageal reflux disease (GERD), the foods you eat and your eating habits are very important. Choosing the right foods can help ease your discomfort. What guidelines do I need to follow?  Choose fruits, vegetables, whole grains, and low-fat dairy products.  Choose low-fat meat, fish, and poultry.  Limit fats such as oils, salad dressings, butter,  nuts, and avocado.  Keep a food diary. This helps you identify foods that cause symptoms.  Avoid foods that cause symptoms. These may be different for everyone.  Eat small meals often instead of 3 large meals a day.  Eat your meals slowly, in a place where you are relaxed.  Limit fried  foods.  Cook foods using methods other than frying.  Avoid drinking alcohol.  Avoid drinking large amounts of liquids with your meals.  Avoid bending over or lying down until 2-3 hours after eating. What foods are not recommended? These are some foods and drinks that may make your symptoms worse: Vegetables Tomatoes. Tomato juice. Tomato and spaghetti sauce. Chili peppers. Onion and garlic. Horseradish. Fruits Oranges, grapefruit, and lemon (fruit and juice). Meats High-fat meats, fish, and poultry. This includes hot dogs, ribs, ham, sausage, salami, and bacon. Dairy Whole milk and chocolate milk. Sour cream. Cream. Butter. Ice cream. Cream cheese. Drinks Coffee and tea. Bubbly (carbonated) drinks or energy drinks. Condiments Hot sauce. Barbecue sauce. Sweets/Desserts Chocolate and cocoa. Donuts. Peppermint and spearmint. Fats and Oils High-fat foods. This includes [place] fries and potato chips. Other Vinegar. Strong spices. This includes black pepper, white pepper, red pepper, cayenne, curry powder, cloves, ginger, and chili powder. The items listed above may not be a complete list of foods and drinks to avoid. Contact your dietitian for more information. This information is not intended to replace advice given to you by your health care provider. Make sure you discuss any questions you have with your health care provider. Document Released: 12/05/2011 Document Revised: 11/11/2015 Document Reviewed: 04/09/2013 Elsevier Interactive Patient Education  2017 [employer].

## 2017-12-27 ENCOUNTER — Telehealth: Payer: Self-pay

## 2017-12-27 DIAGNOSIS — R935 Abnormal findings on diagnostic imaging of other abdominal regions, including retroperitoneum: Secondary | ICD-10-CM

## 2017-12-27 DIAGNOSIS — K769 Liver disease, unspecified: Secondary | ICD-10-CM

## 2017-12-27 NOTE — Telephone Encounter (Signed)
Left message on machine to call back  

## 2017-12-27 NOTE — Telephone Encounter (Signed)
Timothy Lasso, RN  Timothy Lasso, RN        Pt needs to have repeat US see 7/11 note

## 2017-12-28 ENCOUNTER — Telehealth: Payer: Self-pay | Admitting: Gastroenterology

## 2017-12-28 NOTE — Telephone Encounter (Signed)
You have been scheduled for an abdominal ultrasound at [facility] Radiology (1st floor of hospital) on 01/03/18 at 8 am. Please arrive 15 minutes prior to your appointment for registration. Make certain not to have anything to eat or drink 6 hours prior to your appointment. Should you need to reschedule your appointment, please contact radiology at [phone]. This test typically takes about 30 minutes to perform.  The patient has been notified of this information and all questions answered.

## 2017-12-28 NOTE — Telephone Encounter (Signed)
A user error has taken place.

## 2018-01-02 ENCOUNTER — Ambulatory Visit (HOSPITAL_COMMUNITY)
Admission: RE | Admit: 2018-01-02 | Discharge: 2018-01-02 | Disposition: A | Discharge status: Home / Self Care | Payer: BLUE CROSS/BLUE SHIELD | Source: Ambulatory Visit | Attending: Gastroenterology | Admitting: Gastroenterology

## 2018-01-02 DIAGNOSIS — D1803 Hemangioma of intra-abdominal structures: Secondary | ICD-10-CM | POA: Insufficient documentation

## 2018-01-02 DIAGNOSIS — R935 Abnormal findings on diagnostic imaging of other abdominal regions, including retroperitoneum: Secondary | ICD-10-CM | POA: Insufficient documentation

## 2018-01-02 DIAGNOSIS — K769 Liver disease, unspecified: Secondary | ICD-10-CM | POA: Diagnosis present

## 2018-01-03 ENCOUNTER — Ambulatory Visit (HOSPITAL_COMMUNITY): Payer: BLUE CROSS/BLUE SHIELD

## 2018-01-04 ENCOUNTER — Other Ambulatory Visit: Payer: Self-pay

## 2018-01-04 DIAGNOSIS — K769 Liver disease, unspecified: Secondary | ICD-10-CM

## 2018-01-09 ENCOUNTER — Ambulatory Visit: Payer: BLUE CROSS/BLUE SHIELD | Admitting: Family Medicine

## 2018-01-10 ENCOUNTER — Ambulatory Visit (HOSPITAL_COMMUNITY): Admission: RE | Admit: 2018-01-10 | Payer: BLUE CROSS/BLUE SHIELD | Source: Ambulatory Visit

## 2018-01-12 ENCOUNTER — Ambulatory Visit (HOSPITAL_COMMUNITY)
Admission: RE | Admit: 2018-01-12 | Discharge: 2018-01-12 | Disposition: A | Discharge status: Home / Self Care | Payer: BLUE CROSS/BLUE SHIELD | Source: Ambulatory Visit | Attending: Gastroenterology | Admitting: Gastroenterology

## 2018-01-12 DIAGNOSIS — K769 Liver disease, unspecified: Secondary | ICD-10-CM

## 2018-02-01 ENCOUNTER — Ambulatory Visit: Payer: BLUE CROSS/BLUE SHIELD | Admitting: Family Medicine

## 2018-03-27 ENCOUNTER — Encounter (HOSPITAL_COMMUNITY): Payer: Self-pay | Admitting: Emergency Medicine

## 2018-03-27 ENCOUNTER — Emergency Department (HOSPITAL_COMMUNITY): Payer: BLUE CROSS/BLUE SHIELD

## 2018-03-27 ENCOUNTER — Emergency Department (HOSPITAL_COMMUNITY)
Admission: EM | Admit: 2018-03-27 | Discharge: 2018-03-27 | Disposition: A | Discharge status: Home / Self Care | Payer: BLUE CROSS/BLUE SHIELD | Attending: Emergency Medicine | Admitting: Emergency Medicine

## 2018-03-27 DIAGNOSIS — Y939 Activity, unspecified: Secondary | ICD-10-CM | POA: Diagnosis not present

## 2018-03-27 DIAGNOSIS — S92031A Displaced avulsion fracture of tuberosity of right calcaneus, initial encounter for closed fracture: Secondary | ICD-10-CM | POA: Diagnosis present

## 2018-03-27 DIAGNOSIS — Y999 Unspecified external cause status: Secondary | ICD-10-CM | POA: Insufficient documentation

## 2018-03-27 DIAGNOSIS — Z79899 Other long term (current) drug therapy: Secondary | ICD-10-CM | POA: Insufficient documentation

## 2018-03-27 DIAGNOSIS — Y929 Unspecified place or not applicable: Secondary | ICD-10-CM | POA: Insufficient documentation

## 2018-03-27 DIAGNOSIS — X501XXA Overexertion from prolonged static or awkward postures, initial encounter: Secondary | ICD-10-CM | POA: Diagnosis not present

## 2018-03-27 DIAGNOSIS — Z87891 Personal history of nicotine dependence: Secondary | ICD-10-CM | POA: Diagnosis not present

## 2018-03-27 MED ORDER — ACETAMINOPHEN 500 MG PO TABS
1000.0000 mg | ORAL_TABLET | Freq: Once | ORAL | Status: AC
  Administered 2018-03-27: 1000 mg via ORAL
  Filled 2018-03-27: qty 2

## 2018-03-27 NOTE — ED Triage Notes (Signed)
Pt reports twisting her R ankle yesterday while moving a dresser, used ice on her ankle, has not taken any meds. Reports unable to bear full weight on ankle

## 2018-03-27 NOTE — ED Provider Notes (Signed)
[facility] Provider Note  CSN: [id number] Arrival date & time: 03/27/18 6314  Chief Complaint(s) Ankle Pain  HPI Kathryn Stevenson is a 42 y.o. female with a history of morbid obesity presents to the emergency department with right ankle pain.  She reports that she rolled her right ankle while moving a dresser.  She states that she misstepped and inverted her ankle.  She felt immediate pain.  Reports that the pain is exacerbated with weightbearing, range of motion and palpation of the dorsal lateral aspect of the foot.  Alleviated by mobility.  Has applied ice.  Endorses mild swelling.  No ecchymosis.  Denies any numbness or tingling.  Denied any other injuries.  HPI  Past Medical History Past Medical History:  Diagnosis Date  . Acid reflux   . Dermatitis   . Fracture, ankle   . Morbid obesity [facility])    Patient Active Problem List   Diagnosis Date Noted  . Right leg pain 03/29/2017  . RUQ abdominal pain 06/30/2016  . Liver lesion 06/30/2016  . Gastroesophageal reflux disease 06/30/2016  . Morbid obesity ([place]) 09/20/2015  . Severe obesity (BMI >= 40) ([place]) 09/20/2015  . Dermatitis 09/20/2015   Home Medication(s) Prior to Admission medications   Medication Sig Start Date End Date Taking? Authorizing Provider  buPROPion (WELLBUTRIN XL) 150 MG 24 hr tablet Take 1 tablet (150 mg total) by mouth daily. 11/09/17   Dorena Dew, FNP  ibuprofen (ADVIL,MOTRIN) 600 MG tablet Take 1 tablet (600 mg total) by mouth every 8 (eight) hours as needed. Patient not taking: Reported on 11/09/2017 03/29/17   Dorena Dew, FNP  naproxen (NAPROSYN) 500 MG tablet Take 1 tablet (500 mg total) by mouth 2 (two) times daily with a meal. 11/09/17   Dorena Dew, FNP  omeprazole (PRILOSEC) 40 MG capsule Take 1 capsule (40 mg total) by mouth daily. 11/09/17   Dorena Dew, FNP  traZODone (DESYREL) 50 MG tablet Take 0.5 tablets (25 mg total) at bedtime as  needed by mouth for sleep. Patient not taking: Reported on 11/09/2017 04/23/17   Dorena Dew, FNP                                                                                                                                    Past Surgical History Past Surgical History:  Procedure Laterality Date  . ANKLE SURGERY Left   . INDUCED ABORTION    . MOUTH SURGERY    . TUBAL LIGATION     Family History Family History  Problem Relation Age of Onset  . Hypertension Mother   . Ovarian cancer Maternal Grandmother   . Heart attack Maternal Grandmother   . Esophageal cancer Neg Hx   . Colon cancer Neg Hx   . Pancreatic cancer Neg Hx   . Stomach cancer Neg Hx   . Liver disease Neg Hx  Social History Social History   Tobacco Use  . Smoking status: Former Smoker    Packs/day: 0.50    Types: Cigarettes  . Smokeless tobacco: Never Used  Substance Use Topics  . Alcohol use: No  . Drug use: No   Allergies Patient has no known allergies.  Review of Systems Review of Systems As noted in HPI Physical Exam Vital Signs  I have reviewed the triage vital signs BP 106/68 (BP Location: Right Arm)   Pulse 70   Temp 98.5 F (36.9 C) (Oral)   Resp 16   SpO2 100%   Physical Exam  Constitutional: She is oriented to person, place, and time. She appears well-developed and well-nourished. No distress.  HENT:  Head: Normocephalic and atraumatic.  Right Ear: External ear normal.  Left Ear: External ear normal.  Nose: Nose normal.  Eyes: Conjunctivae and EOM are normal. No scleral icterus.  Neck: Normal range of motion and phonation normal.  Cardiovascular: Normal rate and regular rhythm.  Pulmonary/Chest: Effort normal. No stridor. No respiratory distress.  Abdominal: She exhibits no distension.  Musculoskeletal: Normal range of motion. She exhibits no edema.       Right ankle: She exhibits swelling. She exhibits no ecchymosis and normal pulse. Tenderness. AITFL tenderness found.  No lateral malleolus, no medial malleolus, no posterior TFL, no head of 5th metatarsal and no proximal fibula tenderness found.  Neurological: She is alert and oriented to person, place, and time.  Skin: She is not diaphoretic.  Psychiatric: She has a normal mood and affect. Her behavior is normal.  Vitals reviewed.   ED Results and Treatments Labs (all labs ordered are listed, but only abnormal results are displayed) Labs Reviewed - No data to display                                                                                                                       EKG  EKG Interpretation  Date/Time:    Ventricular Rate:    PR Interval:    QRS Duration:   QT Interval:    QTC Calculation:   R Axis:     Text Interpretation:        Radiology Dg Ankle Complete Right  Result Date: 03/27/2018 CLINICAL DATA:  Right ankle pain and swelling after twisting injury while moving furniture 1 day ago. EXAM: RIGHT ANKLE - COMPLETE 3+ VIEW COMPARISON:  Right lower leg 05/02/2014 FINDINGS: Diffuse soft tissue swelling about the right ankle. Acute appearing ununited ossicle lateral to the calcaneus likely represents an avulsion fracture. No dislocation at the right ankle. No focal bone lesion or bone destruction. Plantar calcaneal spur. IMPRESSION: Diffuse soft tissue swelling. Ununited ossicle lateral to the calcaneus likely representing avulsion fracture. Electronically Signed   By: Lucienne Capers M.D.   On: 03/27/2018 06:32   Pertinent labs & imaging results that were available during my care of the patient were reviewed by me and considered in my medical decision making (see chart for details).  Medications Ordered in ED Medications  acetaminophen (TYLENOL) tablet 1,000 mg (1,000 mg Oral Given 03/27/18 0651)                                                                                                                                    Procedures Procedures  (including critical care  time)  Medical Decision Making / ED Course I have reviewed the nursing notes for this encounter and the patient's prior records (if available in EHR or on provided paperwork).    Plain film with evidence of calcaneus avulsion fracture.  No other injuries.  Patient placed in a cam walker.  Recommended over-the-counter medication for pain control, and RICE.  Follow-up with primary care provider for continued pain management.  Follow-up with orthopedic surgery if pain does not improve within 2 to 4 weeks.  The patient appears reasonably screened and/or stabilized for discharge and I doubt any other medical condition or other [facility] requiring further screening, evaluation, or treatment in the ED at this time prior to discharge.  The patient is safe for discharge with strict return precautions.   Final Clinical Impression(s) / ED Diagnoses Final diagnoses:  Closed displaced avulsion fracture of tuberosity of right calcaneus, initial encounter   Disposition: Discharge  Condition: Good  I have discussed the results, Dx and Tx plan with the patient who expressed understanding and agree(s) with the plan. Discharge instructions discussed at great length. The patient was given strict return precautions who verbalized understanding of the instructions. No further questions at time of discharge.    ED Discharge Orders    None       Follow Up: Dorena Dew, FNP [address] [address] [phone]  Schedule an appointment as soon as possible for a visit  1-2 weeks for continued pain management.  Shona Needles, MD [address] [place] [address] [phone]  Schedule an appointment as soon as possible for a visit  2-4 weeks if pain is not improving.      This chart was dictated using voice recognition software.  Despite best efforts to proofread,  errors can occur which can change the documentation meaning.   Fatima Blank,  MD 03/27/18 [phone]

## 2018-03-27 NOTE — ED Notes (Signed)
Pt transported to xray 

## 2018-03-27 NOTE — Discharge Instructions (Signed)
You may take over-the-counter medicine for symptomatic relief, such as Tylenol, and/or Motrin. Please limit acetaminophen (Tylenol) to 4000 mg and Ibuprofen (Motrin, Advil, etc.) to 2400 mg for a 24hr period.

## 2018-03-27 NOTE — ED Notes (Signed)
ED Provider at bedside. 

## 2018-03-27 NOTE — ED Notes (Signed)
Patient transported to X-ray 

## 2018-04-30 ENCOUNTER — Ambulatory Visit (HOSPITAL_COMMUNITY)
Admission: EM | Admit: 2018-04-30 | Discharge: 2018-04-30 | Disposition: A | Discharge status: Home / Self Care | Payer: BLUE CROSS/BLUE SHIELD | Attending: Family Medicine | Admitting: Family Medicine

## 2018-04-30 ENCOUNTER — Encounter (HOSPITAL_COMMUNITY): Payer: Self-pay | Admitting: Emergency Medicine

## 2018-04-30 DIAGNOSIS — Z6841 Body Mass Index (BMI) 40.0 and over, adult: Secondary | ICD-10-CM | POA: Insufficient documentation

## 2018-04-30 DIAGNOSIS — Z113 Encounter for screening for infections with a predominantly sexual mode of transmission: Secondary | ICD-10-CM

## 2018-04-30 DIAGNOSIS — K219 Gastro-esophageal reflux disease without esophagitis: Secondary | ICD-10-CM | POA: Insufficient documentation

## 2018-04-30 DIAGNOSIS — Z202 Contact with and (suspected) exposure to infections with a predominantly sexual mode of transmission: Secondary | ICD-10-CM | POA: Insufficient documentation

## 2018-04-30 DIAGNOSIS — Z9889 Other specified postprocedural states: Secondary | ICD-10-CM | POA: Insufficient documentation

## 2018-04-30 DIAGNOSIS — Z79899 Other long term (current) drug therapy: Secondary | ICD-10-CM | POA: Insufficient documentation

## 2018-04-30 NOTE — Discharge Instructions (Signed)
We did lab testing during this visit.  If there are any abnormal findings that require change in medicine or indicate a positive result, you will be notified.  If all of your tests are normal, you will not be called.   ° °

## 2018-04-30 NOTE — ED Provider Notes (Signed)
[facility]    CSN: [id number] Arrival date & time: 04/30/18  1152     History   Chief Complaint Chief Complaint  Patient presents with  . Exposure to STD    HPI Kathryn Stevenson is a 42 y.o. female.    HPI   Patient states she separated from her boyfriend of 1 year.  Since he left he is been texting her back and telling her she needs to go to the doctor to be checked for STD.  He has not told her any specific diagnosis.  She is not having any symptoms.  She has been with only him for over a year.  No abdominal pain.  No fever.  No vaginal discharge.  No dysuria.  She reports that she is certain she is not pregnant.  Past Medical History:  Diagnosis Date  . Acid reflux   . Dermatitis   . Fracture, ankle   . Morbid obesity [facility])     Patient Active Problem List   Diagnosis Date Noted  . Right leg pain 03/29/2017  . RUQ abdominal pain 06/30/2016  . Liver lesion 06/30/2016  . Gastroesophageal reflux disease 06/30/2016  . Morbid obesity ([place]) 09/20/2015  . Severe obesity (BMI >= 40) ([place]) 09/20/2015  . Dermatitis 09/20/2015    Past Surgical History:  Procedure Laterality Date  . ANKLE SURGERY Left   . INDUCED ABORTION    . MOUTH SURGERY    . TUBAL LIGATION      OB History    Gravida  6   Para  2   Term  2   Preterm      AB  4   Living  2     SAB  1   TAB  3   Ectopic      Multiple      Live Births               Home Medications    Prior to Admission medications   Medication Sig Start Date End Date Taking? Authorizing Provider  buPROPion (WELLBUTRIN XL) 150 MG 24 hr tablet Take 1 tablet (150 mg total) by mouth daily. 11/09/17   Dorena Dew, FNP  naproxen (NAPROSYN) 500 MG tablet Take 1 tablet (500 mg total) by mouth 2 (two) times daily with a meal. 11/09/17   Dorena Dew, FNP  omeprazole (PRILOSEC) 40 MG capsule Take 1 capsule (40 mg total) by mouth daily. 11/09/17   Dorena Dew, FNP    Family History Family  History  Problem Relation Age of Onset  . Hypertension Mother   . Ovarian cancer Maternal Grandmother   . Heart attack Maternal Grandmother   . Esophageal cancer Neg Hx   . Colon cancer Neg Hx   . Pancreatic cancer Neg Hx   . Stomach cancer Neg Hx   . Liver disease Neg Hx     Social History Social History   Tobacco Use  . Smoking status: Former Smoker    Packs/day: 0.50    Types: Cigarettes  . Smokeless tobacco: Never Used  Substance Use Topics  . Alcohol use: No  . Drug use: No     Allergies   Patient has no known allergies.   Review of Systems Review of Systems  Constitutional: Negative for chills and fever.  HENT: Negative for ear pain and sore throat.   Eyes: Negative for pain and visual disturbance.  Respiratory: Negative for cough and shortness of breath.  Cardiovascular: Negative for chest pain and palpitations.  Gastrointestinal: Negative for abdominal pain and vomiting.  Genitourinary: Negative for dysuria and hematuria.  Musculoskeletal: Negative for arthralgias and back pain.  Skin: Negative for color change and rash.  Neurological: Negative for seizures and syncope.  All other systems reviewed and are negative.    Physical Exam Triage Vital Signs ED Triage Vitals [04/30/18 1207]  Enc Vitals Group     BP (!) 166/99     Pulse Rate 88     Resp 18     Temp 98.4 F (36.9 C)     Temp Source Oral     SpO2 100 %     Weight      Height      Head Circumference      Peak Flow      Pain Score 0     Pain Loc      Pain Edu?      Excl. in [place]?    No data found.  Updated Vital Signs BP (!) 166/99 (BP Location: Left Arm)   Pulse 88   Temp 98.4 F (36.9 C) (Oral)   Resp 18   SpO2 100%   Visual Acuity Right Eye Distance:   Left Eye Distance:   Bilateral Distance:    Right Eye Near:   Left Eye Near:    Bilateral Near:     Physical Exam  Constitutional: She appears well-developed and well-nourished. No distress.  Asymptomatic  HENT:    Head: Normocephalic and atraumatic.  Mouth/Throat: Oropharynx is clear and moist.  Eyes: Pupils are equal, round, and reactive to light. Conjunctivae are normal.  Neck: Normal range of motion.  Cardiovascular: Normal rate.  Pulmonary/Chest: Effort normal. No respiratory distress.  Abdominal: Soft. She exhibits no distension.  Musculoskeletal: Normal range of motion. She exhibits no edema.  Neurological: She is alert.  Skin: Skin is warm and dry.     UC Treatments / Results  Labs (all labs ordered are listed, but only abnormal results are displayed) Labs Reviewed  HIV ANTIBODY (ROUTINE TESTING W REFLEX)  RPR  CERVICOVAGINAL ANCILLARY ONLY    EKG None  Radiology No results found.  Procedures Procedures (including critical care time)  Medications Ordered in UC Medications - No data to display  Initial Impression / Assessment and Plan / UC Course  I have reviewed the triage vital signs and the nursing notes.  Pertinent labs & imaging results that were available during my care of the patient were reviewed by me and considered in my medical decision making (see chart for details).     Since she is not having any symptoms, and has possible exposure she chooses not to have treatment today.  It is offered to her. Final Clinical Impressions(s) / UC Diagnoses   Final diagnoses:  Possible exposure to STD     Discharge Instructions     We did lab testing during this visit.  If there are any abnormal findings that require change in medicine or indicate a positive result, you will be notified.  If all of your tests are normal, you will not be called.      ED Prescriptions    None     Controlled Substance Prescriptions [place] Controlled Substance Registry consulted? Not Applicable   Raylene Everts, MD 04/30/18 1450

## 2018-04-30 NOTE — ED Triage Notes (Signed)
Pt here for STD screening. 

## 2018-05-01 LAB — CERVICOVAGINAL ANCILLARY ONLY
Chlamydia: NEGATIVE
Neisseria Gonorrhea: NEGATIVE
Trichomonas: NEGATIVE

## 2018-05-28 ENCOUNTER — Encounter (HOSPITAL_COMMUNITY): Payer: Self-pay

## 2018-05-28 ENCOUNTER — Telehealth (HOSPITAL_COMMUNITY): Payer: Self-pay | Admitting: Emergency Medicine

## 2018-05-28 ENCOUNTER — Ambulatory Visit (HOSPITAL_COMMUNITY)
Admission: EM | Admit: 2018-05-28 | Discharge: 2018-05-28 | Disposition: A | Discharge status: Home / Self Care | Payer: Medicaid Other | Attending: Physician Assistant | Admitting: Physician Assistant

## 2018-05-28 DIAGNOSIS — L03115 Cellulitis of right lower limb: Secondary | ICD-10-CM

## 2018-05-28 DIAGNOSIS — L0390 Cellulitis, unspecified: Secondary | ICD-10-CM

## 2018-05-28 MED ORDER — NAPROXEN 500 MG PO TABS: 500.0000 mg | ORAL_TABLET | Freq: Two times a day (BID) | ORAL | 0 refills | Status: DC

## 2018-05-28 MED ORDER — DOXYCYCLINE HYCLATE 100 MG PO CAPS: 100.0000 mg | ORAL_CAPSULE | Freq: Two times a day (BID) | ORAL | 0 refills | Status: DC

## 2018-05-28 NOTE — ED Provider Notes (Signed)
05/28/2018 12:29 PM   DOB: 14-Oct-1975 / MRN: [id number]  SUBJECTIVE:  Kathryn Stevenson is a 42 y.o. female presenting for "insect bite" that started about 4 days ago.  Assoicates pain and redness about the inner right thigh.  Denies paresthesia, fever, chills, nausea.  Has tried nothing.   She has No Known Allergies.   She  has a past medical history of Acid reflux, Dermatitis, Fracture, ankle, and Morbid obesity ([place]).    She  reports that she has quit smoking. Her smoking use included cigarettes. She smoked 0.50 packs per day. She has never used smokeless tobacco. She reports that she does not drink alcohol or use drugs. She  reports that she currently engages in sexual activity. She reports using the following method of birth control/protection: Surgical. The patient  has a past surgical history that includes Tubal ligation; Ankle surgery (Left); Induced abortion; and Mouth surgery.  Her family history includes Heart attack in her maternal grandmother; Hypertension in her mother; Ovarian cancer in her maternal grandmother.  ROS per HPI OBJECTIVE:  BP 131/70 (BP Location: Right Arm)   Pulse 75   Temp 98.7 F (37.1 C) (Skin)   Resp 18   LMP 05/03/2018   SpO2 100%   Wt Readings from Last 3 Encounters:  11/09/17 (!) 330 lb (149.7 kg)  08/01/17 (!) 303 lb (137.4 kg)  04/30/17 (!) 303 lb (137.4 kg)   Temp Readings from Last 3 Encounters:  05/28/18 98.7 F (37.1 C) (Skin)  04/30/18 98.4 F (36.9 C) (Oral)  03/27/18 98.5 F (36.9 C) (Oral)   BP Readings from Last 3 Encounters:  05/28/18 131/70  04/30/18 (!) 166/99  03/27/18 99/71   Pulse Readings from Last 3 Encounters:  05/28/18 75  04/30/18 88  03/27/18 67    Physical Exam  Constitutional: She is oriented to person, place, and time. She appears well-nourished. No distress.  Eyes: Pupils are equal, round, and reactive to light. EOM are normal.  Cardiovascular: Normal rate.  Pulmonary/Chest: Effort normal.  Abdominal:  She exhibits no distension.  Neurological: She is alert and oriented to person, place, and time. No cranial nerve deficit. Gait normal.  Skin: Skin is dry. She is not diaphoretic.     Psychiatric: She has a normal mood and affect.  Vitals reviewed.   No results found for this or any previous visit (from the past 72 hour(s)).  No results found.  ASSESSMENT AND PLAN:   Cellulitis, unspecified cellulitis site: Large local reaction versus cellulitis.  Will cover for the latter.  Advised Benadryl for itching and Tylenol for pain.  RTC as needed.   Discharge Instructions     Please use a warm compress often on the affected area.  You can take Benadryl 25 mg every 6 hours for itching.  Please take the doxycycline exactly as prescribed and do not miss doses.  Tylenol is sold over-the-counter and 650 mg capsules.  I recommend that you take 2 of those every 8 hours as needed.  Please come back in a few days if your symptoms are not improving.  I have checked the system and your tetanus shot is current.       The patient is advised to call or return to clinic if she does not see an improvement in symptoms, or to seek the care of the closest emergency department if she worsens with the above plan.   Philis Fendt, MHS, PA-C 05/28/2018 12:29 PM   Tereasa Coop, PA-C 05/28/18  1231  

## 2018-05-28 NOTE — ED Triage Notes (Signed)
Pt present a insect bite on the inside of her right leg.  Pt states that the bump is warmed to touch.

## 2018-05-28 NOTE — Telephone Encounter (Signed)
Pt here stating doxy was never sent to pharmacy, requesting it sent to [facility].

## 2018-05-28 NOTE — Discharge Instructions (Signed)
Please use a warm compress often on the affected area.  You can take Benadryl 25 mg every 6 hours for itching.  Please take the doxycycline exactly as prescribed and do not miss doses.  Tylenol is sold over-the-counter and 650 mg capsules.  I recommend that you take 2 of those every 8 hours as needed.  Please come back in a few days if your symptoms are not improving.  I have checked the system and your tetanus shot is current.

## 2018-06-19 DIAGNOSIS — K869 Disease of pancreas, unspecified: Secondary | ICD-10-CM

## 2018-06-19 HISTORY — DX: Disease of pancreas, unspecified: K86.9

## 2018-07-08 ENCOUNTER — Encounter: Payer: Self-pay | Admitting: Family Medicine

## 2018-07-08 ENCOUNTER — Ambulatory Visit (INDEPENDENT_AMBULATORY_CARE_PROVIDER_SITE_OTHER): Payer: Self-pay | Admitting: Family Medicine

## 2018-07-08 VITALS — BP 111/58 | HR 65 | Temp 98.7°F | Resp 16 | Ht 66.0 in | Wt 334.0 lb

## 2018-07-08 DIAGNOSIS — L700 Acne vulgaris: Secondary | ICD-10-CM

## 2018-07-08 DIAGNOSIS — F32A Depression, unspecified: Secondary | ICD-10-CM

## 2018-07-08 DIAGNOSIS — K219 Gastro-esophageal reflux disease without esophagitis: Secondary | ICD-10-CM

## 2018-07-08 DIAGNOSIS — F329 Major depressive disorder, single episode, unspecified: Secondary | ICD-10-CM

## 2018-07-08 MED ORDER — BUPROPION HCL ER (XL) 150 MG PO TB24: 150.0000 mg | ORAL_TABLET | Freq: Every day | ORAL | 3 refills | Status: DC

## 2018-07-08 MED ORDER — PANTOPRAZOLE SODIUM 40 MG PO TBEC: 40.0000 mg | DELAYED_RELEASE_TABLET | Freq: Every day | ORAL | 3 refills | Status: DC

## 2018-07-08 MED ORDER — MINOCYCLINE HCL 100 MG PO CAPS: 100.0000 mg | ORAL_CAPSULE | Freq: Two times a day (BID) | ORAL | 2 refills | Status: DC

## 2018-07-08 NOTE — Patient Instructions (Signed)
Pantoprazole tablets What is this medicine? PANTOPRAZOLE (pan TOE pra zole) prevents the production of acid in the stomach. It is used to treat gastroesophageal reflux disease (GERD), inflammation of the esophagus, and Zollinger-Ellison syndrome. This medicine may be used for other purposes; ask your health care provider or pharmacist if you have questions. COMMON BRAND NAME(S): Protonix What should I tell my health care provider before I take this medicine? They need to know if you have any of these conditions: -liver disease -low levels of magnesium in the blood -lupus -an unusual or allergic reaction to omeprazole, lansoprazole, pantoprazole, rabeprazole, other medicines, foods, dyes, or preservatives -pregnant or trying to get pregnant -breast-feeding How should I use this medicine? Take this medicine by mouth. Swallow the tablets whole with a drink of water. Follow the directions on the prescription label. Do not crush, break, or chew. Take your medicine at regular intervals. Do not take your medicine more often than directed. Talk to your pediatrician regarding the use of this medicine in children. While this drug may be prescribed for children as young as 5 years for selected conditions, precautions do apply. Overdosage: If you think you have taken too much of this medicine contact a poison control center or emergency room at once. NOTE: This medicine is only for you. Do not share this medicine with others. What if I miss a dose? If you miss a dose, take it as soon as you can. If it is almost time for your next dose, take only that dose. Do not take double or extra doses. What may interact with this medicine? Do not take this medicine with any of the following medications: -atazanavir -nelfinavir This medicine may also interact with the following medications: -ampicillin -delavirdine -erlotinib -iron salts -medicines for fungal infections like ketoconazole, itraconazole and  voriconazole -methotrexate -mycophenolate mofetil -warfarin This list may not describe all possible interactions. Give your health care provider a list of all the medicines, herbs, non-prescription drugs, or dietary supplements you use. Also tell them if you smoke, drink alcohol, or use illegal drugs. Some items may interact with your medicine. What should I watch for while using this medicine? It can take several days before your stomach pain gets better. Check with your doctor or health care professional if your condition does not start to get better, or if it gets worse. You may need blood work done while you are taking this medicine. This medicine may cause a decrease in vitamin B12. You should make sure that you get enough vitamin B12 while you are taking this medicine. Discuss the foods you eat and the vitamins you take with your health care professional. What side effects may I notice from receiving this medicine? Side effects that you should report to your doctor or health care professional as soon as possible: - allergic reactions like skin rash, itching or hives, swelling of the face, lips, or tongue - bone, muscle or joint pain - breathing problems - chest pain or chest tightness - dark yellow or brown urine - dizziness - fast, irregular heartbeat - feeling faint or lightheaded - fever or sore throat - muscle spasm - palpitations - rash on cheeks or arms that gets worse in the sun - redness, blistering, peeling or loosening of the skin, including inside the mouth - seizures -stomach polyps - tremors - unusual bleeding or bruising - unusually weak or tired - yellowing of the eyes or skin Side effects that usually do not require medical attention (report to your  doctor or health care professional if they continue or are bothersome): - constipation - diarrhea - dry mouth - headache - nausea This list may not describe all possible side effects. Call  your doctor for medical advice about side effects. You may report side effects to FDA at 1-800-FDA-1088. Where should I keep my medicine? Keep out of the reach of children. Store at room temperature between 15 and 30 degrees C (59 and 86 degrees F). Protect from light and moisture. Throw away any unused medicine after the expiration date. NOTE: This sheet is a summary. It may not cover all possible information. If you have questions about this medicine, talk to your doctor, pharmacist, or health care provider.  2019 Elsevier/Gold Standard (2017-01-19 13:51:59) Minocycline tablets or capsules What is this medicine? MINOCYCLINE (mi noe SYE kleen) is a tetracycline antibiotic. It is used to treat certain kinds of bacterial infections. It will not work for colds, flu, or other viral infections. This medicine may be used for other purposes; ask your health care provider or pharmacist if you have questions. COMMON BRAND NAME(S): Cleeravue, Dynacin, Minocin, Minocin Kit, Minocin PAC, Myrac What should I tell my health care provider before I take this medicine? They need to know if you have any of these conditions: -kidney disease -liver disease -an unusual or allergic reaction to minocycline, tetracycline antibiotics, other medicines, foods, dyes, or preservatives -pregnant or trying to get pregnant -breast-feeding How should I use this medicine? Take this medicine by mouth with a full glass of water. Follow the directions on the prescription label. You can take it with or without food. If it upsets your stomach, take it with food. Take your medicine at regular intervals. Do not take your medicine more often than directed. Take all of your medicine as directed even if you think you are better. Do not skip doses or stop your medicine early. Talk to your pediatrician regarding the use of this medicine in children. While this drug may be prescribed for children as young as 8 years for selected conditions,  precautions do apply. Overdosage: If you think you have taken too much of this medicine contact a poison control center or emergency room at once. NOTE: This medicine is only for you. Do not share this medicine with others. What if I miss a dose? If you miss a dose, take it as soon as you can. If it is almost time for your next dose, take only that dose. Do not take double or extra doses. What may interact with this medicine? Do not take this medicine with any of the following medications: -acitretin This medicine may also interact with the following medications: -antacids -birth control pills -certain medicines that treat or prevent blood clots like warfarin -ergot alkaloids like dihydroergotamine, ergonovine, ergotamine, methylergonovine -iron supplements -isotretinoin -methoxyflurane -other antibiotics like penicillin This list may not describe all possible interactions. Give your health care provider a list of all the medicines, herbs, non-prescription drugs, or dietary supplements you use. Also tell them if you smoke, drink alcohol, or use illegal drugs. Some items may interact with your medicine. What should I watch for while using this medicine? Tell your doctor or healthcare professional if your symptoms do not start to get better or if they get worse. Do not treat diarrhea with over the counter products. Contact your doctor if you have diarrhea that lasts more than 2 days or if it is severe and watery. This medicine can make you more sensitive to the sun.  Keep out of the sun. If you cannot avoid being in the sun, wear protective clothing and use sunscreen. Do not use sun lamps or tanning beds/booths. Tell your doctor or health care professional right away if you have any change in your eyesight. Birth control pills may not work properly while you are taking this medicine. Talk to your doctor about using an extra method of birth control. You may get drowsy or dizzy. Do not drive, use  machinery, or do anything that needs mental alertness until you know how this medicine affects you. Do not stand or sit up quickly, especially if you are an older patient. This reduces the risk of dizzy or fainting spells. If you are being treated for a sexually transmitted infection, avoid sexual contact until you have finished your treatment. Your sexual partner may also need treatment. What side effects may I notice from receiving this medicine? Side effects that you should report to your doctor or health care professional as soon as possible: -allergic reactions like skin rash, itching or hives, swelling of the face, lips, or tongue -bloody or watery diarrhea -changes in vision -fever -redness, blistering, peeling or loosening of the skin, including inside the mouth -seizures -signs and symptoms of liver injury like dark yellow or brown urine; general ill feeling or flu-like symptoms; light-colored stools; loss of appetite; nausea; right upper belly pain; unusually weak or tired; yellowing of the eyes or skin -trouble passing urine or change in the amount of urine -trouble swallowing Side effects that usually do not require medical attention (report to your doctor or health care professional if they continue or are bothersome): -decreased hearing or ringing in the ears -diarrhea -dizziness -headache -loss of appetite -nausea, vomiting This list may not describe all possible side effects. Call your doctor for medical advice about side effects. You may report side effects to FDA at 1-800-FDA-1088. Where should I keep my medicine? Keep out of the reach of children. Store at room temperature between 20 and 25 degrees C (68 and 77 degrees F). Throw away any unused medicine after the expiration date. NOTE: This sheet is a summary. It may not cover all possible information. If you have questions about this medicine, talk to your doctor, pharmacist, or health care provider.  2019 Elsevier/Gold  Standard (2015-11-01 16:58:58) Acne  Acne is a skin problem that causes small, red bumps (pimples) and other skin changes. The skin has tiny holes called pores. Each pore has an oil gland. Acne happens when the pores get blocked. The pores may become red, sore, and swollen. They may also become infected. Acne is common among teenagers. Acne usually goes away over time. What are the causes? This condition may be caused when:  Oil glands get blocked by oil, dead skin cells, and dirt.  Bacteria that live in the oil glands increase in number and cause infection. Acne can start with changes in hormones. These changes can occur:  When children mature into their teens (adolescence).  When women get their period (menstrual cycle).  When women are pregnant. Some things can make acne worse. They include:  Cosmetics and hair products that have oil in them.  Stress.  Diseases that cause changes in hormones.  Some medicines.  Headbands, backpacks, or shoulder pads.  Being near certain oils and chemicals.  Foods that are high in sugars. These include dairy products, sweets, and chocolates. What increases the risk? You are more likely to develop this condition if:  You are a teenager.  You have a family history of acne. What are the signs or symptoms? Symptoms of this condition include:  Small, red bumps (pimples or papules).  Whiteheads.  Blackheads.  Small, pus-filled pimples (pustules).  Big, red pimples or pustules that feel tender. Acne that is very bad can cause:  An abscess. This is an area that has pus.  Cysts. These are hard, painful sacs that have fluid.  Scars. These can happen after large pimples heal. How is this treated? Treatment for this condition depends on how bad your acne is. It may include:  Creams and lotions. These can: ? Keep the pores of your skin open. ? Prevent infections and swelling.  Medicines that treat infections (antibiotics). These  can be put on your skin or taken as pills.  Pills that decrease the amount of oil in your skin.  Birth control pills.  Light or laser treatments.  Shots of medicine into the areas with acne.  Chemicals that cause the skin to peel.  Surgery. Follow these instructions at home: Good skin care is the most important thing you can do to treat your acne. Take care of your skin as told by your doctor. You may be told to do these things:  Wash your skin gently at least two times each day. You should also wash your skin: ? After you exercise. ? Before you go to bed.  Use mild soap.  Use a water-based skin moisturizer after you wash your skin.  Use a sunscreen or sunblock with SPF 30 or greater. This is very important if you are using acne medicines.  Choose cosmetics that will not block your oil glands (are noncomedogenic). Medicines  Take over-the-counter and prescription medicines only as told by your doctor.  If you were prescribed an antibiotic medicine, use it or take it as told by your doctor. Do not stop using the antibiotic even if your acne gets better. General instructions  Keep your hair clean and off your face. Shampoo your hair on a regular basis. If you have oily hair, you may need to wash it every day.  Avoid wearing tight headbands or hats.  Avoid picking or squeezing your pimples. That can make your acne worse and cause it to scar.  Shave gently. Only shave when you have to.  Keep a food journal. This can help you see if any foods are linked to your acne.  Keep all follow-up visits as told by your doctor. This is important. Contact a doctor if:  Your acne is not better after eight weeks.  Your acne gets worse.  You have a large area of skin that is red or tender.  You think that you are having side effects from any acne medicine. Summary  Acne is a skin problem that causes pimples. Acne is common among teenagers. Acne usually goes away over time.  Acne  starts with changes in your hormones. Other causes include stress, diet, and some medicines.  Follow your doctor's instructions on how to take care of your skin. Good skin care is the most important thing you can do to treat your acne.  Take over-the-counter and prescription medicines only as told by your doctor.  Contact your doctor if you think that you are having side effects from any acne medicine. This information is not intended to replace advice given to you by your health care provider. Make sure you discuss any questions you have with your health care provider. Document Released: 05/25/2011 Document Revised: 10/16/2017 Document  Reviewed: 10/16/2017 Elsevier Interactive Patient Education  2019 [employer] for Gastroesophageal Reflux Disease, Adult When you have gastroesophageal reflux disease (GERD), the foods you eat and your eating habits are very important. Choosing the right foods can help ease your discomfort. Think about working with a nutrition specialist (dietitian) to help you make good choices. What are tips for following this plan?  Meals  Choose healthy foods that are low in fat, such as fruits, vegetables, whole grains, low-fat dairy products, and lean meat, fish, and poultry.  Eat small meals often instead of 3 large meals a day. Eat your meals slowly, and in a place where you are relaxed. Avoid bending over or lying down until 2-3 hours after eating.  Avoid eating meals 2-3 hours before bed.  Avoid drinking a lot of liquid with meals.  Cook foods using methods other than frying. Bake, grill, or broil food instead.  Avoid or limit: ? Chocolate. ? Peppermint or spearmint. ? Alcohol. ? Pepper. ? Black and decaffeinated coffee. ? Black and decaffeinated tea. ? Bubbly (carbonated) soft drinks. ? Caffeinated energy drinks and soft drinks.  Limit high-fat foods such as: ? Fatty meat or fried foods. ? Whole milk, cream, butter, or ice  cream. ? Nuts and nut butters. ? Pastries, donuts, and sweets made with butter or shortening.  Avoid foods that cause symptoms. These foods may be different for everyone. Common foods that cause symptoms include: ? Tomatoes. ? Oranges, lemons, and limes. ? Peppers. ? Spicy food. ? Onions and garlic. ? Vinegar. Lifestyle  Maintain a healthy weight. Ask your doctor what weight is healthy for you. If you need to lose weight, work with your doctor to do so safely.  Exercise for at least 30 minutes for 5 or more days each week, or as told by your doctor.  Wear loose-fitting clothes.  Do not smoke. If you need help quitting, ask your doctor.  Sleep with the head of your bed higher than your feet. Use a wedge under the mattress or blocks under the bed frame to raise the head of the bed. Summary  When you have gastroesophageal reflux disease (GERD), food and lifestyle choices are very important in easing your symptoms.  Eat small meals often instead of 3 large meals a day. Eat your meals slowly, and in a place where you are relaxed.  Limit high-fat foods such as fatty meat or fried foods.  Avoid bending over or lying down until 2-3 hours after eating.  Avoid peppermint and spearmint, caffeine, alcohol, and chocolate. This information is not intended to replace advice given to you by your health care provider. Make sure you discuss any questions you have with your health care provider. Document Released: 12/05/2011 Document Revised: 07/11/2016 Document Reviewed: 07/11/2016 Elsevier Interactive Patient Education  2019 [employer].

## 2018-07-08 NOTE — Progress Notes (Signed)
[facility]   Progress Note: General Provider: [name], FNP  SUBJECTIVE:   Kathryn Stevenson is a 43 y.o. female who  has a past medical history of Acid reflux, Dermatitis, Fracture, ankle, and Morbid obesity ([place]).. Patient presents today for Abdominal Pain (after eating. only after eating x 2 weeks  ); Skin Problem (sores all over skin and on back. saw dermatology in past  ); and Leg Pain (fell and hurt left lower leg )  Patient presents with abdominal pain for the past few weeks that is worsened with food intake.  Patient with a history of GERD.  Was on Prilosec and Zantac in the past but has stopped due to not having it.  Patient notices increased symptoms with acidic foods.  Patient states that she changed her diet in the past 2 weeks and has had some improvement. Patient presents with sores on her back and upper arms.  Patient states in the past she is seen by dermatology and was told she had '' old lady acne''.  Patient was given topical medications without relief.  She was advised that if medicines did not want to return to care and she would be provided an oral medication.  Patient has not returned to dermatology for this and would like to see if she can get an antibiotic today. Patient with a history of smoking.  She stopped smoking with the use of Wellbutrin.  States that she would like to restart Wellbutrin to help with her sadness and weight gain.  Patient denies suicidal ideations, suicidal intent, or plan. Patient also reports left lower leg pain status post falling and hitting her leg on the bathtub.  Patient reports that there was a bruise to the area that is now resolved.  Mild tenderness noted with touch.  Review of Systems  Constitutional: Negative.   HENT: Negative.   Eyes: Negative.   Respiratory: Negative.   Cardiovascular: Negative.   Gastrointestinal: Positive for abdominal pain and heartburn.  Genitourinary:  Negative.   Musculoskeletal: Positive for falls (1 time getting into the shower.  Hit left leg.).  Skin: Positive for itching and rash.  Neurological: Negative.   Psychiatric/Behavioral: Negative.      OBJECTIVE: BP (!) 111/58 (BP Location: Right Arm, Patient Position: Sitting, Cuff Size: Large)   Pulse 65   Temp 98.7 F (37.1 C) (Oral)   Resp 16   Ht 5\' 6"  (1.676 m)   Wt (!) 334 lb (151.5 kg)   LMP 06/23/2018   SpO2 98%   BMI 53.91 kg/m   Wt Readings from Last 3 Encounters:  07/08/18 (!) 334 lb (151.5 kg)  11/09/17 (!) 330 lb (149.7 kg)  08/01/17 (!) 303 lb (137.4 kg)     Physical Exam Vitals signs and nursing note reviewed.  Constitutional:      General: She is not in acute distress.    Appearance: She is well-developed.  HENT:     Head: Normocephalic and atraumatic.  Eyes:     Conjunctiva/sclera: Conjunctivae normal.     Pupils: Pupils are equal, round, and reactive to light.  Neck:     Musculoskeletal: Normal range of motion.  Cardiovascular:     Rate and Rhythm: Normal rate and regular rhythm.     Heart sounds: Normal heart sounds.  Pulmonary:     Effort: Pulmonary effort is normal. No respiratory distress.     Breath sounds: Normal breath sounds.  Abdominal:  General: Bowel sounds are normal. There is no distension.     Palpations: Abdomen is soft.     Tenderness: There is abdominal tenderness in the epigastric area.  Musculoskeletal: Normal range of motion.  Skin:    General: Skin is warm and dry.     Findings: Rash (Multiple comedones noted to the back and back of bilateral upper arms.  Various levels of excoriation.  No secondary infections noted.) present.  Neurological:     Mental Status: She is alert and oriented to person, place, and time.  Psychiatric:        Behavior: Behavior normal.        Thought Content: Thought content normal.     ASSESSMENT/PLAN:  1. Acne vulgaris Will try minocycline for acne. - minocycline (MINOCIN) 100 MG  capsule; Take 1 capsule (100 mg total) by mouth 2 (two) times daily.  Dispense: 60 capsule; Refill: 2  2. Gastroesophageal reflux disease, esophagitis presence not specified Discussed food choices can exacerbate GERD.  Also discussed losing weight to help manage GERD. - pantoprazole (PROTONIX) 40 MG tablet; Take 1 tablet (40 mg total) by mouth daily.  Dispense: 30 tablet; Refill: 3 - HELICOBACTER PYLORI  ANTIBODY, IGM  3. Depression, unspecified depression type Patient denies suicidal ideations, intent, or plan.  Discussed benefits and risks of medication. - buPROPion (WELLBUTRIN XL) 150 MG 24 hr tablet; Take 1 tablet (150 mg total) by mouth daily.  Dispense: 30 tablet; Refill: 3   Liver MRI scheduled and informed of the appt in office visit.    Return in about 25 days (around 08/02/2018) for GERD and Wellbutrin.    The patient was given clear instructions to go to ER or return to medical center if symptoms do not improve, worsen or new problems develop. The patient verbalized understanding and agreed with plan of care.   [name], FNP-BC [facility] [address] [place], [place] [address] [phone]

## 2018-07-10 LAB — HELICOBACTER PYLORI  ANTIBODY, IGM: H pylori, IgM Abs: <9 units (ref 0.0–8.9)

## 2018-07-13 ENCOUNTER — Other Ambulatory Visit: Payer: Self-pay

## 2018-07-13 ENCOUNTER — Other Ambulatory Visit (HOSPITAL_COMMUNITY): Payer: Self-pay | Admitting: Gastroenterology

## 2018-07-13 ENCOUNTER — Emergency Department (HOSPITAL_COMMUNITY): Payer: Self-pay

## 2018-07-13 ENCOUNTER — Encounter (HOSPITAL_COMMUNITY): Payer: Self-pay | Admitting: *Deleted

## 2018-07-13 ENCOUNTER — Ambulatory Visit (HOSPITAL_COMMUNITY)
Admission: RE | Admit: 2018-07-13 | Discharge: 2018-07-13 | Disposition: A | Discharge status: Home / Self Care | Payer: Self-pay | Source: Ambulatory Visit | Attending: Gastroenterology | Admitting: Gastroenterology

## 2018-07-13 ENCOUNTER — Inpatient Hospital Stay (HOSPITAL_COMMUNITY)
Admission: EM | Admit: 2018-07-13 | Discharge: 2018-07-18 | DRG: 441 | Disposition: A | Discharge status: Home / Self Care | Payer: Self-pay | Attending: Internal Medicine | Admitting: Internal Medicine

## 2018-07-13 DIAGNOSIS — K7200 Acute and subacute hepatic failure without coma: Secondary | ICD-10-CM | POA: Diagnosis present

## 2018-07-13 DIAGNOSIS — R945 Abnormal results of liver function studies: Secondary | ICD-10-CM

## 2018-07-13 DIAGNOSIS — Z87891 Personal history of nicotine dependence: Secondary | ICD-10-CM

## 2018-07-13 DIAGNOSIS — Z6841 Body Mass Index (BMI) 40.0 and over, adult: Secondary | ICD-10-CM

## 2018-07-13 DIAGNOSIS — K7689 Other specified diseases of liver: Secondary | ICD-10-CM | POA: Diagnosis present

## 2018-07-13 DIAGNOSIS — R7989 Other specified abnormal findings of blood chemistry: Secondary | ICD-10-CM

## 2018-07-13 DIAGNOSIS — R509 Fever, unspecified: Secondary | ICD-10-CM

## 2018-07-13 DIAGNOSIS — K769 Liver disease, unspecified: Secondary | ICD-10-CM

## 2018-07-13 DIAGNOSIS — R5381 Other malaise: Secondary | ICD-10-CM

## 2018-07-13 DIAGNOSIS — R5383 Other fatigue: Secondary | ICD-10-CM

## 2018-07-13 DIAGNOSIS — E7220 Disorder of urea cycle metabolism, unspecified: Secondary | ICD-10-CM

## 2018-07-13 DIAGNOSIS — G43909 Migraine, unspecified, not intractable, without status migrainosus: Secondary | ICD-10-CM | POA: Diagnosis present

## 2018-07-13 DIAGNOSIS — K869 Disease of pancreas, unspecified: Secondary | ICD-10-CM

## 2018-07-13 DIAGNOSIS — B169 Acute hepatitis B without delta-agent and without hepatic coma: Principal | ICD-10-CM

## 2018-07-13 LAB — CBC WITH DIFFERENTIAL/PLATELET
Band Neutrophils: 0 %
Basophils Absolute: 0.1 10*3/uL (ref 0.0–0.1)
Basophils Relative: 2 %
Blasts: 0 %
Eosinophils Absolute: 0.1 10*3/uL (ref 0.0–0.5)
Eosinophils Relative: 1 %
HCT: 37.8 % (ref 36.0–46.0)
Hemoglobin: 13.1 g/dL (ref 12.0–15.0)
Lymphocytes Relative: 24 %
Lymphs Abs: 1.5 10*3/uL (ref 0.7–4.0)
MCH: 28 pg (ref 26.0–34.0)
MCHC: 34.7 g/dL (ref 30.0–36.0)
MCV: 80.8 fL (ref 80.0–100.0)
Metamyelocytes Relative: 0 %
Monocytes Absolute: 0.2 10*3/uL (ref 0.1–1.0)
Monocytes Relative: 3 %
Myelocytes: 0 %
Neutro Abs: 4.2 10*3/uL (ref 1.7–7.7)
Neutrophils Relative %: 66 %
Other: 4 %
Platelets: 230 10*3/uL (ref 150–400)
Promyelocytes Relative: 0 %
RBC: 4.68 MIL/uL (ref 3.87–5.11)
RDW: 14 % (ref 11.5–15.5)
WBC: 6.3 10*3/uL (ref 4.0–10.5)
nRBC: 0 % (ref 0.0–0.2)
nRBC: 0 /100 WBC

## 2018-07-13 LAB — COMPREHENSIVE METABOLIC PANEL
ALT: 1547 U/L — ABNORMAL HIGH (ref 0–44)
AST: 1683 U/L — ABNORMAL HIGH (ref 15–41)
Albumin: 3.3 g/dL — ABNORMAL LOW (ref 3.5–5.0)
Alkaline Phosphatase: 187 U/L — ABNORMAL HIGH (ref 38–126)
Anion gap: 10 (ref 5–15)
BUN: 5 mg/dL — ABNORMAL LOW (ref 6–20)
CO2: 22 mmol/L (ref 22–32)
Calcium: 8.6 mg/dL — ABNORMAL LOW (ref 8.9–10.3)
Chloride: 103 mmol/L (ref 98–111)
Creatinine, Ser: 0.98 mg/dL (ref 0.44–1.00)
GFR calc Af Amer: >60 mL/min (ref >60)
GFR calc non Af Amer: >60 mL/min (ref >60)
Glucose, Bld: 135 mg/dL — ABNORMAL HIGH (ref 70–99)
Potassium: 3.5 mmol/L (ref 3.5–5.1)
Sodium: 135 mmol/L (ref 135–145)
Total Bilirubin: 2.5 mg/dL — ABNORMAL HIGH (ref 0.3–1.2)
Total Protein: 7.5 g/dL (ref 6.5–8.1)

## 2018-07-13 LAB — URINALYSIS, ROUTINE W REFLEX MICROSCOPIC
Bacteria, UA: NONE SEEN
Glucose, UA: NEGATIVE mg/dL
Hgb urine dipstick: NEGATIVE
Ketones, ur: NEGATIVE mg/dL
Leukocytes, UA: NEGATIVE
Nitrite: NEGATIVE
Protein, ur: 30 mg/dL — AB
Specific Gravity, Urine: 1.038 — ABNORMAL HIGH (ref 1.005–1.030)
pH: 5 (ref 5.0–8.0)

## 2018-07-13 LAB — PROTIME-INR
INR: 1.35
Prothrombin Time: 16.5 seconds — ABNORMAL HIGH (ref 11.4–15.2)

## 2018-07-13 LAB — LIPASE, BLOOD: Lipase: 22 U/L (ref 11–51)

## 2018-07-13 LAB — POC URINE PREG, ED: Preg Test, Ur: NEGATIVE

## 2018-07-13 LAB — AMMONIA: Ammonia: 69 umol/L — ABNORMAL HIGH (ref 9–35)

## 2018-07-13 MED ORDER — ONDANSETRON HCL 4 MG/2ML IJ SOLN
4.0000 mg | Freq: Four times a day (QID) | INTRAMUSCULAR | Status: DC | PRN
  Administered 2018-07-14 – 2018-07-17 (×3): 4 mg via INTRAVENOUS
  Filled 2018-07-13 (×3): qty 2

## 2018-07-13 MED ORDER — ONDANSETRON HCL 4 MG PO TABS: 4.0000 mg | ORAL_TABLET | Freq: Four times a day (QID) | ORAL | Status: DC | PRN

## 2018-07-13 MED ORDER — ENOXAPARIN SODIUM 40 MG/0.4ML ~~LOC~~ SOLN
40.0000 mg | SUBCUTANEOUS | Status: DC
  Administered 2018-07-14: 40 mg via SUBCUTANEOUS
  Filled 2018-07-13: qty 0.4

## 2018-07-13 MED ORDER — GADOBUTROL 1 MMOL/ML IV SOLN
9.5000 mL | Freq: Once | INTRAVENOUS | Status: AC | PRN
  Administered 2018-07-13: 9.5 mL via INTRAVENOUS

## 2018-07-13 MED ORDER — SODIUM CHLORIDE 0.9 % IV BOLUS
1000.0000 mL | Freq: Once | INTRAVENOUS | Status: AC
  Administered 2018-07-13: 1000 mL via INTRAVENOUS

## 2018-07-13 MED ORDER — SODIUM CHLORIDE 0.9 % IV SOLN
INTRAVENOUS | Status: AC
  Administered 2018-07-14 (×2): via INTRAVENOUS

## 2018-07-13 MED ORDER — GADOBUTROL 1 MMOL/ML IV SOLN: 9.5000 mL | Freq: Once | INTRAVENOUS | Status: DC | PRN

## 2018-07-13 MED ORDER — ACETAMINOPHEN 325 MG PO TABS
650.0000 mg | ORAL_TABLET | Freq: Once | ORAL | Status: AC
  Administered 2018-07-13: 650 mg via ORAL
  Filled 2018-07-13: qty 2

## 2018-07-13 NOTE — ED Provider Notes (Signed)
[facility] EMERGENCY DEPARTMENT Provider Note   CSN: [id number] Arrival date & time: 07/13/18  1534     History   Chief Complaint No chief complaint on file.   HPI Kathryn Stevenson is a 43 y.o. female.  The history is provided by the patient and medical records. No language interpreter was used.  Illness  Location:  Fatigue and malaise Severity:  Severe Onset quality:  Gradual Duration:  3 days Timing:  Constant Progression:  Waxing and waning Chronicity:  New Associated symptoms: fatigue, fever and shortness of breath   Associated symptoms: no abdominal pain, no chest pain, no congestion, no cough, no diarrhea, no headaches, no loss of consciousness, no rash, no rhinorrhea, no vomiting and no wheezing     Past Medical History:  Diagnosis Date  . Acid reflux   . Dermatitis   . Fracture, ankle   . Morbid obesity [facility])     Patient Active Problem List   Diagnosis Date Noted  . Right leg pain 03/29/2017  . RUQ abdominal pain 06/30/2016  . Liver lesion 06/30/2016  . Gastroesophageal reflux disease 06/30/2016  . Morbid obesity ([place]) 09/20/2015  . Severe obesity (BMI >= 40) ([place]) 09/20/2015  . Dermatitis 09/20/2015    Past Surgical History:  Procedure Laterality Date  . ANKLE SURGERY Left   . INDUCED ABORTION    . MOUTH SURGERY    . TUBAL LIGATION       OB History    Gravida  6   Para  2   Term  2   Preterm      AB  4   Living  2     SAB  1   TAB  3   Ectopic      Multiple      Live Births               Home Medications    Prior to Admission medications   Medication Sig Start Date End Date Taking? Authorizing Provider  buPROPion (WELLBUTRIN XL) 150 MG 24 hr tablet Take 1 tablet (150 mg total) by mouth daily. 07/08/18   [name], FNP  minocycline (MINOCIN) 100 MG capsule Take 1 capsule (100 mg total) by mouth 2 (two) times daily. 07/08/18   [name], FNP  naproxen (NAPROSYN) 500 MG tablet Take 1 tablet  (500 mg total) by mouth 2 (two) times daily. 05/28/18   Tereasa Coop, PA-C  omeprazole (PRILOSEC) 40 MG capsule Take 1 capsule (40 mg total) by mouth daily. 11/09/17   Dorena Dew, FNP  pantoprazole (PROTONIX) 40 MG tablet Take 1 tablet (40 mg total) by mouth daily. 07/08/18   [name], FNP    Family History Family History  Problem Relation Age of Onset  . Hypertension Mother   . Ovarian cancer Maternal Grandmother   . Heart attack Maternal Grandmother   . Esophageal cancer Neg Hx   . Colon cancer Neg Hx   . Pancreatic cancer Neg Hx   . Stomach cancer Neg Hx   . Liver disease Neg Hx     Social History Social History   Tobacco Use  . Smoking status: Former Smoker    Packs/day: 0.50    Types: Cigarettes  . Smokeless tobacco: Never Used  Substance Use Topics  . Alcohol use: No  . Drug use: No     Allergies   Patient has no known allergies.   Review of Systems Review of Systems  Constitutional:  Positive for chills, fatigue and fever. Negative for diaphoresis.  HENT: Negative for congestion and rhinorrhea.   Eyes: Negative for photophobia and visual disturbance.  Respiratory: Positive for shortness of breath. Negative for cough, chest tightness, wheezing and stridor.   Cardiovascular: Negative for chest pain, palpitations and leg swelling.  Gastrointestinal: Negative for abdominal pain, constipation, diarrhea and vomiting.  Genitourinary: Negative for dysuria, flank pain and frequency.  Musculoskeletal: Negative for back pain, neck pain and neck stiffness.  Skin: Negative for rash and wound.  Neurological: Negative for dizziness, loss of consciousness, light-headedness, numbness and headaches.  Psychiatric/Behavioral: Negative for agitation and confusion.  All other systems reviewed and are negative.    Physical Exam Updated Vital Signs BP 110/78   Pulse 86   Temp (!) 100.6 F (38.1 C) (Oral)   Resp 19   Ht '5\' 6"'  (1.676 m)   Wt (!) 151.5 kg   LMP  06/23/2018   SpO2 100%   BMI 53.91 kg/m   Physical Exam Vitals signs and nursing note reviewed.  Constitutional:      General: She is not in acute distress.    Appearance: She is well-developed. She is not ill-appearing, toxic-appearing or diaphoretic.  HENT:     Head: Normocephalic and atraumatic.     Nose: Nose normal. No congestion or rhinorrhea.     Mouth/Throat:     Mouth: Mucous membranes are moist.     Pharynx: No oropharyngeal exudate or posterior oropharyngeal erythema.  Eyes:     Extraocular Movements: Extraocular movements intact.     Conjunctiva/sclera: Conjunctivae normal.     Pupils: Pupils are equal, round, and reactive to light.  Neck:     Musculoskeletal: Neck supple. No neck rigidity or muscular tenderness.  Cardiovascular:     Rate and Rhythm: Normal rate and regular rhythm.     Heart sounds: No murmur.  Pulmonary:     Effort: Pulmonary effort is normal. No respiratory distress.     Breath sounds: Normal breath sounds. No wheezing, rhonchi or rales.  Chest:     Chest wall: No tenderness.  Abdominal:     General: Abdomen is flat.     Palpations: Abdomen is soft.     Tenderness: There is no abdominal tenderness.  Musculoskeletal:        General: No tenderness.     Right lower leg: No edema.     Left lower leg: No edema.  Skin:    General: Skin is warm and dry.     Capillary Refill: Capillary refill takes less than 2 seconds.  Neurological:     General: No focal deficit present.     Mental Status: She is alert.     Cranial Nerves: No cranial nerve deficit.     Sensory: No sensory deficit.     Motor: No weakness.  Psychiatric:        Mood and Affect: Mood normal.      ED Treatments / Results  Labs (all labs ordered are listed, but only abnormal results are displayed) Labs Reviewed  URINALYSIS, ROUTINE W REFLEX MICROSCOPIC - Abnormal; Notable for the following components:      Result Value   Color, Urine AMBER (*)    Specific Gravity, Urine  1.038 (*)    Bilirubin Urine MODERATE (*)    Protein, ur 30 (*)    All other components within normal limits  COMPREHENSIVE METABOLIC PANEL - Abnormal; Notable for the following components:   Glucose, Bld 135 (*)  BUN 5 (*)    Calcium 8.6 (*)    Albumin 3.3 (*)    AST 1,683 (*)    ALT 1,547 (*)    Alkaline Phosphatase 187 (*)    Total Bilirubin 2.5 (*)    All other components within normal limits  PROTIME-INR - Abnormal; Notable for the following components:   Prothrombin Time 16.5 (*)    All other components within normal limits  AMMONIA - Abnormal; Notable for the following components:   Ammonia 69 (*)    All other components within normal limits  URINE CULTURE  LIPASE, BLOOD  CBC WITH DIFFERENTIAL/PLATELET  HIV ANTIBODY (ROUTINE TESTING W REFLEX)  COMPREHENSIVE METABOLIC PANEL  CBC  HEPATITIS C ANTIBODY  HEPATITIS B CORE ANTIBODY, TOTAL  HEPATITIS B SURFACE ANTIBODY,QUALITATIVE  HEPATITIS B SURFACE ANTIGEN  POC URINE PREG, ED    EKG EKG Interpretation  Date/Time:  Saturday July 13 2018 15:49:14 EST Ventricular Rate:  81 PR Interval:    QRS Duration: 81 QT Interval:  343 QTC Calculation: 399 R Axis:   8 Text Interpretation:  Sinus rhythm Low voltage, precordial leads when compared to prior, no significant changes seen.  No STEMI Confirmed by Antony Blackbird [phone]) on 07/13/2018 6:00:56 PM   Radiology Dg Chest 2 View  Result Date: 07/13/2018 CLINICAL DATA:  Fever, chills, and shortness of breath for 4 days. EXAM: CHEST - 2 VIEW COMPARISON:  12/24/2016 FINDINGS: The heart size and mediastinal contours are within normal limits. Both lungs are clear. The visualized skeletal structures are unremarkable. IMPRESSION: Negative.  No active cardiopulmonary disease. Electronically Signed   By: Earle Gell M.D.   On: 07/13/2018 17:43   Mr Liver W Wo Contrast  Result Date: 07/13/2018 CLINICAL DATA:  Liver lesion enlargement, for definitive characterization. EXAM: MRI  ABDOMEN WITHOUT AND WITH CONTRAST TECHNIQUE: Multiplanar multisequence MR imaging of the abdomen was performed both before and after the administration of intravenous contrast. CONTRAST:  9.5 cc Gadavist COMPARISON:  Abdominal ultrasound 01/02/2018 FINDINGS: Lower chest: Mild cardiomegaly. Hepatobiliary: In segment 7 of the liver there is a 3.0 by 2.0 cm T2 hyperintense lesion with sharp marginal definition, high T2 signal, and peripheral nodular discontinuous centripetal enhancement diagnostic of hepatic hemangioma. Gallbladder unremarkable. The biliary tree appears normal. No additional significant hepatobiliary findings. Pancreas: There is a 3.2 by 2.0 by 2.3 cm mass within or along the uncinate process of the pancreas as shown on image 92/1101, which is hypoenhancing on arterial phase images, isoenhancing on portal venous phase images, and slightly hyperenhancing relative to the pancreas on the delayed phase images. The lesion does not appear to invade adjacent structures including the SMV. Spleen:  Unremarkable Adrenals/Urinary Tract:  Unremarkable Stomach/Bowel: Unremarkable Vascular/Lymphatic: 2.0 cm peripancreatic lymph node, image 75/1101. 1.8 cm in short axis portacaval lymph node on image 23/8. Additional smaller confluent porta hepatis and celiac nodes are present along with small right gastric nodes. Small retroperitoneal lymph nodes are also present. Other:  No supplemental non-categorized findings. Musculoskeletal: Unremarkable IMPRESSION: 1. Although the hepatic lesion of concern is shown to represent a benign hepatic hemangioma, there is a concerning lesion measuring 3.2 by 2.0 by 2.3 cm in the pancreas along the uncinate process which demonstrates arterial phase hypoenhancement, portal venous phase isoenhancement, and mild hyperenhancement on the delayed phase. There is also pathologically enlarged adenopathy in the peripancreatic/porta hepatis region. The lesion is sharply defined and the delayed  enhancement would be somewhat atypical for adenocarcinoma, but the constellation of findings indicates the  need for tissue diagnosis as malignancy of the pancreas is a distinct possibility. 2. Mild cardiomegaly. Electronically Signed   By: Van Clines M.D.   On: 07/13/2018 15:30    Procedures Procedures (including critical care time)  Medications Ordered in ED Medications  enoxaparin (LOVENOX) injection 40 mg (has no administration in time range)  0.9 %  sodium chloride infusion (has no administration in time range)  ondansetron (ZOFRAN) tablet 4 mg (has no administration in time range)    Or  ondansetron (ZOFRAN) injection 4 mg (has no administration in time range)  sodium chloride 0.9 % bolus 1,000 mL (0 mLs Intravenous Stopped 07/13/18 1805)  acetaminophen (TYLENOL) tablet 650 mg (650 mg Oral Given 07/13/18 1631)     Initial Impression / Assessment and Plan / ED Course  I have reviewed the triage vital signs and the nursing notes.  Pertinent labs & imaging results that were available during my care of the patient were reviewed by me and considered in my medical decision making (see chart for details).     KYMBER KOSAR is a 43 y.o. female with a past medical history significant for obesity, GERD, and chronic liver lesion currently being worked up by PCP who presents with 3 days of malaise, subjective fevers, chills, fatigue, and shortness of breath.  She denies significant URI symptoms of rhinorrhea congestion or cough.  She denies chest pain, palpitations, lightheadedness, or syncope.  She primarily reports fatigue and malaise.  She denies any urinary symptoms or GI symptoms.  No constipation, diarrhea.  Patient reports that she has had a liver lesion for years and she still able to take Tylenol and other liver metabolized medications.  She has had no known sick contacts.  She denies neck pain, neck stiffness, or headache.  She denies any other pains.  On exam, lungs are clear.   Chest is nontender.  Patient is febrile on arrival.  Abdomen is nontender.  Patient moving all extremities.  Normal pulses in extremities.  No significant leg swelling tenderness.  No rashes seen.  No rhinorrhea or congestion.  Oropharyngeal exam unremarkable.  Clinical aspect patient has a viral infection causing her malaise chills and fatigue.  Suspect mild amount of dehydration.  Patient given fluids and will be given Tylenol for her temperature.  She will have screen laboratory testing as well as a chest x-ray and urinalysis to rule out bacterial infection.  Anticipate discharge patient able to tolerate p.o. and is feeling better.  6:25 PM Laboratory testing began to return.  Patient's brain says was negative.  CBC reassuring.  Urinalysis shows no evidence infection with no ketones leukocytes or bacteria.  There was some bilirubin and protein in her urine however.  CMP was concerning for elevated AST, ALT and alk phos of 1600, 1501 87 respectively.  Bilirubin has increased to 2.5.  Patient reports that her labs were drawn at her gastroenterologist office earlier this week although she does not know what the results were.  Patient already received the dose of Tylenol after she reported she was able to take it before the labs returned.  We will hold on further occasions metabolized by the liver.  Patient will have INR and ammonia level checked to see if this is causing her malaise and fatigue.  Anticipate touching base with gastroenterology to determine disposition as outpatient MRI done today shows concern for possible pancreatic malignancy.  Patient's ammonia was elevated at 69 and INR was 1.3.  Spoke with Dr. home with  gastroenterology who was covering for lower GI tonight and discussed the case.  He feels that due to her mental status fatigue and malaise and this worsening liver function, he feels she needs admission for further monitoring of mental status, he agrees with lactulose, and he  will see her in the morning for further management.  He is concerned that she could be developing liver failure given this finding as well as the concerning MRI results of her pancreas.  Hospitalist will be called for admission.   Final Clinical Impressions(s) / ED Diagnoses   Final diagnoses:  Fatigue, unspecified type  Malaise and fatigue  Fever, unspecified fever cause  LFT elevation  Hyperammonemia [facility])    ED Discharge Orders    None     Clinical Impression: 1. Fatigue, unspecified type   2. Malaise and fatigue   3. Fever, unspecified fever cause   4. LFT elevation   5. Hyperammonemia (HCC)   6. Elevated LFTs     Disposition: Admit  This note was prepared with assistance of Dragon voice recognition software. Occasional wrong-word or sound-a-like substitutions may have occurred due to the inherent limitations of voice recognition software.     , Gwenyth Allegra, MD 07/14/18 [phone]

## 2018-07-13 NOTE — H&P (Signed)
History and Physical    Kathryn Stevenson FYB:[id number] DOB: 1975/12/19 DOA: 07/13/2018  PCP: [name], [place]  Patient coming from: Home  I have personally briefly reviewed patient's old medical records in [place]  Chief Complaint: Malaise, dyspnea  HPI: Kathryn Stevenson is a 43 y.o. female with medical history significant for obesity, GERD, and hepatic hemangioma who presents the ED with 3 days of generalized fatigue, malaise, subjective fevers, chills, dyspnea, intermittent lightheadedness, and nausea without emesis.  She reports having chronic liver lesion which is being followed by GI and has been benign appearing.  She felt as she was having a viral URI recently and took one Tylenol yesterday, but denies otherwise excessive Tylenol use. The only other medication she takes is as needed omeprazole.  She denies any abdominal pain, chest pain, dysuria.  Patient had follow-up MRI of the liver with and without contrast completed prior to ED arrival which showed the benign-appearing hepatic hemangioma in question however there was a concerning pancreatic lesion along the uncinate process with associated pathologic adenopathy in the peripancreatic/porta hepatis region.  The findings for atypical for adenocarcinoma however due to possibility of pancreatic malignancy tissue diagnosis was recommended.  ED Course:  Initial vitals showed BP 110/78, pulse 86, RR 19, temp 100.6 Fahrenheit, SPO2 100% on room air.  Lab work was notable for AST 1683, ALT 1547, alk phos 187, T bili 2.5, ammonia 69, PT 16.5, INR 1.35, lipase 22.  CBC was within normal limits.  2 view chest x-ray was negative for acute cardiopulmonary process.  On-call GI, Dr. Benson Norway, was consulted by EDP who recommended admission overnight due to patient's fatigue/malaise and worsening liver function.  The hospitalist service was consulted for admission.  Review of Systems: As per HPI otherwise 10 point review of systems  negative.    Past Medical History:  Diagnosis Date  . Acid reflux   . Dermatitis   . Fracture, ankle   . Liver lesion    noncancerous  . Morbid obesity ([place])     Past Surgical History:  Procedure Laterality Date  . ANKLE SURGERY Left   . INDUCED ABORTION    . MOUTH SURGERY    . TUBAL LIGATION       reports that she quit smoking about 19 months ago. Her smoking use included cigarettes. She smoked 0.50 packs per day. She has never used smokeless tobacco. She reports that she does not drink alcohol or use drugs.  No Known Allergies  Family History  Problem Relation Age of Onset  . Hypertension Mother   . Ovarian cancer Maternal Grandmother   . Heart attack Maternal Grandmother   . Esophageal cancer Neg Hx   . Colon cancer Neg Hx   . Pancreatic cancer Neg Hx   . Stomach cancer Neg Hx   . Liver disease Neg Hx      Prior to Admission medications   Medication Sig Start Date End Date Taking? Authorizing Provider  naproxen (NAPROSYN) 500 MG tablet Take 1 tablet (500 mg total) by mouth 2 (two) times daily. 05/28/18  Yes Tereasa Coop, PA-C  omeprazole (PRILOSEC) 40 MG capsule Take 1 capsule (40 mg total) by mouth daily. 11/09/17  Yes Dorena Dew, FNP  buPROPion (WELLBUTRIN XL) 150 MG 24 hr tablet Take 1 tablet (150 mg total) by mouth daily. 07/08/18   [name], FNP  minocycline (MINOCIN) 100 MG capsule Take 1 capsule (100 mg total) by mouth 2 (two) times daily. 07/08/18  [name], FNP  pantoprazole (PROTONIX) 40 MG tablet Take 1 tablet (40 mg total) by mouth daily. 07/08/18   [name], FNP    Physical Exam: Vitals:   07/13/18 1615 07/13/18 1617 07/13/18 2244 07/14/18 0015  BP: 106/64  (!) 99/49 114/67  Pulse: 81  67 66  Resp: (!) _0 Temp:   99 F (37.2 C) 99.9 F (37.7 C)  TempSrc:   Oral Oral  SpO2: 100%  99% 100%  Weight:  (!) 151.5 kg  (!) 150.8 kg  Height:  _1  (1.676 m)  _2  (1.676 m)    Constitutional: Obese woman resting  supine in bed, NAD, calm, comfortable Eyes: PERRL, lids and conjunctivae normal ENMT: Mucous membranes are moist. Posterior pharynx clear of any exudate or lesions. Normal dentition.  Neck: normal, supple, no masses. Respiratory: clear to auscultation bilaterally, no wheezing, no crackles. Normal respiratory effort. No accessory muscle use.  Cardiovascular: Regular rate and rhythm, no murmurs / rubs / gallops. No extremity edema. Abdomen: no tenderness, no masses palpated. Bowel sounds positive.  Musculoskeletal: no clubbing / cyanosis. No joint deformity upper and lower extremities. Good ROM, no contractures. Normal muscle tone.  Skin: no rashes, lesions, ulcers. No induration Neurologic: CN 2-12 grossly intact. Sensation intact, Strength 5/5 in all 4.  Psychiatric: Normal judgment and insight. Alert and oriented x 3. Normal mood.     Labs on Admission: I have personally reviewed following labs and imaging studies  CBC: Recent Labs  Lab 07/13/18 1630  WBC 6.3  NEUTROABS 4.2  HGB 13.1  HCT 37.8  MCV 80.8  PLT 370   Basic Metabolic Panel: Recent Labs  Lab 07/13/18 1630  NA 135  K 3.5  CL 103  CO2 22  GLUCOSE 135*  BUN 5*  CREATININE 0.98  CALCIUM 8.6*   GFR: Estimated Creatinine Clearance: 112.1 mL/min (by C-G formula based on SCr of 0.98 mg/dL). Liver Function Tests: Recent Labs  Lab 07/13/18 1630  AST 1,683*  ALT 1,547*  ALKPHOS 187*  BILITOT 2.5*  PROT 7.5  ALBUMIN 3.3*   Recent Labs  Lab 07/13/18 1630  LIPASE 22   Recent Labs  Lab 07/13/18 1928  AMMONIA 69*   Coagulation Profile: Recent Labs  Lab 07/13/18 1928  INR 1.35   Cardiac Enzymes: No results for input(s): CKTOTAL, CKMB, CKMBINDEX, TROPONINI in the last 168 hours. BNP (last 3 results) No results for input(s): PROBNP in the last 8760 hours. HbA1C: No results for input(s): HGBA1C in the last 72 hours. CBG: No results for input(s): GLUCAP in the last 168 hours. Lipid Profile: No  results for input(s): CHOL, HDL, LDLCALC, TRIG, CHOLHDL, LDLDIRECT in the last 72 hours. Thyroid Function Tests: No results for input(s): TSH, T4TOTAL, FREET4, T3FREE, THYROIDAB in the last 72 hours. Anemia Panel: No results for input(s): VITAMINB12, FOLATE, FERRITIN, TIBC, IRON, RETICCTPCT in the last 72 hours. Urine analysis:    Component Value Date/Time   COLORURINE AMBER (A) 07/13/2018 1630   APPEARANCEUR CLEAR 07/13/2018 1630   LABSPEC 1.038 (H) 07/13/2018 1630   PHURINE 5.0 07/13/2018 1630   GLUCOSEU NEGATIVE 07/13/2018 1630   HGBUR NEGATIVE 07/13/2018 1630   BILIRUBINUR MODERATE (A) 07/13/2018 1630   KETONESUR NEGATIVE 07/13/2018 1630   PROTEINUR 30 (A) 07/13/2018 1630   UROBILINOGEN 1.0 12/08/2016 1543   NITRITE NEGATIVE 07/13/2018 1630   LEUKOCYTESUR NEGATIVE 07/13/2018 1630    Radiological Exams on Admission: Dg Chest 2 View  Result Date: 07/13/2018  CLINICAL DATA:  Fever, chills, and shortness of breath for 4 days. EXAM: CHEST - 2 VIEW COMPARISON:  12/24/2016 FINDINGS: The heart size and mediastinal contours are within normal limits. Both lungs are clear. The visualized skeletal structures are unremarkable. IMPRESSION: Negative.  No active cardiopulmonary disease. Electronically Signed   By: Earle Gell M.D.   On: 07/13/2018 17:43   Mr Liver W Wo Contrast  Result Date: 07/13/2018 CLINICAL DATA:  Liver lesion enlargement, for definitive characterization. EXAM: MRI ABDOMEN WITHOUT AND WITH CONTRAST TECHNIQUE: Multiplanar multisequence MR imaging of the abdomen was performed both before and after the administration of intravenous contrast. CONTRAST:  9.5 cc Gadavist COMPARISON:  Abdominal ultrasound 01/02/2018 FINDINGS: Lower chest: Mild cardiomegaly. Hepatobiliary: In segment 7 of the liver there is a 3.0 by 2.0 cm T2 hyperintense lesion with sharp marginal definition, high T2 signal, and peripheral nodular discontinuous centripetal enhancement diagnostic of hepatic hemangioma.  Gallbladder unremarkable. The biliary tree appears normal. No additional significant hepatobiliary findings. Pancreas: There is a 3.2 by 2.0 by 2.3 cm mass within or along the uncinate process of the pancreas as shown on image 92/1101, which is hypoenhancing on arterial phase images, isoenhancing on portal venous phase images, and slightly hyperenhancing relative to the pancreas on the delayed phase images. The lesion does not appear to invade adjacent structures including the SMV. Spleen:  Unremarkable Adrenals/Urinary Tract:  Unremarkable Stomach/Bowel: Unremarkable Vascular/Lymphatic: 2.0 cm peripancreatic lymph node, image 75/1101. 1.8 cm in short axis portacaval lymph node on image 23/8. Additional smaller confluent porta hepatis and celiac nodes are present along with small right gastric nodes. Small retroperitoneal lymph nodes are also present. Other:  No supplemental non-categorized findings. Musculoskeletal: Unremarkable IMPRESSION: 1. Although the hepatic lesion of concern is shown to represent a benign hepatic hemangioma, there is a concerning lesion measuring 3.2 by 2.0 by 2.3 cm in the pancreas along the uncinate process which demonstrates arterial phase hypoenhancement, portal venous phase isoenhancement, and mild hyperenhancement on the delayed phase. There is also pathologically enlarged adenopathy in the peripancreatic/porta hepatis region. The lesion is sharply defined and the delayed enhancement would be somewhat atypical for adenocarcinoma, but the constellation of findings indicates the need for tissue diagnosis as malignancy of the pancreas is a distinct possibility. 2. Mild cardiomegaly. Electronically Signed   By: Van Clines M.D.   On: 07/13/2018 15:30    EKG: Independently reviewed.  Sinus rhythm.  Assessment/Plan Principal Problem:   Liver dysfunction Active Problems:   Pancreatic lesion  Kathryn Stevenson is a 43 y.o. female with medical history significant for obesity,  GERD, and hepatic hemangioma who presents the ED with 3 days of generalized fatigue, malaise, subjective fevers, chills, dyspnea, intermittent lightheadedness, and nausea without emesis admitted with significantly elevated LFTs.  Elevated LFTs: AST 1683, ALT 1547, alk phos 187, T bili 2.5.  Last LFTs on file were from July 2018 and were normal at that time.  There is concern for potential developing liver failure contributing to her presenting symptoms.  GI were consulted and will see in a.m. -Obtain RUQ ultrasound -Check hepatitis B and C serologies, HIV antibody -Appreciate follow-up GI consultation  Pancreatic lesion: Concerning pancreatic lesion with associated pathologic adenopathy was seen on follow-up MRI liver to evaluate known hepatic hemangioma.  I discussed the results with the patient with likely need for tissue biopsy. -Follow-up GI recommendations appreciated  DVT prophylaxis: Given subq Lovenox, will hold further in case of need for inpatient biopsy Code Status: Full code, confirmed  with patient Family Communication: Significant other at bedside Disposition Plan: Anticipate discharge to home pending further GI work-up Consults called: GI consulted by EDP, to see in a.m. Admission status: Observation   Zada Finders MD [facility] Pager [phone]  If 7PM-7AM, please contact night-coverage [email or web address]  07/14/2018, 1:42 AM

## 2018-07-14 ENCOUNTER — Other Ambulatory Visit: Payer: Self-pay

## 2018-07-14 ENCOUNTER — Observation Stay (HOSPITAL_COMMUNITY): Payer: Self-pay

## 2018-07-14 LAB — URINE CULTURE: Culture: <10000 — AB

## 2018-07-14 LAB — COMPREHENSIVE METABOLIC PANEL
ALT: 1640 U/L — ABNORMAL HIGH (ref 0–44)
AST: 1716 U/L — ABNORMAL HIGH (ref 15–41)
Albumin: 3 g/dL — ABNORMAL LOW (ref 3.5–5.0)
Alkaline Phosphatase: 183 U/L — ABNORMAL HIGH (ref 38–126)
Anion gap: 7 (ref 5–15)
BUN: <5 mg/dL — ABNORMAL LOW (ref 6–20)
CO2: 24 mmol/L (ref 22–32)
Calcium: 8.1 mg/dL — ABNORMAL LOW (ref 8.9–10.3)
Chloride: 104 mmol/L (ref 98–111)
Creatinine, Ser: 0.95 mg/dL (ref 0.44–1.00)
GFR calc Af Amer: >60 mL/min (ref >60)
GFR calc non Af Amer: >60 mL/min (ref >60)
Glucose, Bld: 109 mg/dL — ABNORMAL HIGH (ref 70–99)
Potassium: 3.8 mmol/L (ref 3.5–5.1)
Sodium: 135 mmol/L (ref 135–145)
Total Bilirubin: 2.6 mg/dL — ABNORMAL HIGH (ref 0.3–1.2)
Total Protein: 6.7 g/dL (ref 6.5–8.1)

## 2018-07-14 LAB — CBC
HCT: 35.9 % — ABNORMAL LOW (ref 36.0–46.0)
Hemoglobin: 12.5 g/dL (ref 12.0–15.0)
MCH: 27.6 pg (ref 26.0–34.0)
MCHC: 34.8 g/dL (ref 30.0–36.0)
MCV: 79.2 fL — ABNORMAL LOW (ref 80.0–100.0)
Platelets: 161 10*3/uL (ref 150–400)
RBC: 4.53 MIL/uL (ref 3.87–5.11)
RDW: 13.9 % (ref 11.5–15.5)
WBC: 6 10*3/uL (ref 4.0–10.5)
nRBC: 0 % (ref 0.0–0.2)

## 2018-07-14 LAB — HIV ANTIBODY (ROUTINE TESTING W REFLEX): HIV Screen 4th Generation wRfx: NONREACTIVE

## 2018-07-14 MED ORDER — OXYCODONE-ACETAMINOPHEN 5-325 MG PO TABS
1.0000 | ORAL_TABLET | Freq: Once | ORAL | Status: AC
  Administered 2018-07-14: 1 via ORAL
  Filled 2018-07-14: qty 1

## 2018-07-14 MED ORDER — ENOXAPARIN SODIUM 40 MG/0.4ML ~~LOC~~ SOLN
40.0000 mg | SUBCUTANEOUS | Status: DC
  Administered 2018-07-14 – 2018-07-17 (×4): 40 mg via SUBCUTANEOUS
  Filled 2018-07-14 (×4): qty 0.4

## 2018-07-14 NOTE — Progress Notes (Signed)
PROGRESS NOTE    Kathryn Stevenson  HYQ:[id number] DOB: 08/20/1975 DOA: 07/13/2018 PCP: [name], FNP  Outpatient Specialists:     Brief Narrative: BRANDILYNN Stevenson is a 43 y.o. female with medical history significant for obesity, GERD, and hepatic hemangioma who presents the ED with 3 days of generalized fatigue, malaise, subjective fevers, chills, dyspnea, intermittent lightheadedness, and nausea without emesis.  She reports having chronic liver lesion which is being followed by GI and has been benign appearing.  She felt as she was having a viral URI recently and took one Tylenol yesterday, but denies otherwise excessive Tylenol use. The only other medication she takes is as needed omeprazole.  She denies any abdominal pain, chest pain, dysuria.  Patient had follow-up MRI of the liver with and without contrast completed prior to ED arrival which showed the benign-appearing hepatic hemangioma in question however there was a concerning pancreatic lesion along the uncinate process with associated pathologic adenopathy in the peripancreatic/porta hepatis region.  The findings for atypical for adenocarcinoma however due to possibility of pancreatic malignancy tissue diagnosis was recommended.  07/14/2018: Patient seen.  GI input is appreciated.  Hepatitis A virus screening added to the wound work-up.  Continue to trend LFTs.  Follow pending work-up.   Assessment & Plan:   Principal Problem:   Liver dysfunction Active Problems:   Pancreatic lesion  Elevated LFTs/acute hepatitis: AST 1683, ALT 1547, alk phos 187, T bili 2.5.  Last LFTs on file were from July 2018 and were normal at that time.  There is concern for potential developing liver failure contributing to her presenting symptoms.  GI were consulted and will see in a.m. -Obtain RUQ ultrasound -Check hepatitis B and C serologies, HIV antibody -Appreciate follow-up GI consultation 07/14/2018: GI input is appreciated.  Hepatitis A  virus screening added.  Follow pending work-up and LFTs.  Pancreatic lesion: Concerning pancreatic lesion with associated pathologic adenopathy was seen on follow-up MRI liver to evaluate known hepatic hemangioma.  I discussed the results with the patient with likely need for tissue biopsy. -Follow-up GI recommendations appreciated 07/14/2018: Likely endoscopic ultrasound and biopsy when patient is more stable.  Morbid obesity:  Diet and exercise.  Possibly undiagnosed OSA/OHS:  Further work-up on an outpatient basis.  DVT prophylaxis:  Daily Lovenox Code Status: Full code, confirmed with patient Family Communication:  Daughter Disposition Plan: Home eventually  Consultants:   GI  Procedures:   None  Antimicrobials:   None   Subjective: No new complaints Less fatigue and tired today.  Objective: Vitals:   07/13/18 1617 07/13/18 2244 07/14/18 0015 07/14/18 0459  BP:  (!) 99/49 114/67 114/66  Pulse:  67 66 66  Resp:  '16 17 19  ' Temp:  99 F (37.2 C) 99.9 F (37.7 C) 99.1 F (37.3 C)  TempSrc:  Oral Oral Oral  SpO2:  99% 100% 100%  Weight: (!) 151.5 kg  (!) 150.8 kg   Height: '5\' 6"'  (1.676 m)  '5\' 6"'  (1.676 m)     Intake/Output Summary (Last 24 hours) at 07/14/2018 1103 Last data filed at 07/14/2018 0500 Gross per 24 hour  Intake 421.26 ml  Output -  Net 421.26 ml   Filed Weights   07/13/18 1617 07/14/18 0015  Weight: (!) 151.5 kg (!) 150.8 kg    Examination:  General exam: Appears calm and comfortable.  Obese. Respiratory system: Clear to auscultation. Respiratory effort normal. Cardiovascular system: S1 & S2 heard. Gastrointestinal system: Abdomen is obese, soft and  nontender.  Organs are difficult to assess.   Central nervous system: Alert and oriented. No focal neurological deficits. Extremities: As of the ankle  Data Reviewed: I have personally reviewed following labs and imaging studies  CBC: Recent Labs  Lab 07/13/18 1630 07/14/18 0050    WBC 6.3 6.0  NEUTROABS 4.2  --   HGB 13.1 12.5  HCT 37.8 35.9*  MCV 80.8 79.2*  PLT 230 161   Basic Metabolic Panel: Recent Labs  Lab 07/13/18 1630 07/14/18 0050  NA 135 135  K 3.5 3.8  CL 103 104  CO2 22 24  GLUCOSE 135* 109*  BUN 5* <5*  CREATININE 0.98 0.95  CALCIUM 8.6* 8.1*   GFR: Estimated Creatinine Clearance: 115.6 mL/min (by C-G formula based on SCr of 0.95 mg/dL). Liver Function Tests: Recent Labs  Lab 07/13/18 1630 07/14/18 0050  AST 1,683* 1,716*  ALT 1,547* 1,640*  ALKPHOS 187* 183*  BILITOT 2.5* 2.6*  PROT 7.5 6.7  ALBUMIN 3.3* 3.0*   Recent Labs  Lab 07/13/18 1630  LIPASE 22   Recent Labs  Lab 07/13/18 1928  AMMONIA 69*   Coagulation Profile: Recent Labs  Lab 07/13/18 1928  INR 1.35   Cardiac Enzymes: No results for input(s): CKTOTAL, CKMB, CKMBINDEX, TROPONINI in the last 168 hours. BNP (last 3 results) No results for input(s): PROBNP in the last 8760 hours. HbA1C: No results for input(s): HGBA1C in the last 72 hours. CBG: No results for input(s): GLUCAP in the last 168 hours. Lipid Profile: No results for input(s): CHOL, HDL, LDLCALC, TRIG, CHOLHDL, LDLDIRECT in the last 72 hours. Thyroid Function Tests: No results for input(s): TSH, T4TOTAL, FREET4, T3FREE, THYROIDAB in the last 72 hours. Anemia Panel: No results for input(s): VITAMINB12, FOLATE, FERRITIN, TIBC, IRON, RETICCTPCT in the last 72 hours. Urine analysis:    Component Value Date/Time   COLORURINE AMBER (A) 07/13/2018 1630   APPEARANCEUR CLEAR 07/13/2018 1630   LABSPEC 1.038 (H) 07/13/2018 1630   PHURINE 5.0 07/13/2018 1630   GLUCOSEU NEGATIVE 07/13/2018 1630   HGBUR NEGATIVE 07/13/2018 1630   BILIRUBINUR MODERATE (A) 07/13/2018 1630   KETONESUR NEGATIVE 07/13/2018 1630   PROTEINUR 30 (A) 07/13/2018 1630   UROBILINOGEN 1.0 12/08/2016 1543   NITRITE NEGATIVE 07/13/2018 1630   LEUKOCYTESUR NEGATIVE 07/13/2018 1630   Sepsis  Labs: '@LABRCNTIP' (procalcitonin:4,lacticidven:4)  )No results found for this or any previous visit (from the past 240 hour(s)).       Radiology Studies: Dg Chest 2 View  Result Date: 07/13/2018 CLINICAL DATA:  Fever, chills, and shortness of breath for 4 days. EXAM: CHEST - 2 VIEW COMPARISON:  12/24/2016 FINDINGS: The heart size and mediastinal contours are within normal limits. Both lungs are clear. The visualized skeletal structures are unremarkable. IMPRESSION: Negative.  No active cardiopulmonary disease. Electronically Signed   By: Earle Gell M.D.   On: 07/13/2018 17:43   Mr Liver W Wo Contrast  Result Date: 07/13/2018 CLINICAL DATA:  Liver lesion enlargement, for definitive characterization. EXAM: MRI ABDOMEN WITHOUT AND WITH CONTRAST TECHNIQUE: Multiplanar multisequence MR imaging of the abdomen was performed both before and after the administration of intravenous contrast. CONTRAST:  9.5 cc Gadavist COMPARISON:  Abdominal ultrasound 01/02/2018 FINDINGS: Lower chest: Mild cardiomegaly. Hepatobiliary: In segment 7 of the liver there is a 3.0 by 2.0 cm T2 hyperintense lesion with sharp marginal definition, high T2 signal, and peripheral nodular discontinuous centripetal enhancement diagnostic of hepatic hemangioma. Gallbladder unremarkable. The biliary tree appears normal. No additional significant hepatobiliary findings.  Pancreas: There is a 3.2 by 2.0 by 2.3 cm mass within or along the uncinate process of the pancreas as shown on image 92/1101, which is hypoenhancing on arterial phase images, isoenhancing on portal venous phase images, and slightly hyperenhancing relative to the pancreas on the delayed phase images. The lesion does not appear to invade adjacent structures including the SMV. Spleen:  Unremarkable Adrenals/Urinary Tract:  Unremarkable Stomach/Bowel: Unremarkable Vascular/Lymphatic: 2.0 cm peripancreatic lymph node, image 75/1101. 1.8 cm in short axis portacaval lymph node on  image 23/8. Additional smaller confluent porta hepatis and celiac nodes are present along with small right gastric nodes. Small retroperitoneal lymph nodes are also present. Other:  No supplemental non-categorized findings. Musculoskeletal: Unremarkable IMPRESSION: 1. Although the hepatic lesion of concern is shown to represent a benign hepatic hemangioma, there is a concerning lesion measuring 3.2 by 2.0 by 2.3 cm in the pancreas along the uncinate process which demonstrates arterial phase hypoenhancement, portal venous phase isoenhancement, and mild hyperenhancement on the delayed phase. There is also pathologically enlarged adenopathy in the peripancreatic/porta hepatis region. The lesion is sharply defined and the delayed enhancement would be somewhat atypical for adenocarcinoma, but the constellation of findings indicates the need for tissue diagnosis as malignancy of the pancreas is a distinct possibility. 2. Mild cardiomegaly. Electronically Signed   By: Van Clines M.D.   On: 07/13/2018 15:30   US Abdomen Limited Ruq  Result Date: 07/14/2018 CLINICAL DATA:  Elevated LFTs. EXAM: ULTRASOUND ABDOMEN LIMITED RIGHT UPPER QUADRANT COMPARISON:  Abdominal MRI/MRCP yesterday. FINDINGS: Gallbladder: Only minimally distended, similar to prior MRCP. No gallstones or wall thickening visualized. No sonographic Murphy sign noted by sonographer. Common bile duct: Diameter: 3 mm, normal. Liver: Hemangioma in the posterior right lobe of the liver on prior exam is not well demonstrated sonographically. Within normal limits in parenchymal echogenicity. Portal vein is patent on color Doppler imaging with normal direction of blood flow towards the liver. IMPRESSION: 1. No explanation for elevated LFTs.  No biliary dilatation. 2. Known hepatic hemangioma in the right lobe is not well visualized on the current exam. Electronically Signed   By: Keith Rake M.D.   On: 07/14/2018 01:41        Scheduled  Meds: Continuous Infusions: . sodium chloride 100 mL/hr at 07/14/18 0500     LOS: 0 days    Time spent: 25 minutes    Dana Allan, MD  [facility] Pager #: [phone] 7PM-7AM contact night coverage as above

## 2018-07-14 NOTE — Consult Note (Signed)
CONSULT FOR [facility]  Reason for Consult: Elevated liver enzymes, ? Pancreatic mass Referring Physician: [place] HPI: This is a 43 year old female admitted for weakness.  She states that her symptom started this past Wednesday.  At that time she experienced some chills, but she felt well enough to go to work.  On Pickup she felt very weak and she reported a fever to 100 F, which was associated with chills.  A liver MRI was ordered by [facility] for follow up of a liver lesion.  The hemangioma is stable, but a new arterial phase hypoenhancing lesion measuring 3x2x2 cm was note in the uncinate process of the pancreas.  There was also the finding of periportal LAD.  Her liver enzymes on admission were as follows:  AST 1683, ALT 1547, and AP 187.  Her INR was 1.35.  The patient denies any overt sick contacts and she does not take any herbal supplements or abuse drugs.  She does drink a special type of tea intermittently that she purchases from [employer].  Her HBV and HCV serologies are pending, but it does not appear that HAV was ordered.  Past Medical History:  Diagnosis Date  . Acid reflux   . Dermatitis   . Fracture, ankle   . Liver lesion    noncancerous  . Morbid obesity ([place] de [place]ierrez)     Past Surgical History:  Procedure Laterality Date  . ANKLE SURGERY Left   . INDUCED ABORTION    . MOUTH SURGERY    . TUBAL LIGATION      Family History  Problem Relation Age of Onset  . Hypertension Mother   . Ovarian cancer Maternal Grandmother   . Heart attack Maternal Grandmother   . Esophageal cancer Neg Hx   . Colon cancer Neg Hx   . Pancreatic cancer Neg Hx   . Stomach cancer Neg Hx   . Liver disease Neg Hx     Social History:  reports that she quit smoking about 19 months ago. Her smoking use included cigarettes. She smoked 0.50 packs per day. She has never used smokeless tobacco. She reports that she does not drink alcohol or use drugs.  Allergies: No Known  Allergies  Medications:  Scheduled:  Continuous: . sodium chloride 100 mL/hr at 07/14/18 0500    Results for orders placed or performed during the hospital encounter of 07/13/18 (from the past 24 hour(s))  Urinalysis, Routine w reflex microscopic     Status: Abnormal   Collection Time: 07/13/18  4:30 PM  Result Value Ref Range   Color, Urine AMBER (A) YELLOW   APPearance CLEAR CLEAR   Specific Gravity, Urine 1.038 (H) 1.005 - 1.030   pH 5.0 5.0 - 8.0   Glucose, UA NEGATIVE NEGATIVE mg/dL   Hgb urine dipstick NEGATIVE NEGATIVE   Bilirubin Urine MODERATE (A) NEGATIVE   Ketones, ur NEGATIVE NEGATIVE mg/dL   Protein, ur 30 (A) NEGATIVE mg/dL   Nitrite NEGATIVE NEGATIVE   Leukocytes, UA NEGATIVE NEGATIVE   RBC / HPF 0-5 0 - 5 RBC/hpf   WBC, UA 0-5 0 - 5 WBC/hpf   Bacteria, UA NONE SEEN NONE SEEN   Squamous Epithelial / LPF 0-5 0 - 5   Mucus PRESENT   Comprehensive metabolic panel     Status: Abnormal   Collection Time: 07/13/18  4:30 PM  Result Value Ref Range   Sodium 135 135 - 145 mmol/L   Potassium 3.5 3.5 - 5.1  mmol/L   Chloride 103 98 - 111 mmol/L   CO2 22 22 - 32 mmol/L   Glucose, Bld 135 (H) 70 - 99 mg/dL   BUN 5 (L) 6 - 20 mg/dL   Creatinine, Ser 0.98 0.44 - 1.00 mg/dL   Calcium 8.6 (L) 8.9 - 10.3 mg/dL   Total Protein 7.5 6.5 - 8.1 g/dL   Albumin 3.3 (L) 3.5 - 5.0 g/dL   AST 1,683 (H) 15 - 41 U/L   ALT 1,547 (H) 0 - 44 U/L   Alkaline Phosphatase 187 (H) 38 - 126 U/L   Total Bilirubin 2.5 (H) 0.3 - 1.2 mg/dL   GFR calc non Af Amer >60 >60 mL/min   GFR calc Af Amer >60 >60 mL/min   Anion gap 10 5 - 15  Lipase, blood     Status: None   Collection Time: 07/13/18  4:30 PM  Result Value Ref Range   Lipase 22 11 - 51 U/L  CBC with Differential     Status: None   Collection Time: 07/13/18  4:30 PM  Result Value Ref Range   WBC 6.3 4.0 - 10.5 K/uL   RBC 4.68 3.87 - 5.11 MIL/uL   Hemoglobin 13.1 12.0 - 15.0 g/dL   HCT 37.8 36.0 - 46.0 %   MCV 80.8 80.0 - 100.0 fL    MCH 28.0 26.0 - 34.0 pg   MCHC 34.7 30.0 - 36.0 g/dL   RDW 14.0 11.5 - 15.5 %   Platelets 230 150 - 400 K/uL   nRBC 0.0 0.0 - 0.2 %   Neutrophils Relative % 66 %   Lymphocytes Relative 24 %   Monocytes Relative 3 %   Eosinophils Relative 1 %   Basophils Relative 2 %   Band Neutrophils 0 %   Metamyelocytes Relative 0 %   Myelocytes 0 %   Promyelocytes Relative 0 %   Blasts 0 %   nRBC 0 0 /100 WBC   Other 4 %   Neutro Abs 4.2 1.7 - 7.7 K/uL   Lymphs Abs 1.5 0.7 - 4.0 K/uL   Monocytes Absolute 0.2 0.1 - 1.0 K/uL   Eosinophils Absolute 0.1 0.0 - 0.5 K/uL   Basophils Absolute 0.1 0.0 - 0.1 K/uL   RBC Morphology POLYCHROMASIA PRESENT    WBC Morphology VACUOLATED NEUTROPHILS   POC Urine Pregnancy, ED (do NOT order at [facility])     Status: None   Collection Time: 07/13/18  4:36 PM  Result Value Ref Range   Preg Test, Ur NEGATIVE NEGATIVE  Protime-INR     Status: Abnormal   Collection Time: 07/13/18  7:28 PM  Result Value Ref Range   Prothrombin Time 16.5 (H) 11.4 - 15.2 seconds   INR 1.35   Ammonia     Status: Abnormal   Collection Time: 07/13/18  7:28 PM  Result Value Ref Range   Ammonia 69 (H) 9 - 35 umol/L  Comprehensive metabolic panel     Status: Abnormal   Collection Time: 07/14/18 12:50 AM  Result Value Ref Range   Sodium 135 135 - 145 mmol/L   Potassium 3.8 3.5 - 5.1 mmol/L   Chloride 104 98 - 111 mmol/L   CO2 24 22 - 32 mmol/L   Glucose, Bld 109 (H) 70 - 99 mg/dL   BUN <5 (L) 6 - 20 mg/dL   Creatinine, Ser 0.95 0.44 - 1.00 mg/dL   Calcium 8.1 (L) 8.9 - 10.3 mg/dL   Total Protein 6.7 6.5 -  8.1 g/dL   Albumin 3.0 (L) 3.5 - 5.0 g/dL   AST 1,716 (H) 15 - 41 U/L   ALT 1,640 (H) 0 - 44 U/L   Alkaline Phosphatase 183 (H) 38 - 126 U/L   Total Bilirubin 2.6 (H) 0.3 - 1.2 mg/dL   GFR calc non Af Amer >60 >60 mL/min   GFR calc Af Amer >60 >60 mL/min   Anion gap 7 5 - 15  CBC     Status: Abnormal   Collection Time: 07/14/18 12:50 AM  Result Value Ref Range   WBC 6.0 4.0  - 10.5 K/uL   RBC 4.53 3.87 - 5.11 MIL/uL   Hemoglobin 12.5 12.0 - 15.0 g/dL   HCT 35.9 (L) 36.0 - 46.0 %   MCV 79.2 (L) 80.0 - 100.0 fL   MCH 27.6 26.0 - 34.0 pg   MCHC 34.8 30.0 - 36.0 g/dL   RDW 13.9 11.5 - 15.5 %   Platelets 161 150 - 400 K/uL   nRBC 0.0 0.0 - 0.2 %     Dg Chest 2 View  Result Date: 07/13/2018 CLINICAL DATA:  Fever, chills, and shortness of breath for 4 days. EXAM: CHEST - 2 VIEW COMPARISON:  12/24/2016 FINDINGS: The heart size and mediastinal contours are within normal limits. Both lungs are clear. The visualized skeletal structures are unremarkable. IMPRESSION: Negative.  No active cardiopulmonary disease. Electronically Signed   By: Earle Gell M.D.   On: 07/13/2018 17:43   Mr Liver W Wo Contrast  Result Date: 07/13/2018 CLINICAL DATA:  Liver lesion enlargement, for definitive characterization. EXAM: MRI ABDOMEN WITHOUT AND WITH CONTRAST TECHNIQUE: Multiplanar multisequence MR imaging of the abdomen was performed both before and after the administration of intravenous contrast. CONTRAST:  9.5 cc Gadavist COMPARISON:  Abdominal ultrasound 01/02/2018 FINDINGS: Lower chest: Mild cardiomegaly. Hepatobiliary: In segment 7 of the liver there is a 3.0 by 2.0 cm T2 hyperintense lesion with sharp marginal definition, high T2 signal, and peripheral nodular discontinuous centripetal enhancement diagnostic of hepatic hemangioma. Gallbladder unremarkable. The biliary tree appears normal. No additional significant hepatobiliary findings. Pancreas: There is a 3.2 by 2.0 by 2.3 cm mass within or along the uncinate process of the pancreas as shown on image 92/1101, which is hypoenhancing on arterial phase images, isoenhancing on portal venous phase images, and slightly hyperenhancing relative to the pancreas on the delayed phase images. The lesion does not appear to invade adjacent structures including the SMV. Spleen:  Unremarkable Adrenals/Urinary Tract:  Unremarkable Stomach/Bowel:  Unremarkable Vascular/Lymphatic: 2.0 cm peripancreatic lymph node, image 75/1101. 1.8 cm in short axis portacaval lymph node on image 23/8. Additional smaller confluent porta hepatis and celiac nodes are present along with small right gastric nodes. Small retroperitoneal lymph nodes are also present. Other:  No supplemental non-categorized findings. Musculoskeletal: Unremarkable IMPRESSION: 1. Although the hepatic lesion of concern is shown to represent a benign hepatic hemangioma, there is a concerning lesion measuring 3.2 by 2.0 by 2.3 cm in the pancreas along the uncinate process which demonstrates arterial phase hypoenhancement, portal venous phase isoenhancement, and mild hyperenhancement on the delayed phase. There is also pathologically enlarged adenopathy in the peripancreatic/porta hepatis region. The lesion is sharply defined and the delayed enhancement would be somewhat atypical for adenocarcinoma, but the constellation of findings indicates the need for tissue diagnosis as malignancy of the pancreas is a distinct possibility. 2. Mild cardiomegaly. Electronically Signed   By: Van Clines M.D.   On: 07/13/2018 15:30   US Abdomen  Limited Ruq  Result Date: 07/14/2018 CLINICAL DATA:  Elevated LFTs. EXAM: ULTRASOUND ABDOMEN LIMITED RIGHT UPPER QUADRANT COMPARISON:  Abdominal MRI/MRCP yesterday. FINDINGS: Gallbladder: Only minimally distended, similar to prior MRCP. No gallstones or wall thickening visualized. No sonographic Murphy sign noted by sonographer. Common bile duct: Diameter: 3 mm, normal. Liver: Hemangioma in the posterior right lobe of the liver on prior exam is not well demonstrated sonographically. Within normal limits in parenchymal echogenicity. Portal vein is patent on color Doppler imaging with normal direction of blood flow towards the liver. IMPRESSION: 1. No explanation for elevated LFTs.  No biliary dilatation. 2. Known hepatic hemangioma in the right lobe is not well visualized  on the current exam. Electronically Signed   By: Keith Rake M.D.   On: 07/14/2018 01:41    ROS:  As stated above in the HPI otherwise negative.  Blood pressure 114/66, pulse 66, temperature 99.1 F (37.3 C), temperature source Oral, resp. rate 19, height 5\' 6"  (1.676 m), weight (!) 150.8 kg, last menstrual period 06/23/2018, SpO2 100 %.    PE: Gen: NAD, Alert and Oriented HEENT:  [place]/AT, EOMI Neck: Supple, no LAD Lungs: CTA Bilaterally CV: RRR without M/G/R ABM: Soft, NTND, +BS, obese Ext: No C/C/E  Assessment/Plan: 1) Abnormal liver enzymes. 2) New pancreatic lesion of ? Etiology. 3) Fatigue. 4) Fever.   There is the possibility that she has HAV.  She does not have a drug history and there is not toxic exposure.  Rarely can HSV, EBV, CMV and AIH cause an acute hepatitis, but these may need to be worked up if she is negative for HAV.  She denies any recent diarrheal history with her current complaints or any problems with a sore throat.  HCV does not typically cause an acute hepatitis.  As for the new pancreatic lesion, this will need to be evaluated with EUS/FNA in the near future.  Plan: 1) Check HAV 2) Monitor liver enzymes. 3) Monitor for any evidence of developing encephalopathy. 4) Continue supportive care.   HUNG,PATRICK D 07/14/2018, 8:51 AM

## 2018-07-14 NOTE — Plan of Care (Signed)

## 2018-07-15 LAB — CBC WITH DIFFERENTIAL/PLATELET
Abs Immature Granulocytes: 0.03 10*3/uL (ref 0.00–0.07)
Basophils Absolute: 0.1 10*3/uL (ref 0.0–0.1)
Basophils Relative: 1 %
Eosinophils Absolute: 0.1 10*3/uL (ref 0.0–0.5)
Eosinophils Relative: 2 %
HCT: 35.7 % — ABNORMAL LOW (ref 36.0–46.0)
Hemoglobin: 12.5 g/dL (ref 12.0–15.0)
Immature Granulocytes: 0 %
Lymphocytes Relative: 41 %
Lymphs Abs: 2.8 10*3/uL (ref 0.7–4.0)
MCH: 27.5 pg (ref 26.0–34.0)
MCHC: 35 g/dL (ref 30.0–36.0)
MCV: 78.5 fL — ABNORMAL LOW (ref 80.0–100.0)
Monocytes Absolute: 0.7 10*3/uL (ref 0.1–1.0)
Monocytes Relative: 11 %
Neutro Abs: 3 10*3/uL (ref 1.7–7.7)
Neutrophils Relative %: 45 %
Platelets: 185 10*3/uL (ref 150–400)
RBC: 4.55 MIL/uL (ref 3.87–5.11)
RDW: 13.6 % (ref 11.5–15.5)
WBC: 6.7 10*3/uL (ref 4.0–10.5)
nRBC: 0 % (ref 0.0–0.2)

## 2018-07-15 LAB — COMPREHENSIVE METABOLIC PANEL
ALT: 2202 U/L — ABNORMAL HIGH (ref 0–44)
AST: 2322 U/L — ABNORMAL HIGH (ref 15–41)
Albumin: 2.9 g/dL — ABNORMAL LOW (ref 3.5–5.0)
Alkaline Phosphatase: 178 U/L — ABNORMAL HIGH (ref 38–126)
Anion gap: 9 (ref 5–15)
BUN: <5 mg/dL — ABNORMAL LOW (ref 6–20)
CO2: 19 mmol/L — ABNORMAL LOW (ref 22–32)
Calcium: 8.1 mg/dL — ABNORMAL LOW (ref 8.9–10.3)
Chloride: 105 mmol/L (ref 98–111)
Creatinine, Ser: 0.82 mg/dL (ref 0.44–1.00)
GFR calc Af Amer: >60 mL/min (ref >60)
GFR calc non Af Amer: >60 mL/min (ref >60)
Glucose, Bld: 94 mg/dL (ref 70–99)
Potassium: 3.9 mmol/L (ref 3.5–5.1)
Sodium: 133 mmol/L — ABNORMAL LOW (ref 135–145)
Total Bilirubin: 3.8 mg/dL — ABNORMAL HIGH (ref 0.3–1.2)
Total Protein: 6.7 g/dL (ref 6.5–8.1)

## 2018-07-15 LAB — HEPATITIS C ANTIBODY: HCV Ab: 0.2 s/co ratio (ref 0.0–0.9)

## 2018-07-15 LAB — PROTIME-INR
INR: 1.45
Prothrombin Time: 17.5 seconds — ABNORMAL HIGH (ref 11.4–15.2)

## 2018-07-15 LAB — HEPATITIS A ANTIBODY, IGM: Hep A IgM: NEGATIVE

## 2018-07-15 LAB — HEPATITIS B SURFACE ANTIBODY,QUALITATIVE: Hep B S Ab: NONREACTIVE

## 2018-07-15 LAB — HEPATITIS B CORE ANTIBODY, TOTAL: Hep B Core Total Ab: POSITIVE — AB

## 2018-07-15 MED ORDER — SUMATRIPTAN 20 MG/ACT NA SOLN
20.0000 mg | Freq: Once | NASAL | Status: DC
  Filled 2018-07-15: qty 1

## 2018-07-15 MED ORDER — SUMATRIPTAN SUCCINATE 6 MG/0.5ML ~~LOC~~ SOLN
6.0000 mg | SUBCUTANEOUS | Status: DC | PRN
  Administered 2018-07-15: 6 mg via SUBCUTANEOUS
  Filled 2018-07-15 (×3): qty 0.5

## 2018-07-15 MED ORDER — SUMATRIPTAN SUCCINATE 25 MG PO TABS
25.0000 mg | ORAL_TABLET | ORAL | Status: DC | PRN
  Filled 2018-07-15: qty 1

## 2018-07-15 MED ORDER — SUMATRIPTAN SUCCINATE 6 MG/0.5ML ~~LOC~~ SOLN: 6.0000 mg | Freq: Three times a day (TID) | SUBCUTANEOUS | Status: DC

## 2018-07-15 NOTE — Progress Notes (Signed)
PROGRESS NOTE    Kathryn Stevenson  WTU:[id number] DOB: [birth year] DOA: 07/13/2018 PCP: [name], FNP  Outpatient Specialists:     Brief Narrative: Kathryn Stevenson is a 43 y.o. female with medical history significant for obesity, GERD, and hepatic hemangioma who presents the ED with 3 days of generalized fatigue, malaise, subjective fevers, chills, dyspnea, intermittent lightheadedness, and nausea without emesis.  She reports having chronic liver lesion which is being followed by GI and has been benign appearing.  She felt as she was having a viral URI recently and took one Tylenol yesterday, but denies otherwise excessive Tylenol use. The only other medication she takes is as needed omeprazole.  She denies any abdominal pain, chest pain, dysuria.  Patient had follow-up MRI of the liver with and without contrast completed prior to ED arrival which showed the benign-appearing hepatic hemangioma in question however there was a concerning pancreatic lesion along the uncinate process with associated pathologic adenopathy in the peripancreatic/porta hepatis region.  The findings for atypical for adenocarcinoma however due to possibility of pancreatic malignancy tissue diagnosis was recommended.  07/14/2018: Patient seen.  GI input is appreciated.  Hepatitis A virus screening added to the wound work-up.  Continue to trend LFTs.  Follow pending work-up.  07/15/2018: Worsening ALT and AST.  GI team is directing care.  Patient reported migraine-like headache.  Will treat patient with Imitrex.  Further management depend on hospital course.  For EUS M biopsy of the pancreatic lesion on Wednesday as per GI notes, will possibly ERCP.   Assessment & Plan:   Principal Problem:   Liver dysfunction Active Problems:   Pancreatic lesion  Elevated LFTs/acute hepatitis: AST 1683, ALT 1547, alk phos 187, T bili 2.5.  Last LFTs on file were from July 2018 and were normal at that time.  There is concern for  potential developing liver failure contributing to her presenting symptoms.  GI were consulted and will see in a.m. -Obtain RUQ ultrasound -Check hepatitis B and C serologies, HIV antibody -Appreciate follow-up GI consultation 07/14/2018: GI input is appreciated.  Hepatitis A virus screening added.  Follow pending work-up and LFTs. 07/15/2018: Worsening ALT and AST.  GI team is directing care.  Patient reported migraine-like headache.  Will treat patient with Imitrex.  Further management depend on hospital course.  For EUS M biopsy of the pancreatic lesion on Wednesday as per GI notes, will possibly ERCP.   Pancreatic lesion: Concerning pancreatic lesion with associated pathologic adenopathy was seen on follow-up MRI liver to evaluate known hepatic hemangioma.  I discussed the results with the patient with likely need for tissue biopsy. -Follow-up GI recommendations appreciated 07/14/2018: Likely endoscopic ultrasound and biopsy when patient is more stable.  Morbid obesity:  Diet and exercise.  Possibly undiagnosed OSA/OHS:  Further work-up on an outpatient basis.  Migraine-like headache: Subcu Imitrex x2 doses. Start as needed oral Imitrex from tomorrow.  DVT prophylaxis:  Daily Lovenox Code Status: Full code, confirmed with patient Family Communication:  Daughter Disposition Plan: Home eventually  Consultants:   GI  Procedures:   None  Antimicrobials:   None   Subjective: Reports migraine-like headache.  Objective: Vitals:   07/14/18 2025 07/14/18 2156 07/15/18 0427 07/15/18 1336  BP:  106/66 (!) 103/59 104/62  Pulse:  63 66 80  Resp:  '17 18 17  ' Temp: (!) 100.6 F (38.1 C) 98.8 F (37.1 C) 99.3 F (37.4 C) 99.1 F (37.3 C)  TempSrc:  Oral Oral Oral  SpO2:  98% 99% 100%  Weight:      Height:        Intake/Output Summary (Last 24 hours) at 07/15/2018 2128 Last data filed at 07/15/2018 1822 Gross per 24 hour  Intake 940 ml  Output -  Net 940 ml   Filed  Weights   07/13/18 1617 07/14/18 0015  Weight: (!) 151.5 kg (!) 150.8 kg    Examination:  General exam: Appears calm and comfortable.  Obese. Respiratory system: Clear to auscultation. Respiratory effort normal. Cardiovascular system: S1 & S2 heard. Gastrointestinal system: Abdomen is obese, soft and nontender.  Organs are difficult to assess.   Central nervous system: Alert and oriented. No focal neurological deficits. Extremities: As of the ankle  Data Reviewed: I have personally reviewed following labs and imaging studies  CBC: Recent Labs  Lab 07/13/18 1630 07/14/18 0050 07/15/18 0152  WBC 6.3 6.0 6.7  NEUTROABS 4.2  --  3.0  HGB 13.1 12.5 12.5  HCT 37.8 35.9* 35.7*  MCV 80.8 79.2* 78.5*  PLT 230 161 202   Basic Metabolic Panel: Recent Labs  Lab 07/13/18 1630 07/14/18 0050 07/15/18 0152  NA 135 135 133*  K 3.5 3.8 3.9  CL 103 104 105  CO2 22 24 19*  GLUCOSE 135* 109* 94  BUN 5* <5* <5*  CREATININE 0.98 0.95 0.82  CALCIUM 8.6* 8.1* 8.1*   GFR: Estimated Creatinine Clearance: 133.9 mL/min (by C-G formula based on SCr of 0.82 mg/dL). Liver Function Tests: Recent Labs  Lab 07/13/18 1630 07/14/18 0050 07/15/18 0152  AST 1,683* 1,716* 2,322*  ALT 1,547* 1,640* 2,202*  ALKPHOS 187* 183* 178*  BILITOT 2.5* 2.6* 3.8*  PROT 7.5 6.7 6.7  ALBUMIN 3.3* 3.0* 2.9*   Recent Labs  Lab 07/13/18 1630  LIPASE 22   Recent Labs  Lab 07/13/18 1928  AMMONIA 69*   Coagulation Profile: Recent Labs  Lab 07/13/18 1928 07/15/18 0152  INR 1.35 1.45   Cardiac Enzymes: No results for input(s): CKTOTAL, CKMB, CKMBINDEX, TROPONINI in the last 168 hours. BNP (last 3 results) No results for input(s): PROBNP in the last 8760 hours. HbA1C: No results for input(s): HGBA1C in the last 72 hours. CBG: No results for input(s): GLUCAP in the last 168 hours. Lipid Profile: No results for input(s): CHOL, HDL, LDLCALC, TRIG, CHOLHDL, LDLDIRECT in the last 72 hours. Thyroid  Function Tests: No results for input(s): TSH, T4TOTAL, FREET4, T3FREE, THYROIDAB in the last 72 hours. Anemia Panel: No results for input(s): VITAMINB12, FOLATE, FERRITIN, TIBC, IRON, RETICCTPCT in the last 72 hours. Urine analysis:    Component Value Date/Time   COLORURINE AMBER (A) 07/13/2018 1630   APPEARANCEUR CLEAR 07/13/2018 1630   LABSPEC 1.038 (H) 07/13/2018 1630   PHURINE 5.0 07/13/2018 1630   GLUCOSEU NEGATIVE 07/13/2018 1630   HGBUR NEGATIVE 07/13/2018 1630   BILIRUBINUR MODERATE (A) 07/13/2018 1630   KETONESUR NEGATIVE 07/13/2018 1630   PROTEINUR 30 (A) 07/13/2018 1630   UROBILINOGEN 1.0 12/08/2016 1543   NITRITE NEGATIVE 07/13/2018 1630   LEUKOCYTESUR NEGATIVE 07/13/2018 1630   Sepsis Labs: '@LABRCNTIP' (procalcitonin:4,lacticidven:4)  ) Recent Results (from the past 240 hour(s))  Urine culture     Status: Abnormal   Collection Time: 07/13/18  4:30 PM  Result Value Ref Range Status   Specimen Description URINE, RANDOM  Final   Special Requests   Final    NONE Performed at [facility], [place] [address] [place], [place] [address]    Culture <10,000 COLONIES/mL INSIGNIFICANT GROWTH (A)  Final   Report Status 07/14/2018 FINAL  Final         Radiology Studies: US Abdomen Limited Ruq  Result Date: 07/14/2018 CLINICAL DATA:  Elevated LFTs. EXAM: ULTRASOUND ABDOMEN LIMITED RIGHT UPPER QUADRANT COMPARISON:  Abdominal MRI/MRCP yesterday. FINDINGS: Gallbladder: Only minimally distended, similar to prior MRCP. No gallstones or wall thickening visualized. No sonographic Murphy sign noted by sonographer. Common bile duct: Diameter: 3 mm, normal. Liver: Hemangioma in the posterior right lobe of the liver on prior exam is not well demonstrated sonographically. Within normal limits in parenchymal echogenicity. Portal vein is patent on color Doppler imaging with normal direction of blood flow towards the liver. IMPRESSION: 1. No explanation for elevated LFTs.  No biliary  dilatation. 2. Known hepatic hemangioma in the right lobe is not well visualized on the current exam. Electronically Signed   By: Keith Rake M.D.   On: 07/14/2018 01:41        Scheduled Meds: . enoxaparin (LOVENOX) injection  40 mg Subcutaneous Q24H   Continuous Infusions:    LOS: 0 days    Time spent: 25 minutes    Dana Allan, MD  [facility] Pager #: [phone] 7PM-7AM contact night coverage as above

## 2018-07-15 NOTE — Progress Notes (Signed)
Daily Rounding Note  07/15/2018, 9:54 AM  LOS: 0 days   SUBJECTIVE:   Chief complaint:  Acute hepatitis.     Upper abdominal pain, malaise, anorexia as were her presenting sxs.  Urine is dark and smell of it makes her feel sick.  No vomiting.    On a few occasions in 05/2018 had up to 5 shots of cognac in an evening during holiday gatherings, admits a couple of times she was intoxicated.  No ETOH for 2 or 3 weeks.    Drank 2 to 3 cups of weight loss tea earlier this month.   Took doxycycline for cellulitis starting 1210/2019 but did not finish the Rx.  Took 1 of the remaining doses doxy on 07/12/2018.    OBJECTIVE:         Vital signs in last 24 hours:    Temp:  [98.8 F (37.1 C)-100.6 F (38.1 C)] 99.3 F (37.4 C) (01/27 0427) Pulse Rate:  [61-66] 66 (01/27 0427) Resp:  [17-18] 18 (01/27 0427) BP: (103-122)/(59-66) 103/59 (01/27 0427) SpO2:  [98 %-100 %] 99 % (01/27 0427) Last BM Date: 07/12/18 Filed Weights   07/13/18 1617 07/14/18 0015  Weight: (!) 151.5 kg (!) 150.8 kg   General: pleasant, obese, comfortable, tired   Heart: RRR Chest: clear bil.   Abdomen: soft, slight RUQ pain, no guard or rebound.    Extremities: no CCE Neuro/Psych:  Alert, oriented x 3.  No tremor or asterixis.   Fluid speech and good historian.    Intake/Output from previous day: 01/26 0701 - 01/27 0700 In: 460 [P.O.:460] Out: -   Intake/Output this shift: No intake/output data recorded.  Lab Results: Recent Labs    07/13/18 1630 07/14/18 0050 07/15/18 0152  WBC 6.3 6.0 6.7  HGB 13.1 12.5 12.5  HCT 37.8 35.9* 35.7*  PLT 230 161 185   BMET Recent Labs    07/13/18 1630 07/14/18 0050 07/15/18 0152  NA 135 135 133*  K 3.5 3.8 3.9  CL 103 104 105  CO2 22 24 19*  GLUCOSE 135* 109* 94  BUN 5* <5* <5*  CREATININE 0.98 0.95 0.82  CALCIUM 8.6* 8.1* 8.1*   LFT Recent Labs    07/13/18 1630 07/14/18 0050 07/15/18 0152    PROT 7.5 6.7 6.7  ALBUMIN 3.3* 3.0* 2.9*  AST 1,683* 1,716* 2,322*  ALT 1,547* 1,640* 2,202*  ALKPHOS 187* 183* 178*  BILITOT 2.5* 2.6* 3.8*   PT/INR Recent Labs    07/13/18 1928 07/15/18 0152  LABPROT 16.5* 17.5*  INR 1.35 1.45   Hepatitis Panel No results for input(s): HEPBSAG, HCVAB, HEPAIGM, HEPBIGM in the last 72 hours.  Studies/Results: Dg Chest 2 View  Result Date: 07/13/2018 CLINICAL DATA:  Fever, chills, and shortness of breath for 4 days. EXAM: CHEST - 2 VIEW COMPARISON:  12/24/2016 FINDINGS: The heart size and mediastinal contours are within normal limits. Both lungs are clear. The visualized skeletal structures are unremarkable. IMPRESSION: Negative.  No active cardiopulmonary disease. Electronically Signed   By: Earle Gell M.D.   On: 07/13/2018 17:43   Mr Liver W Wo Contrast  Result Date: 07/13/2018 CLINICAL DATA:  Liver lesion enlargement, for definitive characterization. EXAM: MRI ABDOMEN WITHOUT AND WITH CONTRAST TECHNIQUE: Multiplanar multisequence MR imaging of the abdomen was performed both before and after the administration of intravenous contrast. CONTRAST:  9.5 cc Gadavist COMPARISON:  Abdominal ultrasound 01/02/2018 FINDINGS: Lower chest: Mild cardiomegaly. Hepatobiliary: In segment  7 of the liver there is a 3.0 by 2.0 cm T2 hyperintense lesion with sharp marginal definition, high T2 signal, and peripheral nodular discontinuous centripetal enhancement diagnostic of hepatic hemangioma. Gallbladder unremarkable. The biliary tree appears normal. No additional significant hepatobiliary findings. Pancreas: There is a 3.2 by 2.0 by 2.3 cm mass within or along the uncinate process of the pancreas as shown on image 92/1101, which is hypoenhancing on arterial phase images, isoenhancing on portal venous phase images, and slightly hyperenhancing relative to the pancreas on the delayed phase images. The lesion does not appear to invade adjacent structures including the SMV.  Spleen:  Unremarkable Adrenals/Urinary Tract:  Unremarkable Stomach/Bowel: Unremarkable Vascular/Lymphatic: 2.0 cm peripancreatic lymph node, image 75/1101. 1.8 cm in short axis portacaval lymph node on image 23/8. Additional smaller confluent porta hepatis and celiac nodes are present along with small right gastric nodes. Small retroperitoneal lymph nodes are also present. Other:  No supplemental non-categorized findings. Musculoskeletal: Unremarkable IMPRESSION: 1. Although the hepatic lesion of concern is shown to represent a benign hepatic hemangioma, there is a concerning lesion measuring 3.2 by 2.0 by 2.3 cm in the pancreas along the uncinate process which demonstrates arterial phase hypoenhancement, portal venous phase isoenhancement, and mild hyperenhancement on the delayed phase. There is also pathologically enlarged adenopathy in the peripancreatic/porta hepatis region. The lesion is sharply defined and the delayed enhancement would be somewhat atypical for adenocarcinoma, but the constellation of findings indicates the need for tissue diagnosis as malignancy of the pancreas is a distinct possibility. 2. Mild cardiomegaly. Electronically Signed   By: Van Clines M.D.   On: 07/13/2018 15:30   US Abdomen Limited Ruq  Result Date: 07/14/2018 CLINICAL DATA:  Elevated LFTs. EXAM: ULTRASOUND ABDOMEN LIMITED RIGHT UPPER QUADRANT COMPARISON:  Abdominal MRI/MRCP yesterday. FINDINGS: Gallbladder: Only minimally distended, similar to prior MRCP. No gallstones or wall thickening visualized. No sonographic Murphy sign noted by sonographer. Common bile duct: Diameter: 3 mm, normal. Liver: Hemangioma in the posterior right lobe of the liver on prior exam is not well demonstrated sonographically. Within normal limits in parenchymal echogenicity. Portal vein is patent on color Doppler imaging with normal direction of blood flow towards the liver. IMPRESSION: 1. No explanation for elevated LFTs.  No biliary  dilatation. 2. Known hepatic hemangioma in the right lobe is not well visualized on the current exam. Electronically Signed   By: Keith Rake M.D.   On: 07/14/2018 01:41    ASSESMENT:    *  Acute hepatitis, hepatic injury.  AST/ALT 1683/1547 >> 2322/2202.  t bili 2.5 >> 3.8.  Alk phos 187 >> 178.   Hx liver hemangioma.  Current MRI shows hemangioma stable but new lesion in pancreatic uncinate.  Hep A IgM in progress.   HCV Ab in progress Hep B core Ab in progress Hep B surface Ag in progress Seen once 06/2016 by GI APP, Dr Loletha Carrow supervising for ? Biliary colic.  [place] 02/3809 and 06/7508 with Hemangioma, on 12/2017 [place] lesion larger but still favoring hemangioma so MRI recommended.   normal HIDA 06/2016.   [place] 07/14/2018: hemangioma not well seen.  No pancreatic lesion.  3 mm CBD.     MRI 07/13/2018: liver hemangioma and new lesion in pancreatic uncinate with associated peripancreatic and porta hepatis pathologic adenopathy   *   New pancreatic lesion at uncinate.  Will need EUS/FNA.  This is set for 8 AM Wed 1/29 with Dr Oretha Caprice.  Procedure d/w pt and she is eager to  proceed.   Anatomy of pancreas, liver, biliary tract using on screen images explained to pt.     *    Microcytosis without anemia.     PLAN   *   EUS/FNA (possible ERCP?) 9 AM on Wed.    *   Regular diet.  CMET and PT/INR daily.      Kathryn Stevenson  07/15/2018, 9:54 AM Phone [phone]

## 2018-07-16 DIAGNOSIS — R748 Abnormal levels of other serum enzymes: Secondary | ICD-10-CM

## 2018-07-16 LAB — PROTIME-INR
INR: 1.48
Prothrombin Time: 17.8 seconds — ABNORMAL HIGH (ref 11.4–15.2)

## 2018-07-16 LAB — COMPREHENSIVE METABOLIC PANEL
ALT: 2426 U/L — ABNORMAL HIGH (ref 0–44)
AST: 2371 U/L — ABNORMAL HIGH (ref 15–41)
Albumin: 2.7 g/dL — ABNORMAL LOW (ref 3.5–5.0)
Alkaline Phosphatase: 153 U/L — ABNORMAL HIGH (ref 38–126)
Anion gap: 10 (ref 5–15)
BUN: <5 mg/dL — ABNORMAL LOW (ref 6–20)
CO2: 21 mmol/L — ABNORMAL LOW (ref 22–32)
Calcium: 8.3 mg/dL — ABNORMAL LOW (ref 8.9–10.3)
Chloride: 102 mmol/L (ref 98–111)
Creatinine, Ser: 0.76 mg/dL (ref 0.44–1.00)
GFR calc Af Amer: >60 mL/min (ref >60)
GFR calc non Af Amer: >60 mL/min (ref >60)
Glucose, Bld: 85 mg/dL (ref 70–99)
Potassium: 3.8 mmol/L (ref 3.5–5.1)
Sodium: 133 mmol/L — ABNORMAL LOW (ref 135–145)
Total Bilirubin: 5.9 mg/dL — ABNORMAL HIGH (ref 0.3–1.2)
Total Protein: 6.8 g/dL (ref 6.5–8.1)

## 2018-07-16 LAB — PATHOLOGIST SMEAR REVIEW

## 2018-07-16 LAB — AMMONIA: Ammonia: 51 umol/L — ABNORMAL HIGH (ref 9–35)

## 2018-07-16 LAB — BILIRUBIN, DIRECT: Bilirubin, Direct: 4.2 mg/dL — ABNORMAL HIGH (ref 0.0–0.2)

## 2018-07-16 MED ORDER — IBUPROFEN 400 MG PO TABS
400.0000 mg | ORAL_TABLET | Freq: Once | ORAL | Status: AC
  Administered 2018-07-16: 400 mg via ORAL
  Filled 2018-07-16: qty 1

## 2018-07-16 MED ORDER — IBUPROFEN 400 MG PO TABS
400.0000 mg | ORAL_TABLET | Freq: Four times a day (QID) | ORAL | Status: DC | PRN
  Administered 2018-07-16: 400 mg via ORAL
  Filled 2018-07-16: qty 1

## 2018-07-16 MED ORDER — IBUPROFEN 400 MG PO TABS
400.0000 mg | ORAL_TABLET | Freq: Three times a day (TID) | ORAL | Status: DC
  Administered 2018-07-16 – 2018-07-17 (×2): 800 mg via ORAL
  Filled 2018-07-16 (×2): qty 2

## 2018-07-16 MED ORDER — ENSURE ENLIVE PO LIQD
237.0000 mL | Freq: Two times a day (BID) | ORAL | Status: DC
  Administered 2018-07-16 – 2018-07-18 (×4): 237 mL via ORAL

## 2018-07-16 NOTE — Care Management Note (Signed)
Case Management Note  Patient Details  Name: Kathryn Stevenson MRN: [id number] Date of Birth: 1975/09/14  Subjective/Objective:                    Action/Plan:  Discuss discharge planning with patient at bedside.   She has PCP [name] at [facility]. Patient has [facility], and on 07/19/18 her [facility] will be active. Patient states she just gave all information to Development worker, community .  She has been paying for her prescriptions.   Will continue to follow for discharge needs.   Patient has Family Medicaid so she is not eligible for MATCH ( medication assistance).   Expected Discharge Date:  07/16/18               Expected Discharge Plan:  Home/Self Care  In-House Referral:  Financial Counselor  Discharge planning Services  CM Consult  Post Acute Care Choice:    Choice offered to:  Patient  DME Arranged:  N/A DME Agency:  NA  HH Arranged:  NA HH Agency:     Status of Service:  In process, will continue to follow  If discussed at Long Length of Stay Meetings, dates discussed:    Additional Comments:  Marilu Favre, RN 07/16/2018, 1:06 PM

## 2018-07-16 NOTE — Progress Notes (Addendum)
          Daily Rounding Note  07/16/2018, 8:33 AM  LOS: 0 days   SUBJECTIVE:   Chief complaint: Acute hepatitis, pancreatic lesion    No fevers.  BPs running soft in 90s-low 100s/40s-60s.   C/o headaches, narcotics did not help.   Anorexia, no nause or abd pain.   OBJECTIVE:         Vital signs in last 24 hours:    Temp:  [98.4 F (36.9 C)-99.1 F (37.3 C)] 98.4 F (36.9 C) (01/28 0635) Pulse Rate:  [71-80] 71 (01/28 0635) Resp:  [17-20] 18 (01/27 2220) BP: (90-104)/(46-62) 90/56 (01/28 0635) SpO2:  [98 %-100 %] 98 % (01/28 0635) Last BM Date: 07/12/18 Filed Weights   07/13/18 1617 07/14/18 0015  Weight: (!) 151.5 kg (!) 150.8 kg   General: looks ok, tired   Heart: RRR Chest: clear bil.   Abdomen: soft, NT, ND.  Hypoactive.    Extremities: no CCE Neuro/Psych:  Alert, oriented x 3.  Moves all 4s.    Intake/Output from previous day: 01/27 0701 - 01/28 0700 In: 840 [P.O.:840] Out: -   Intake/Output this shift: No intake/output data recorded.  Lab Results: Recent Labs    07/13/18 1630 07/14/18 0050 07/15/18 0152  WBC 6.3 6.0 6.7  HGB 13.1 12.5 12.5  HCT 37.8 35.9* 35.7*  PLT 230 161 185   BMET Recent Labs    07/14/18 0050 07/15/18 0152 07/16/18 0419  NA 135 133* 133*  K 3.8 3.9 3.8  CL 104 105 102  CO2 24 19* 21*  GLUCOSE 109* 94 85  BUN <5* <5* <5*  CREATININE 0.95 0.82 0.76  CALCIUM 8.1* 8.1* 8.3*   LFT Recent Labs    07/14/18 0050 07/15/18 0152 07/16/18 0419  PROT 6.7 6.7 6.8  ALBUMIN 3.0* 2.9* 2.7*  AST 1,716* 2,322* 2,371*  ALT 1,640* 2,202* 2,426*  ALKPHOS 183* 178* 153*  BILITOT 2.6* 3.8* 5.9*   PT/INR Recent Labs    07/15/18 0152 07/16/18 0419  LABPROT 17.5* 17.8*  INR 1.45 1.48   Hepatitis Panel Recent Labs    07/14/18 0050 07/14/18 1107  HCVAB 0.2  --   HEPAIGM  --  Negative   Scheduled Meds: . enoxaparin (LOVENOX) injection  40 mg Subcutaneous Q24H    Continuous Infusions: PRN Meds:.ondansetron **OR** ondansetron (ZOFRAN) IV, SUMAtriptan, SUMAtriptan   ASSESMENT:   *   Acute hepatitis. ? Viral or DILI.    T bili, transaminases rising, alk phos declining.  PT minimally rising, INR normal.    Hep A and C negative Hep B Core Ab total positive Hep B core IgM pending Hep B Surface Ag pending Hep B Surface Ab qual pending Hep Be Ag pending ANA pending Use of left-over Septra (researched using pill ID) for 2 doses starting 07/12/18,    *   Previously established presence of hepatic hemangioma.    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   ? Is adenopathy reactive to acute hepatitis or related to this pancreatic lesion.    *   Microcytosis without anemia.    PLAN   *   Await pending serologies.   Check iron studies in AM.    *   Ibuprofen 400 mg prn for headache.    *   Added ensure BID.      Kathryn Stevenson  07/16/2018, 8:33 AM Phone [phone]

## 2018-07-16 NOTE — Progress Notes (Signed)
PROGRESS NOTE    Kathryn Stevenson  VXB:[id number] DOB: 04/28/1976 DOA: 07/13/2018 PCP: [name], FNP  Outpatient Specialists:     Brief Narrative: Kathryn Stevenson is a 43 y.o. female with medical history significant for obesity, GERD, and hepatic hemangioma who presents the ED with 3 days of generalized fatigue, malaise, subjective fevers, chills, dyspnea, intermittent lightheadedness, and nausea without emesis.  She reports having chronic liver lesion which is being followed by GI and has been benign appearing.  She felt as she was having a viral URI recently and took one Tylenol yesterday, but denies otherwise excessive Tylenol use. The only other medication she takes is as needed omeprazole.  She denies any abdominal pain, chest pain, dysuria.  Patient had follow-up MRI of the liver with and without contrast completed prior to ED arrival which showed the benign-appearing hepatic hemangioma in question however there was a concerning pancreatic lesion along the uncinate process with associated pathologic adenopathy in the peripancreatic/porta hepatis region.  The findings for atypical for adenocarcinoma however due to possibility of pancreatic malignancy tissue diagnosis was recommended.  07/14/2018: Patient seen.  GI input is appreciated.  Hepatitis A virus screening added to the wound work-up.  Continue to trend LFTs.  Follow pending work-up.  07/16/2018: Worsening ALT and AST.  GI team is directing care.  Migraine headache has resolved.  Continue with Imitrex as needed.  For possible EUS and biopsy of the pancreatic lesion tomorrow as per GI note, with possible ERCP.   Assessment & Plan:   Principal Problem:   Liver dysfunction Active Problems:   Pancreatic lesion  Elevated LFTs/acute hepatitis: AST 1683, ALT 1547, alk phos 187, T bili 2.5.  Last LFTs on file were from July 2018 and were normal at that time.  There is concern for potential developing liver failure contributing to  her presenting symptoms.  GI were consulted and will see in a.m. -Obtain RUQ ultrasound -Check hepatitis B and C serologies, HIV antibody -Appreciate follow-up GI consultation 07/14/2018: GI input is appreciated.  Hepatitis A virus screening added.  Follow pending work-up and LFTs. 07/15/2018: Worsening ALT and AST.  GI team is directing care.  Patient reported migraine-like headache.  Will treat patient with Imitrex.  Further management depend on hospital course.  For EUS M biopsy of the pancreatic lesion on Wednesday as per GI notes, with ERCP. 07/16/2018: Worsening LFTs.  GI is directing care.  Etiology is unclear.  Pancreatic lesion: Concerning pancreatic lesion with associated pathologic adenopathy was seen on follow-up MRI liver to evaluate known hepatic hemangioma.  I discussed the results with the patient with likely need for tissue biopsy. -Follow-up GI recommendations appreciated 07/14/2018: Likely endoscopic ultrasound and biopsy when patient is more stable.  Morbid obesity:  Diet and exercise.  Possibly undiagnosed OSA/OHS:  Further work-up on an outpatient basis.  Migraine-like headache: Subcu Imitrex x2 doses. Start as needed oral Imitrex from tomorrow. 07/16/2018: Migraine headache has resolved significantly.  DVT prophylaxis:  Daily Lovenox Code Status: Full code, confirmed with patient Family Communication:  Daughter Disposition Plan: Home eventually  Consultants:   GI  Procedures:   None  Antimicrobials:   None   Subjective: Reports migraine-like headache.  Objective: Vitals:   07/15/18 2138 07/15/18 2220 07/16/18 0635 07/16/18 1325  BP: (!) 91/46 (!) 96/58 (!) 90/56 111/65  Pulse: 78 72 71 (!) 51  Resp: '20 18  16  ' Temp: 99.1 F (37.3 C)  98.4 F (36.9 C) 98.5 F (36.9 C)  TempSrc: Oral  Oral Oral  SpO2: 99% 99% 98% 100%  Weight:      Height:        Intake/Output Summary (Last 24 hours) at 07/16/2018 1419 Last data filed at 07/15/2018  1822 Gross per 24 hour  Intake 240 ml  Output -  Net 240 ml   Filed Weights   07/13/18 1617 07/14/18 0015  Weight: (!) 151.5 kg (!) 150.8 kg    Examination:  General exam: Appears calm and comfortable.  Obese. Respiratory system: Clear to auscultation. Respiratory effort normal. Cardiovascular system: S1 & S2 heard. Gastrointestinal system: Abdomen is obese, soft and nontender.  Organs are difficult to assess.   Central nervous system: Alert and oriented. No focal neurological deficits. Extremities: As of the ankle  Data Reviewed: I have personally reviewed following labs and imaging studies  CBC: Recent Labs  Lab 07/13/18 1630 07/14/18 0050 07/15/18 0152  WBC 6.3 6.0 6.7  NEUTROABS 4.2  --  3.0  HGB 13.1 12.5 12.5  HCT 37.8 35.9* 35.7*  MCV 80.8 79.2* 78.5*  PLT 230 161 161   Basic Metabolic Panel: Recent Labs  Lab 07/13/18 1630 07/14/18 0050 07/15/18 0152 07/16/18 0419  NA 135 135 133* 133*  K 3.5 3.8 3.9 3.8  CL 103 104 105 102  CO2 22 24 19* 21*  GLUCOSE 135* 109* 94 85  BUN 5* <5* <5* <5*  CREATININE 0.98 0.95 0.82 0.76  CALCIUM 8.6* 8.1* 8.1* 8.3*   GFR: Estimated Creatinine Clearance: 137.3 mL/min (by C-G formula based on SCr of 0.76 mg/dL). Liver Function Tests: Recent Labs  Lab 07/13/18 1630 07/14/18 0050 07/15/18 0152 07/16/18 0419  AST 1,683* 1,716* 2,322* 2,371*  ALT 1,547* 1,640* 2,202* 2,426*  ALKPHOS 187* 183* 178* 153*  BILITOT 2.5* 2.6* 3.8* 5.9*  PROT 7.5 6.7 6.7 6.8  ALBUMIN 3.3* 3.0* 2.9* 2.7*   Recent Labs  Lab 07/13/18 1630  LIPASE 22   Recent Labs  Lab 07/13/18 1928  AMMONIA 69*   Coagulation Profile: Recent Labs  Lab 07/13/18 1928 07/15/18 0152 07/16/18 0419  INR 1.35 1.45 1.48   Cardiac Enzymes: No results for input(s): CKTOTAL, CKMB, CKMBINDEX, TROPONINI in the last 168 hours. BNP (last 3 results) No results for input(s): PROBNP in the last 8760 hours. HbA1C: No results for input(s): HGBA1C in the last  72 hours. CBG: No results for input(s): GLUCAP in the last 168 hours. Lipid Profile: No results for input(s): CHOL, HDL, LDLCALC, TRIG, CHOLHDL, LDLDIRECT in the last 72 hours. Thyroid Function Tests: No results for input(s): TSH, T4TOTAL, FREET4, T3FREE, THYROIDAB in the last 72 hours. Anemia Panel: No results for input(s): VITAMINB12, FOLATE, FERRITIN, TIBC, IRON, RETICCTPCT in the last 72 hours. Urine analysis:    Component Value Date/Time   COLORURINE AMBER (A) 07/13/2018 1630   APPEARANCEUR CLEAR 07/13/2018 1630   LABSPEC 1.038 (H) 07/13/2018 1630   PHURINE 5.0 07/13/2018 1630   GLUCOSEU NEGATIVE 07/13/2018 1630   HGBUR NEGATIVE 07/13/2018 1630   BILIRUBINUR MODERATE (A) 07/13/2018 1630   KETONESUR NEGATIVE 07/13/2018 1630   PROTEINUR 30 (A) 07/13/2018 1630   UROBILINOGEN 1.0 12/08/2016 1543   NITRITE NEGATIVE 07/13/2018 1630   LEUKOCYTESUR NEGATIVE 07/13/2018 1630   Sepsis Labs: '@LABRCNTIP' (procalcitonin:4,lacticidven:4)  ) Recent Results (from the past 240 hour(s))  Urine culture     Status: Abnormal   Collection Time: 07/13/18  4:30 PM  Result Value Ref Range Status   Specimen Description URINE, RANDOM  Final   Special Requests  Final    NONE Performed at [facility], [place] [address] [place], [place] [address]    Culture <10,000 COLONIES/mL INSIGNIFICANT GROWTH (A)  Final   Report Status 07/14/2018 FINAL  Final         Radiology Studies: No results found.      Scheduled Meds: . enoxaparin (LOVENOX) injection  40 mg Subcutaneous Q24H  . feeding supplement (ENSURE ENLIVE)  237 mL Oral BID BM  . ibuprofen  400-800 mg Oral Q8H   Continuous Infusions:    LOS: 0 days    Time spent: 25 minutes    Dana Allan, MD  [facility] Pager #: [phone] 7PM-7AM contact night coverage as above

## 2018-07-17 ENCOUNTER — Encounter (HOSPITAL_COMMUNITY)
Admission: EM | Disposition: A | Discharge status: Home / Self Care | Payer: Self-pay | Source: Home / Self Care | Attending: Internal Medicine

## 2018-07-17 DIAGNOSIS — Z6841 Body Mass Index (BMI) 40.0 and over, adult: Secondary | ICD-10-CM

## 2018-07-17 DIAGNOSIS — K869 Disease of pancreas, unspecified: Secondary | ICD-10-CM

## 2018-07-17 DIAGNOSIS — R5381 Other malaise: Secondary | ICD-10-CM

## 2018-07-17 DIAGNOSIS — R7989 Other specified abnormal findings of blood chemistry: Secondary | ICD-10-CM

## 2018-07-17 DIAGNOSIS — R5383 Other fatigue: Secondary | ICD-10-CM

## 2018-07-17 DIAGNOSIS — R945 Abnormal results of liver function studies: Secondary | ICD-10-CM

## 2018-07-17 DIAGNOSIS — R51 Headache: Secondary | ICD-10-CM

## 2018-07-17 LAB — COMPREHENSIVE METABOLIC PANEL
ALT: 2104 U/L — ABNORMAL HIGH (ref 0–44)
AST: 1713 U/L — ABNORMAL HIGH (ref 15–41)
Albumin: 2.7 g/dL — ABNORMAL LOW (ref 3.5–5.0)
Alkaline Phosphatase: 177 U/L — ABNORMAL HIGH (ref 38–126)
Anion gap: 11 (ref 5–15)
BUN: 6 mg/dL (ref 6–20)
CO2: 21 mmol/L — ABNORMAL LOW (ref 22–32)
Calcium: 8.7 mg/dL — ABNORMAL LOW (ref 8.9–10.3)
Chloride: 103 mmol/L (ref 98–111)
Creatinine, Ser: 0.74 mg/dL (ref 0.44–1.00)
GFR calc Af Amer: >60 mL/min (ref >60)
GFR calc non Af Amer: >60 mL/min (ref >60)
Glucose, Bld: 109 mg/dL — ABNORMAL HIGH (ref 70–99)
Potassium: 3.5 mmol/L (ref 3.5–5.1)
Sodium: 135 mmol/L (ref 135–145)
Total Bilirubin: 6.7 mg/dL — ABNORMAL HIGH (ref 0.3–1.2)
Total Protein: 7.1 g/dL (ref 6.5–8.1)

## 2018-07-17 LAB — IRON AND TIBC
Iron: 123 ug/dL (ref 28–170)
Saturation Ratios: 39 % — ABNORMAL HIGH (ref 10.4–31.8)
TIBC: 315 ug/dL (ref 250–450)
UIBC: 192 ug/dL

## 2018-07-17 LAB — ANTINUCLEAR ANTIBODIES, IFA: ANA Ab, IFA: POSITIVE — AB

## 2018-07-17 LAB — HEPATITIS B E ANTIGEN: Hep B E Ag: POSITIVE — AB

## 2018-07-17 LAB — HEPATITIS B SURFACE AG, CONFIRM: HBsAg Confirmation: POSITIVE — AB

## 2018-07-17 LAB — FANA STAINING PATTERNS
Homogeneous Pattern: 1:80 {titer}
Speckled Pattern: 1:80 {titer}

## 2018-07-17 LAB — RETICULOCYTES
Immature Retic Fract: 21.6 % — ABNORMAL HIGH (ref 2.3–15.9)
RBC.: 4.29 MIL/uL (ref 3.87–5.11)
Retic Count, Absolute: 55.8 10*3/uL (ref 19.0–186.0)
Retic Ct Pct: 1.3 % (ref 0.4–3.1)

## 2018-07-17 LAB — FERRITIN: Ferritin: 1214 ng/mL — ABNORMAL HIGH (ref 11–307)

## 2018-07-17 LAB — HEPATITIS B CORE ANTIBODY, IGM: Hep B C IgM: POSITIVE — AB

## 2018-07-17 LAB — PROTIME-INR
INR: 1.31
Prothrombin Time: 16.1 seconds — ABNORMAL HIGH (ref 11.4–15.2)

## 2018-07-17 LAB — HEPATITIS B SURFACE ANTIGEN

## 2018-07-17 SURGERY — UPPER ESOPHAGEAL ENDOSCOPIC ULTRASOUND (EUS)
Anesthesia: Monitor Anesthesia Care

## 2018-07-17 MED ORDER — OXYCODONE-ACETAMINOPHEN 5-325 MG PO TABS
1.0000 | ORAL_TABLET | ORAL | Status: DC | PRN
  Administered 2018-07-17 – 2018-07-18 (×3): 1 via ORAL
  Filled 2018-07-17 (×3): qty 1

## 2018-07-17 NOTE — Progress Notes (Signed)
Daily Rounding Note  07/17/2018, 8:17 AM  LOS: 1 day   SUBJECTIVE:   Chief complaint: acute hepatitis, pancreatic lesion   Still having HAs.  meds help for up to 1 hour. Energy level improved.  Appetite remains depressed.  BM yesterday.    OBJECTIVE:         Vital signs in last 24 hours:    Temp:  [98.1 F (36.7 C)-98.7 F (37.1 C)] 98.1 F (36.7 C) (01/29 0523) Pulse Rate:  [51-84] 84 (01/29 0523) Resp:  [16-18] 18 (01/29 0523) BP: (97-111)/(62-65) 110/65 (01/29 0523) SpO2:  [98 %-100 %] 99 % (01/29 0523) Last BM Date: 07/16/18(per patient) Filed Weights   07/13/18 1617 07/14/18 0015  Weight: (!) 151.5 kg (!) 150.8 kg   General: looks well.     Heart: RRR Chest: clear bil.  No cough or SOB Abdomen: soft, NT, ND.  Active BS  Extremities: no CCE Neuro/Psych:  Oriented x 3.  No weakness or gross deficits.    Intake/Output from previous day: 01/28 0701 - 01/29 0700 In: 540 [P.O.:540] Out: -   Intake/Output this shift: No intake/output data recorded.  Lab Results: Recent Labs    07/15/18 0152  WBC 6.7  HGB 12.5  HCT 35.7*  PLT 185   BMET Recent Labs    07/15/18 0152 07/16/18 0419 07/17/18 0159  NA 133* 133* 135  K 3.9 3.8 3.5  CL 105 102 103  CO2 19* 21* 21*  GLUCOSE 94 85 109*  BUN <5* <5* 6  CREATININE 0.82 0.76 0.74  CALCIUM 8.1* 8.3* 8.7*   LFT Recent Labs    07/15/18 0152 07/16/18 0419 07/16/18 2224 07/17/18 0159  PROT 6.7 6.8  --  7.1  ALBUMIN 2.9* 2.7*  --  2.7*  AST 2,322* 2,371*  --  1,713*  ALT 2,202* 2,426*  --  2,104*  ALKPHOS 178* 153*  --  177*  BILITOT 3.8* 5.9*  --  6.7*  BILIDIR  --   --  4.2*  --    PT/INR Recent Labs    07/16/18 0419 07/17/18 0159  LABPROT 17.8* 16.1*  INR 1.48 1.31   Hepatitis Panel Recent Labs    07/14/18 1107  HEPAIGM Negative     Ref. Range 07/17/2018 01:59  Iron Latest Ref Range: 28 - 170 ug/dL 123  UIBC Latest Units: ug/dL  192  TIBC Latest Ref Range: 250 - 450 ug/dL 315  Saturation Ratios Latest Ref Range: 10.4 - 31.8 % 39 (H)  Ferritin Latest Ref Range: 11 - 307 ng/mL 1,214 (H)   Scheduled Meds: . enoxaparin (LOVENOX) injection  40 mg Subcutaneous Q24H  . feeding supplement (ENSURE ENLIVE)  237 mL Oral BID BM  . ibuprofen  400-800 mg Oral Q8H   Continuous Infusions: PRN Meds:.ondansetron **OR** ondansetron (ZOFRAN) IV, SUMAtriptan, SUMAtriptan   ASSESMENT:   *   Acute hepatitis. ? Viral or DILI.    T bili, alk phos rising, transaminases declining.  coags declining but never significantly elevated.   Hep A and C negative Hep B Core Ab total positive Hep B core IgM pending Hep B Surface Ag pending Hep B Surface Ab qual non-reactive Hep B e Ag pending ANA pending Use of left-over Septra (researched using pill ID) for 2 doses starting 07/12/18,    *   Previously established presence of hepatic hemangioma.    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   ?  Is adenopathy reactive to acute hepatitis or related to this pancreatic lesion.   No plans for immediate EUS (+/- ERCP) but Dr Ardis Hughs or Mansouraty will pursue as outpt.    *   Microcytosis without anemia. Not iron deficient.      *  Headaches, ? Related to hepatitis.  Improved with imitrex and ibuprofen.     PLAN   *   Continue supportive care.  CMET in AM  Await final Hep B serologies.     Azucena Freed  07/17/2018, 8:17 AM Phone [phone]

## 2018-07-17 NOTE — Progress Notes (Signed)
PROGRESS NOTE    Kathryn Stevenson  OZD:[id number] DOB: 1976-03-19 DOA: 07/13/2018 PCP: [name], FNP    Brief Narrative:  43 year old female who presented with malaise and dyspnea, she does have significant past medical history for obesity, GERD and hepatic hemangioma.  Reported 3 days of generalized fatigue, malaise, subjective fevers, chills, dyspnea and lightheadedness.  Denied any abdominal pain.  On her initial physical examination blood pressure 110/78, heart rate 86, respiratory rate 19, temperature 100.6 F, oxygen saturation 100%.  She had moist mucous membranes, lungs clear to auscultation bilaterally, heart S1-S2 present and rhythmic, the abdomen was soft nontender, no lower extremity edema.  Her AST was 1683, ALT 1547, alkaline phosphatase 187, total bilirubin is 2.5, ammonia 69, INR 1.3, lipase 22.  MRI of the abdomen showed a pancreatic lesion along the uncinate process associated with pathologic adenopathy.  Patient was admitted to the hospital with transaminitis, hyperbilirubinemia and impending liver failure.  Assessment & Plan:   Principal Problem:   Liver dysfunction Active Problems:   Pancreatic lesion   1. Acute hepatitis B. Patient continue to have nausea, no vomiting. Will continue supportive medical therapy, avoid non steroidal inflammatory agents and high doses of acetaminophen. Patient with no encephalopathy or coagulopathy, AST trending down to 1,713 and ALT down to 2,104 with total bilirubins at 6,7 from 5,9. Positive icterus on examination. Continue gentle IV fluids and follow liver function testing in am. Out of bed as tolerated and ambulate in the hallway.   2. Pancreatic lesion. Will need follow up as outpatient.   3. Morbid obesity. Will need outpatient follow up and life style modifications.   4. Headache. Not responding to sumatriptan or ibuprofen, will add percocet for now.    DVT prophylaxis: enoxaparin   Code Status: full Family Communication:  no family at the bedside  Disposition Plan/ discharge barriers: pending liver function testing improving.   Body mass index is 53.66 kg/m. Malnutrition Type:      Malnutrition Characteristics:      Nutrition Interventions:     RN Pressure Injury Documentation:     Consultants:   GI   Procedures:     Antimicrobials:       Subjective: Patient with poor appetite, associated with nausea, no frank vomiting, no chest pain, no dyspnea. Feeling very weak and deconditioned, positive headache this am.   Objective: Vitals:   07/16/18 0635 07/16/18 1325 07/16/18 2015 07/17/18 0523  BP: (!) 90/56 111/65 97/62 110/65  Pulse: 71 (!) 51 64 84  Resp:  16 17 18   Temp: 98.4 F (36.9 C) 98.5 F (36.9 C) 98.7 F (37.1 C) 98.1 F (36.7 C)  TempSrc: Oral Oral Oral Oral  SpO2: 98% 100% 98% 99%  Weight:      Height:        Intake/Output Summary (Last 24 hours) at 07/17/2018 0914 Last data filed at 07/17/2018 0606 Gross per 24 hour  Intake 540 ml  Output -  Net 540 ml   Filed Weights   07/13/18 1617 07/14/18 0015  Weight: (!) 151.5 kg (!) 150.8 kg    Examination:   General: deconditioned and ill looking appearing  Neurology: Awake and alert, non focal  E ENT: mild pallor, mild icterus, oral mucosa moist Cardiovascular: No JVD. S1-S2 present, rhythmic, no gallops, rubs, or murmurs. No lower extremity edema. Pulmonary: positive breath sounds bilaterally, adequate air movement, no wheezing, rhonchi or rales. Gastrointestinal. Abdomen protuberant, soft with no organomegaly, non tender, no rebound or guarding Skin.  No rashes Musculoskeletal: no joint deformities     Data Reviewed: I have personally reviewed following labs and imaging studies  CBC: Recent Labs  Lab 07/13/18 1630 07/14/18 0050 07/15/18 0152  WBC 6.3 6.0 6.7  NEUTROABS 4.2  --  3.0  HGB 13.1 12.5 12.5  HCT 37.8 35.9* 35.7*  MCV 80.8 79.2* 78.5*  PLT 230 161 568   Basic Metabolic  Panel: Recent Labs  Lab 07/13/18 1630 07/14/18 0050 07/15/18 0152 07/16/18 0419 07/17/18 0159  NA 135 135 133* 133* 135  K 3.5 3.8 3.9 3.8 3.5  CL 103 104 105 102 103  CO2 22 24 19* 21* 21*  GLUCOSE 135* 109* 94 85 109*  BUN 5* <5* <5* <5* 6  CREATININE 0.98 0.95 0.82 0.76 0.74  CALCIUM 8.6* 8.1* 8.1* 8.3* 8.7*   GFR: Estimated Creatinine Clearance: 137.3 mL/min (by C-G formula based on SCr of 0.74 mg/dL). Liver Function Tests: Recent Labs  Lab 07/13/18 1630 07/14/18 0050 07/15/18 0152 07/16/18 0419 07/17/18 0159  AST 1,683* 1,716* 2,322* 2,371* 1,713*  ALT 1,547* 1,640* 2,202* 2,426* 2,104*  ALKPHOS 187* 183* 178* 153* 177*  BILITOT 2.5* 2.6* 3.8* 5.9* 6.7*  PROT 7.5 6.7 6.7 6.8 7.1  ALBUMIN 3.3* 3.0* 2.9* 2.7* 2.7*   Recent Labs  Lab 07/13/18 1630  LIPASE 22   Recent Labs  Lab 07/13/18 1928 07/16/18 2224  AMMONIA 69* 51*   Coagulation Profile: Recent Labs  Lab 07/13/18 1928 07/15/18 0152 07/16/18 0419 07/17/18 0159  INR 1.35 1.45 1.48 1.31   Cardiac Enzymes: No results for input(s): CKTOTAL, CKMB, CKMBINDEX, TROPONINI in the last 168 hours. BNP (last 3 results) No results for input(s): PROBNP in the last 8760 hours. HbA1C: No results for input(s): HGBA1C in the last 72 hours. CBG: No results for input(s): GLUCAP in the last 168 hours. Lipid Profile: No results for input(s): CHOL, HDL, LDLCALC, TRIG, CHOLHDL, LDLDIRECT in the last 72 hours. Thyroid Function Tests: No results for input(s): TSH, T4TOTAL, FREET4, T3FREE, THYROIDAB in the last 72 hours. Anemia Panel: Recent Labs    07/17/18 0159  FERRITIN 1,214*  TIBC 315  IRON 123  RETICCTPCT 1.3      Radiology Studies: I have reviewed all of the imaging during this hospital visit personally     Scheduled Meds: . enoxaparin (LOVENOX) injection  40 mg Subcutaneous Q24H  . feeding supplement (ENSURE ENLIVE)  237 mL Oral BID BM  . ibuprofen  400-800 mg Oral Q8H   Continuous  Infusions:   LOS: 1 day        Mauricio Gerome Apley, MD [facility] Pager [phone]

## 2018-07-18 ENCOUNTER — Encounter (HOSPITAL_COMMUNITY): Payer: Self-pay

## 2018-07-18 ENCOUNTER — Telehealth: Payer: Self-pay

## 2018-07-18 DIAGNOSIS — R945 Abnormal results of liver function studies: Secondary | ICD-10-CM

## 2018-07-18 DIAGNOSIS — K769 Liver disease, unspecified: Secondary | ICD-10-CM

## 2018-07-18 DIAGNOSIS — R5381 Other malaise: Secondary | ICD-10-CM

## 2018-07-18 DIAGNOSIS — R5383 Other fatigue: Secondary | ICD-10-CM

## 2018-07-18 DIAGNOSIS — B169 Acute hepatitis B without delta-agent and without hepatic coma: Principal | ICD-10-CM

## 2018-07-18 LAB — PROTIME-INR
INR: 1.27
Prothrombin Time: 15.7 seconds — ABNORMAL HIGH (ref 11.4–15.2)

## 2018-07-18 LAB — BASIC METABOLIC PANEL
Anion gap: 9 (ref 5–15)
BUN: <5 mg/dL — ABNORMAL LOW (ref 6–20)
CO2: 25 mmol/L (ref 22–32)
Calcium: 8.5 mg/dL — ABNORMAL LOW (ref 8.9–10.3)
Chloride: 103 mmol/L (ref 98–111)
Creatinine, Ser: 0.86 mg/dL (ref 0.44–1.00)
GFR calc Af Amer: >60 mL/min (ref >60)
GFR calc non Af Amer: >60 mL/min (ref >60)
Glucose, Bld: 87 mg/dL (ref 70–99)
Potassium: 3.6 mmol/L (ref 3.5–5.1)
Sodium: 137 mmol/L (ref 135–145)

## 2018-07-18 LAB — HEPATIC FUNCTION PANEL
ALT: 1689 U/L — ABNORMAL HIGH (ref 0–44)
AST: 1248 U/L — ABNORMAL HIGH (ref 15–41)
Albumin: 2.7 g/dL — ABNORMAL LOW (ref 3.5–5.0)
Alkaline Phosphatase: 169 U/L — ABNORMAL HIGH (ref 38–126)
Bilirubin, Direct: 5.4 mg/dL — ABNORMAL HIGH (ref 0.0–0.2)
Indirect Bilirubin: 3.3 mg/dL — ABNORMAL HIGH (ref 0.3–0.9)
Total Bilirubin: 8.7 mg/dL — ABNORMAL HIGH (ref 0.3–1.2)
Total Protein: 7.7 g/dL (ref 6.5–8.1)

## 2018-07-18 LAB — HEPATITIS B CORE ANTIBODY, IGM: Hep B C IgM: POSITIVE — AB

## 2018-07-18 MED ORDER — OXYCODONE-ACETAMINOPHEN 5-325 MG PO TABS: 1.0000 | ORAL_TABLET | Freq: Three times a day (TID) | ORAL | 0 refills | Status: DC | PRN

## 2018-07-18 NOTE — Progress Notes (Signed)
Kathryn Stevenson to be D/C'd  per MD order. Discussed with the patient and all questions fully answered.  VSS, Skin clean, dry and intact without evidence of skin break down, no evidence of skin tears noted.  IV catheter discontinued intact. Site without signs and symptoms of complications. Dressing and pressure applied.  An After Visit Summary was printed and given to the patient. Patient received prescription.  D/c education completed with patient/family including follow up instructions, medication list, d/c activities limitations if indicated, with other d/c instructions as indicated by MD - patient able to verbalize understanding, all questions fully answered.   Patient instructed to return to ED, call 911, or call MD for any changes in condition.   Patient to be escorted via [place], and D/C home via private auto.

## 2018-07-18 NOTE — Discharge Summary (Addendum)
Physician Discharge Summary  SHALAWN WYNDER RCV:[id number] DOB: [birth year] DOA: 07/13/2018  PCP: [name], FNP  Admit date: 07/13/2018 Discharge date: 07/18/2018  Admitted From: Home  Disposition:  Home   Recommendations for Outpatient Follow-up and new medication changes:  1. Follow up with [name] FNP, in 7 days.  2. Patient will follow up with GI on 07/22/2018 for liver panel and INR check.  3. Avoid hepatotoxic medications.  4. Hold on bupropion and avoid naproxen.  5. Patient was informed about being currently contagious, to avoid sharing toothbrushes, razors and to use barrier method when having sex.   Home Health: no   Equipment/Devices: no    Discharge Condition: stable  CODE STATUS: full  Diet recommendation: regular.   Brief/Interim Summary: 43 year old female who presented with malaise and dyspnea, she does have significant past medical history for obesity, GERD and hepatic hemangioma.  Reported 3 days of generalized fatigue, malaise, subjective fevers, chills, dyspnea and lightheadedness. She denied any abdominal pain.  On her initial physical examination blood pressure 110/78, heart rate 86, respiratory rate 19, temperature 100.6 F, oxygen saturation 100%.  She had moist mucous membranes, lungs clear to auscultation bilaterally, heart S1-S2 present and rhythmic, the abdomen was soft nontender, no lower extremity edema.  Her AST was 1683, ALT 1547, alkaline phosphatase 187, total bilirubin is 2.5, ammonia 69, INR 1.3, lipase 22.   Sodium 135, potassium 3.5, chloride 103, bicarbonate 2, glucose 135, BUN 5, creatinine 0.98, white count 6.3, hemoglobin 13.1, hematocrit 37.8,  platelets 230.  Urinalysis negative for infection, 30 protein, specific reality 1.038.  Chest x-ray with hypoinflation, no infiltrates. MRI of the abdomen showed a pancreatic lesion along the uncinate process associated with pathologic adenopathy.  EKG with normal sinus rhythm, normal axis normal  intervals.  Patient was admitted to the hospital with transaminitis, hyperbilirubinemia and impending liver failure.  1.  Acute hepatitis B infection.  Patient was admitted to the medical ward, she was placed on supportive medical therapy, further work-up revealed acute hepatitis B infection with positive hep B surface antigen, positive hep B E antigen, positive hep B core antibody IgM.  AST and ALT peaked at 2371 and 2426, at discharge 1248 and 1689 respectively, her discharge INR is 1.27, no clinical signs of encephalopathy.  Her alkaline phosphatase at discharge is 169, her total bilirubin is 8.7, it is expected the improvement of the total bilirubin the next few days.  Further work-up with abdominal ultrasonography showed normal parenchymal echogenicity, portal vein was patent on color Doppler imaging, positive hemangioma in the posterior right lobe.  Patient needs a follow-up liver function test in 7 days.  2.  Pancreatic uncinate lesion.  Lesion noted through abdominal MRI.  It is possible to be reactive to acute hepatitis, will need outpatient follow-up for possible endoscopic ultrasound or ERCP.  3.  Morbid obesity.  Her calculated BMI is 53.6, will need outpatient follow-up and lifestyle modifications.  4.  Headache.  Will avoid NSAIDs due to acute liver failure, patient will be discharged on short course of Percocet.  Discharge Diagnoses:  Principal Problem:   Liver dysfunction Active Problems:   Pancreatic lesion   Elevated LFTs   Malaise and fatigue    Discharge Instructions   Allergies as of 07/18/2018   No Known Allergies     Medication List    STOP taking these medications   buPROPion 150 MG 24 hr tablet Commonly known as:  WELLBUTRIN XL   minocycline 100 MG capsule  Commonly known as:  MINOCIN   naproxen 500 MG tablet Commonly known as:  NAPROSYN   pantoprazole 40 MG tablet Commonly known as:  PROTONIX     TAKE these medications   omeprazole 40 MG  capsule Commonly known as:  PRILOSEC Take 1 capsule (40 mg total) by mouth daily.   oxyCODONE-acetaminophen 5-325 MG tablet Commonly known as:  PERCOCET/ROXICET Take 1 tablet by mouth every 8 (eight) hours as needed for severe pain.       No Known Allergies  Consultations:  GI    Procedures/Studies: Dg Chest 2 View  Result Date: 07/13/2018 CLINICAL DATA:  Fever, chills, and shortness of breath for 4 days. EXAM: CHEST - 2 VIEW COMPARISON:  12/24/2016 FINDINGS: The heart size and mediastinal contours are within normal limits. Both lungs are clear. The visualized skeletal structures are unremarkable. IMPRESSION: Negative.  No active cardiopulmonary disease. Electronically Signed   By: Earle Gell M.D.   On: 07/13/2018 17:43   Mr Liver W Wo Contrast  Result Date: 07/13/2018 CLINICAL DATA:  Liver lesion enlargement, for definitive characterization. EXAM: MRI ABDOMEN WITHOUT AND WITH CONTRAST TECHNIQUE: Multiplanar multisequence MR imaging of the abdomen was performed both before and after the administration of intravenous contrast. CONTRAST:  9.5 cc Gadavist COMPARISON:  Abdominal ultrasound 01/02/2018 FINDINGS: Lower chest: Mild cardiomegaly. Hepatobiliary: In segment 7 of the liver there is a 3.0 by 2.0 cm T2 hyperintense lesion with sharp marginal definition, high T2 signal, and peripheral nodular discontinuous centripetal enhancement diagnostic of hepatic hemangioma. Gallbladder unremarkable. The biliary tree appears normal. No additional significant hepatobiliary findings. Pancreas: There is a 3.2 by 2.0 by 2.3 cm mass within or along the uncinate process of the pancreas as shown on image 92/1101, which is hypoenhancing on arterial phase images, isoenhancing on portal venous phase images, and slightly hyperenhancing relative to the pancreas on the delayed phase images. The lesion does not appear to invade adjacent structures including the SMV. Spleen:  Unremarkable Adrenals/Urinary Tract:   Unremarkable Stomach/Bowel: Unremarkable Vascular/Lymphatic: 2.0 cm peripancreatic lymph node, image 75/1101. 1.8 cm in short axis portacaval lymph node on image 23/8. Additional smaller confluent porta hepatis and celiac nodes are present along with small right gastric nodes. Small retroperitoneal lymph nodes are also present. Other:  No supplemental non-categorized findings. Musculoskeletal: Unremarkable IMPRESSION: 1. Although the hepatic lesion of concern is shown to represent a benign hepatic hemangioma, there is a concerning lesion measuring 3.2 by 2.0 by 2.3 cm in the pancreas along the uncinate process which demonstrates arterial phase hypoenhancement, portal venous phase isoenhancement, and mild hyperenhancement on the delayed phase. There is also pathologically enlarged adenopathy in the peripancreatic/porta hepatis region. The lesion is sharply defined and the delayed enhancement would be somewhat atypical for adenocarcinoma, but the constellation of findings indicates the need for tissue diagnosis as malignancy of the pancreas is a distinct possibility. 2. Mild cardiomegaly. Electronically Signed   By: Van Clines M.D.   On: 07/13/2018 15:30   US Abdomen Limited Ruq  Result Date: 07/14/2018 CLINICAL DATA:  Elevated LFTs. EXAM: ULTRASOUND ABDOMEN LIMITED RIGHT UPPER QUADRANT COMPARISON:  Abdominal MRI/MRCP yesterday. FINDINGS: Gallbladder: Only minimally distended, similar to prior MRCP. No gallstones or wall thickening visualized. No sonographic Murphy sign noted by sonographer. Common bile duct: Diameter: 3 mm, normal. Liver: Hemangioma in the posterior right lobe of the liver on prior exam is not well demonstrated sonographically. Within normal limits in parenchymal echogenicity. Portal vein is patent on color Doppler imaging with normal  direction of blood flow towards the liver. IMPRESSION: 1. No explanation for elevated LFTs.  No biliary dilatation. 2. Known hepatic hemangioma in the right  lobe is not well visualized on the current exam. Electronically Signed   By: Keith Rake M.D.   On: 07/14/2018 01:41       Subjective: Patient is feeling well, had mild nausea yesterday and today is having pelvic cramps related to her menstrual period. No chest pain, no dyspnea.   Discharge Exam: Vitals:   07/17/18 2311 07/18/18 0610  BP: 101/68 97/62  Pulse: 76 68  Resp:  18  Temp:  98.6 F (37 C)  SpO2: 98% 100%   Vitals:   07/17/18 1408 07/17/18 2222 07/17/18 2311 07/18/18 0610  BP: 93/72 (!) 95/49 101/68 97/62  Pulse: 65 64 76 68  Resp: 17 18  18   Temp: 98.3 F (36.8 C) 98.4 F (36.9 C)  98.6 F (37 C)  TempSrc: Oral Oral  Oral  SpO2: 100% 97% 98% 100%  Weight:      Height:        General: Not in pain or dyspnea  Neurology: Awake and alert, non focal  E ENT: no pallor, positive icterus, oral mucosa moist Cardiovascular: No JVD. S1-S2 present, rhythmic, no gallops, rubs, or murmurs. No lower extremity edema. Pulmonary: positive breath sounds bilaterally, adequate air movement, no wheezing, rhonchi or rales. Gastrointestinal. Abdomen protuberant with no organomegaly, no rebound or guarding. Mild tender in the pelvic region.  Skin. No rashes Musculoskeletal: no joint deformities   The results of significant diagnostics from this hospitalization (including imaging, microbiology, ancillary and laboratory) are listed below for reference.     Microbiology: Recent Results (from the past 240 hour(s))  Urine culture     Status: Abnormal   Collection Time: 07/13/18  4:30 PM  Result Value Ref Range Status   Specimen Description URINE, RANDOM  Final   Special Requests   Final    NONE Performed at [facility], [address] [place], [place] [address]    Culture <10,000 COLONIES/mL INSIGNIFICANT GROWTH (A)  Final   Report Status 07/14/2018 FINAL  Final     Labs: BNP (last 3 results) No results for input(s): BNP in the last 8760 hours. Basic Metabolic  Panel: Recent Labs  Lab 07/14/18 0050 07/15/18 0152 07/16/18 0419 07/17/18 0159 07/18/18 0344  NA 135 133* 133* 135 137  K 3.8 3.9 3.8 3.5 3.6  CL 104 105 102 103 103  CO2 24 19* 21* 21* 25  GLUCOSE 109* 94 85 109* 87  BUN <5* <5* <5* 6 <5*  CREATININE 0.95 0.82 0.76 0.74 0.86  CALCIUM 8.1* 8.1* 8.3* 8.7* 8.5*   Liver Function Tests: Recent Labs  Lab 07/14/18 0050 07/15/18 0152 07/16/18 0419 07/17/18 0159 07/18/18 0344  AST 1,716* 2,322* 2,371* 1,713* 1,248*  ALT 1,640* 2,202* 2,426* 2,104* 1,689*  ALKPHOS 183* 178* 153* 177* 169*  BILITOT 2.6* 3.8* 5.9* 6.7* 8.7*  PROT 6.7 6.7 6.8 7.1 7.7  ALBUMIN 3.0* 2.9* 2.7* 2.7* 2.7*   Recent Labs  Lab 07/13/18 1630  LIPASE 22   Recent Labs  Lab 07/13/18 1928 07/16/18 2224  AMMONIA 69* 51*   CBC: Recent Labs  Lab 07/13/18 1630 07/14/18 0050 07/15/18 0152  WBC 6.3 6.0 6.7  NEUTROABS 4.2  --  3.0  HGB 13.1 12.5 12.5  HCT 37.8 35.9* 35.7*  MCV 80.8 79.2* 78.5*  PLT 230 161 185   Cardiac Enzymes: No results  for input(s): CKTOTAL, CKMB, CKMBINDEX, TROPONINI in the last 168 hours. BNP: Invalid input(s): POCBNP CBG: No results for input(s): GLUCAP in the last 168 hours. D-Dimer No results for input(s): DDIMER in the last 72 hours. Hgb A1c No results for input(s): HGBA1C in the last 72 hours. Lipid Profile No results for input(s): CHOL, HDL, LDLCALC, TRIG, CHOLHDL, LDLDIRECT in the last 72 hours. Thyroid function studies No results for input(s): TSH, T4TOTAL, T3FREE, THYROIDAB in the last 72 hours.  Invalid input(s): FREET3 Anemia work up Recent Labs    07/17/18 0159  FERRITIN 1,214*  TIBC 315  IRON 123  RETICCTPCT 1.3   Urinalysis    Component Value Date/Time   COLORURINE AMBER (A) 07/13/2018 1630   APPEARANCEUR CLEAR 07/13/2018 1630   LABSPEC 1.038 (H) 07/13/2018 1630   PHURINE 5.0 07/13/2018 1630   GLUCOSEU NEGATIVE 07/13/2018 1630   HGBUR NEGATIVE 07/13/2018 1630   BILIRUBINUR MODERATE (A)  07/13/2018 1630   KETONESUR NEGATIVE 07/13/2018 1630   PROTEINUR 30 (A) 07/13/2018 1630   UROBILINOGEN 1.0 12/08/2016 1543   NITRITE NEGATIVE 07/13/2018 1630   LEUKOCYTESUR NEGATIVE 07/13/2018 1630   Sepsis Labs Invalid input(s): PROCALCITONIN,  WBC,  LACTICIDVEN Microbiology Recent Results (from the past 240 hour(s))  Urine culture     Status: Abnormal   Collection Time: 07/13/18  4:30 PM  Result Value Ref Range Status   Specimen Description URINE, RANDOM  Final   Special Requests   Final    NONE Performed at [facility], [place] [address], [place],  [address]    Culture <10,000 COLONIES/mL INSIGNIFICANT GROWTH (A)  Final   Report Status 07/14/2018 FINAL  Final     Time coordinating discharge: 45 minutes  SIGNED:   Tawni Millers, MD  [facility] 07/18/2018, 8:59 AM Pager [phone]  If 7PM-7AM, please contact night-coverage [email or web address] Password [email or web address]

## 2018-07-18 NOTE — Discharge Instructions (Signed)
Please send your short term disability paperwork to our partnering company so we can get this done for you.  Phone: [phone] Fax: [phone]  If you have any questions regarding this process please call number above, it you have further questions, don't hesitate to call our office. [facility].  You should be provided with a work note until February 10th upon discharge. You should also receive an excuse from [place] duty on 07/22/18.

## 2018-07-18 NOTE — Progress Notes (Signed)
Progress Note   Subjective  Chief Complaint: Acute hepatitis B  Today, patient tells me that she is feeling some better, she has had some bowel movements.  Tells me she is due to report for jury duty on Monday and will need a note for this and also asked about where to send her short-term disability paperwork.  Tells me she started her menstrual cycle today and does get a lot of cramping with this.  Asked if she can get a prescription of Percocet to go home with this she is not sure if she can use Midol.   Objective   Vital signs in last 24 hours: Temp:  [98.3 F (36.8 C)-98.6 F (37 C)] 98.6 F (37 C) (01/30 0610) Pulse Rate:  [64-76] 68 (01/30 0610) Resp:  [17-18] 18 (01/30 0610) BP: (93-101)/(49-72) 97/62 (01/30 0610) SpO2:  [97 %-100 %] 100 % (01/30 0610) Last BM Date: 07/16/18 General:  Obese AA female in NAD Heart:  Regular rate and rhythm; no murmurs Lungs: Respirations even and unlabored, lungs CTA bilaterally Abdomen:  Soft, nontender and nondistended. Normal bowel sounds. Extremities:  Without edema. Neurologic:  Alert and oriented,  grossly normal neurologically. Psych:  Cooperative. Normal mood and affect.  Intake/Output from previous day: 01/29 0701 - 01/30 0700 In: 1332 [P.O.:1332] Out: -   Lab Results: BMET Recent Labs    07/16/18 0419 07/17/18 0159 07/18/18 0344  NA 133* 135 137  K 3.8 3.5 3.6  CL 102 103 103  CO2 21* 21* 25  GLUCOSE 85 109* 87  BUN <5* 6 <5*  CREATININE 0.76 0.74 0.86  CALCIUM 8.3* 8.7* 8.5*   LFT Recent Labs    07/18/18 0344  PROT 7.7  ALBUMIN 2.7*  AST 1,248*  ALT 1,689*  ALKPHOS 169*  BILITOT 8.7*  BILIDIR 5.4*  IBILI 3.3*   PT/INR Recent Labs    07/17/18 0159 07/18/18 0344  LABPROT 16.1* 15.7*  INR 1.31 1.27    Assessment / Plan:   Assessment: 1.  Acute hepatitis B: Transaminases now trending down with bili increasing which can be normal, will need to be trended outpatient 2.  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  We will provide patient with information in regards to our short-term disability company.  This way she can send her paperwork there. Will put info on AVS. 2.  Okay with patient having a work note for the rest of this week and next week.  From there we can extend it if needed. 3.  Discussed with patient that at this point in time she is highly infective.  Advised her not to share toothbrushes or razors and to avoid sexual contact.  If she does have sex she should use a barrier method of contraception. 4.  Explained that 80% of the time the patient is able to beat this virus on their own within a few weeks to a month but we will continue to watch her liver enzymes closely and monitor how she is feeling. 5.  Patient will need to return to our outpatient clinic on Monday, 07/22/2018 for a hepatic function panel and INR.  She will also need follow-up in our outpatient clinic in 3 to 4 weeks or sooner as indicated by labs, will need to discuss EUS possibly in the future. Will try to schedule this with Dr. Loletha Carrow, her  primary GI physician. 6.  Patient does request some Percocet to go home with given her abdominal cramping with onset of menses and inability to use other medications.  This is okay per [place]. 7.  We are okay with discharge home today.  Thank you for your kind consultation.   LOS: 2 days   Levin Erp  07/18/2018, 10:15 AM

## 2018-07-18 NOTE — Telephone Encounter (Signed)
-----   Message from [place], [place] sent at 07/18/2018 10:32 AM EST ----- Regarding: labs and appt Needs Hepatic function panel and INR on Monday, 07/22/2018. (please have sent to Dr. Jena Gauss order with his name) She is to be discharged from the hospital today.  Can we call and remind her if this does not get done, it is very important.Also needs follow-up appointment with Dr. Loletha Carrow in 3-4 weeks for abnormal pancreatic imaging and acute hep B  Thanks, Anderson Malta

## 2018-07-18 NOTE — Telephone Encounter (Signed)
Labs entered Follow up arranged for 08/16/18 1:30.  Letter mailed.

## 2018-07-22 ENCOUNTER — Other Ambulatory Visit (INDEPENDENT_AMBULATORY_CARE_PROVIDER_SITE_OTHER): Payer: 59

## 2018-07-22 DIAGNOSIS — K769 Liver disease, unspecified: Secondary | ICD-10-CM | POA: Diagnosis not present

## 2018-07-22 LAB — PROTIME-INR
INR: 1.4 ratio — ABNORMAL HIGH (ref 0.8–1.0)
Prothrombin Time: 16.3 s — ABNORMAL HIGH (ref 9.6–13.1)

## 2018-07-22 LAB — HEPATIC FUNCTION PANEL
ALT: 1670 U/L — ABNORMAL HIGH (ref 0–35)
AST: 2054 U/L — ABNORMAL HIGH (ref 0–37)
Albumin: 3.3 g/dL — ABNORMAL LOW (ref 3.5–5.2)
Alkaline Phosphatase: 187 U/L — ABNORMAL HIGH (ref 39–117)
Bilirubin, Direct: 8.1 mg/dL — ABNORMAL HIGH (ref 0.0–0.3)
Total Bilirubin: 12.6 mg/dL — ABNORMAL HIGH (ref 0.2–1.2)
Total Protein: 7.9 g/dL (ref 6.0–8.3)

## 2018-07-23 ENCOUNTER — Encounter (HOSPITAL_COMMUNITY): Payer: Self-pay

## 2018-07-23 ENCOUNTER — Other Ambulatory Visit: Payer: Self-pay

## 2018-07-23 ENCOUNTER — Emergency Department (HOSPITAL_COMMUNITY)
Admission: EM | Admit: 2018-07-23 | Discharge: 2018-07-23 | Disposition: A | Discharge status: Home / Self Care | Payer: 59 | Attending: Emergency Medicine | Admitting: Emergency Medicine

## 2018-07-23 DIAGNOSIS — B1910 Unspecified viral hepatitis B without hepatic coma: Secondary | ICD-10-CM

## 2018-07-23 DIAGNOSIS — Z5321 Procedure and treatment not carried out due to patient leaving prior to being seen by health care provider: Secondary | ICD-10-CM | POA: Insufficient documentation

## 2018-07-23 DIAGNOSIS — R109 Unspecified abdominal pain: Secondary | ICD-10-CM | POA: Diagnosis present

## 2018-07-23 NOTE — ED Triage Notes (Signed)
Pt presents for evaluation of weakness and abd pain. States d/c from hospital on 1/30 for hepatitis B. Has elevated liver enzymes again per PCP. Pt declining blood work at this time since she had it done yesterday.

## 2018-07-23 NOTE — ED Notes (Signed)
No answer for treatment room. 

## 2018-07-24 ENCOUNTER — Encounter: Payer: Self-pay | Admitting: Family Medicine

## 2018-07-24 ENCOUNTER — Ambulatory Visit (INDEPENDENT_AMBULATORY_CARE_PROVIDER_SITE_OTHER): Payer: 59 | Admitting: Family Medicine

## 2018-07-24 VITALS — BP 107/67 | HR 79 | Temp 98.6°F | Resp 16 | Ht 66.0 in | Wt 323.0 lb

## 2018-07-24 DIAGNOSIS — R7989 Other specified abnormal findings of blood chemistry: Secondary | ICD-10-CM

## 2018-07-24 DIAGNOSIS — R1011 Right upper quadrant pain: Secondary | ICD-10-CM | POA: Diagnosis not present

## 2018-07-24 DIAGNOSIS — R945 Abnormal results of liver function studies: Secondary | ICD-10-CM | POA: Diagnosis not present

## 2018-07-24 NOTE — Progress Notes (Signed)
Patient [name]   Progress Note: General Provider: [name], FNP  SUBJECTIVE:   Kathryn Stevenson is a 43 y.o. female who  has a past medical history of Acid reflux, Dermatitis, Fracture, ankle, GERD (gastroesophageal reflux disease), Hepatic hemangioma, Hepatitis B, Morbid obesity ([place]), and Pancreatic lesion (06/2018).. Patient presents today for Hospitalization Follow-up (hep B ) Patient states that she was called and told to go to the hospital due to increased liver enzymes. Patient states that she went to the ED, stayed for 4 hours and left without being seen. She states that she feels severely fatigued, has a decreased appetite and has increased reflux.  She does not want to go to the ED today.  She currently denies chest pain, shortness of breath, dizziness, or leg swelling.  She does endorse abdominal pain to the upper right quadrant.  Her liver enzymes were increased per chart review.   Review of Systems  Constitutional: Positive for malaise/fatigue.  HENT: Negative.   Eyes: Negative.   Respiratory: Negative.   Cardiovascular: Negative.   Gastrointestinal: Positive for abdominal pain.  Genitourinary: Negative.   Musculoskeletal: Negative.   Skin: Negative.   Neurological: Negative.   Psychiatric/Behavioral: Negative.      OBJECTIVE: BP 107/67 (BP Location: Right Arm, Patient Position: Sitting, Cuff Size: Large)   Pulse 79   Temp 98.6 F (37 C) (Oral)   Resp 16   Ht 5\' 6"  (1.676 m)   Wt (!) 323 lb (146.5 kg)   LMP 07/17/2018   SpO2 99%   BMI 52.13 kg/m   Wt Readings from Last 3 Encounters:  07/25/18 (!) 352 lb (159.7 kg)  07/24/18 (!) 323 lb (146.5 kg)  07/14/18 (!) 332 lb 7.6 oz (150.8 kg)     Physical Exam Vitals signs and nursing note reviewed.  Constitutional:      General: She is not in acute distress.    Appearance: She is well-developed.  HENT:     Head: Normocephalic and atraumatic.  Eyes:   Conjunctiva/sclera: Conjunctivae normal.     Pupils: Pupils are equal, round, and reactive to light.  Neck:     Musculoskeletal: Normal range of motion.  Cardiovascular:     Rate and Rhythm: Normal rate and regular rhythm.     Heart sounds: Normal heart sounds.  Pulmonary:     Effort: Pulmonary effort is normal. No respiratory distress.     Breath sounds: Normal breath sounds.  Abdominal:     General: Bowel sounds are normal. There is no distension.     Palpations: Abdomen is soft.     Tenderness: There is abdominal tenderness (upper right and left quadrants. ).  Musculoskeletal: Normal range of motion.  Skin:    General: Skin is warm and dry.  Neurological:     Mental Status: She is alert and oriented to person, place, and time.  Psychiatric:        Behavior: Behavior normal.        Thought Content: Thought content normal.     ASSESSMENT/PLAN:  1. Elevated LFTs 2. RUQ abdominal pain Patient refusing to go to the ER today.  Called and spoke with gastroenterology's office.  Patient given appointment to follow-up with that office tomorrow. Patient advised to seek immediate emergency assistance if fever, chill,diarrhea, nausea, vomiting or inability to void occurs.  . The patient verbalized understanding and agreed with plan of care.    Time Spent: 25 minutes face-to-face with this patient  discussing problems, treatments, and answering patient's questions.      Return if symptoms worsen or fail to improve.    The patient was given clear instructions to go to ER or return to medical center if symptoms do not improve, worsen or new problems develop. The patient verbalized understanding and agreed with plan of care.   [name], FNP-BC Patient [facility] [address] [place], [place] [address] [phone]

## 2018-07-25 ENCOUNTER — Ambulatory Visit (INDEPENDENT_AMBULATORY_CARE_PROVIDER_SITE_OTHER): Payer: 59 | Admitting: Nurse Practitioner

## 2018-07-25 ENCOUNTER — Other Ambulatory Visit (INDEPENDENT_AMBULATORY_CARE_PROVIDER_SITE_OTHER): Payer: 59

## 2018-07-25 ENCOUNTER — Encounter: Payer: Self-pay | Admitting: Nurse Practitioner

## 2018-07-25 VITALS — BP 118/78 | HR 78 | Ht 66.0 in | Wt 352.0 lb

## 2018-07-25 DIAGNOSIS — B169 Acute hepatitis B without delta-agent and without hepatic coma: Secondary | ICD-10-CM | POA: Diagnosis not present

## 2018-07-25 DIAGNOSIS — K219 Gastro-esophageal reflux disease without esophagitis: Secondary | ICD-10-CM | POA: Diagnosis not present

## 2018-07-25 DIAGNOSIS — K869 Disease of pancreas, unspecified: Secondary | ICD-10-CM

## 2018-07-25 LAB — CBC
HCT: 37.4 % (ref 36.0–46.0)
Hemoglobin: 12.8 g/dL (ref 12.0–15.0)
MCHC: 34.2 g/dL (ref 30.0–36.0)
MCV: 82.1 fl (ref 78.0–100.0)
Platelets: 352 10*3/uL (ref 150.0–400.0)
RBC: 4.56 Mil/uL (ref 3.87–5.11)
RDW: 16.5 % — ABNORMAL HIGH (ref 11.5–15.5)
WBC: 7.9 10*3/uL (ref 4.0–10.5)

## 2018-07-25 LAB — PROTIME-INR
INR: 1.3 ratio — ABNORMAL HIGH (ref 0.8–1.0)
Prothrombin Time: 14.7 s — ABNORMAL HIGH (ref 9.6–13.1)

## 2018-07-25 LAB — HEPATIC FUNCTION PANEL
ALT: 862 U/L — ABNORMAL HIGH (ref 0–35)
AST: 826 U/L — ABNORMAL HIGH (ref 0–37)
Albumin: 3.6 g/dL (ref 3.5–5.2)
Alkaline Phosphatase: 184 U/L — ABNORMAL HIGH (ref 39–117)
Bilirubin, Direct: 6.2 mg/dL — ABNORMAL HIGH (ref 0.0–0.3)
Total Bilirubin: 10.5 mg/dL — ABNORMAL HIGH (ref 0.2–1.2)
Total Protein: 7.9 g/dL (ref 6.0–8.3)

## 2018-07-25 NOTE — Patient Instructions (Signed)
If you are age 44 or older, your body mass index should be between 23-30. Your Body mass index is 56.81 kg/m. If this is out of the aforementioned range listed, please consider follow up with your Primary Care Provider.  If you are age 4 or younger, your body mass index should be between 19-25. Your Body mass index is 56.81 kg/m. If this is out of the aformentioned range listed, please consider follow up with your Primary Care Provider.   Your provider has requested that you go to the basement level for lab work before leaving today. Press "B" on the elevator. The lab is located at the first door on the left as you exit the elevator.  We will call you with results.  Can start Prilosec.  Thank you for choosing me and [facility].   [name], NP

## 2018-07-25 NOTE — Progress Notes (Signed)
Chief Complaint:   Follow up on acute hepatitis B    IMPRESSION and PLAN:    78.  43 year old female recently hospitalized with acute hepatitis B. Slight worsening of AST and Tbili up from 8 to 12 over last few days. INR also up slightly from 1.27 to 1.4. No corresponding mental status changes. ALT still elevated but stable.  -not surprising that bilirubin still rising at this point. Mild rise in INR more of concern. However, she looks and feels okay today. No mental status changes at home per family.  -repeat liver tests today. Will need to follow them closely for now.  -I don't see that Hepaitis D antibody was obtain.  -Holding off on HBV treatment for now, hopefully she will seroconvert soon.  -Reminded family member that patient needs to monitor closely for any behavior / mental status changes and we should be notified or go to ED immediately should this occur.    2. Uncinated pancreatic lesionPancreatic uncinate lesion with porta hepatis adenopathy.  ? Is adenopathy reactive to acute hepatitis or related to this pancreatic lesion. No plans for immediate EUS (+/- ERCP) but Dr Ardis Hughs or Mansouraty will pursue as outpt.   Hyperferritinemia, likely secondary to acute hepatitis.   3. Uncinate process lesion with associated adenopathy on MRI.  -May need EUS. Will discuss with Dr. Loletha Carrow patient's primary GI  4. GERD.  -having significant symptoms off treatment. She can restart PPI now  HPI:     Patient is a 43 yo female recently hospitalized several days for weakness, fevers, and markedly abnormal liver tests. Testing revealed acute HBV infection.  The HBV IgM and HBV E ag were positive. HIV negative. Initially AST 1683, ALT 1547. Enzymes peaked at 2371 / 2426 respectively then started slow downward trend. At time of discharge AST was 1248 / ALT 1689, T.bili 3.3. MRI of liver was revealing of a hepatic hemangioma but incidentally found a lesion along the uncinate process and  abnormally enlarged lymph notes in the peripancreatic / porta hepatis region.   Liver MRI  07/13/18    IMPRESSION: 1. Although the hepatic lesion of concern is shown to represent a benign hepatic hemangioma, there is a concerning lesion measuring 3.2 by 2.0 by 2.3 cm in the pancreas along the uncinate process which demonstrates arterial phase hypoenhancement, portal venous phase isoenhancement, and mild hyperenhancement on the delayed phase. There is also pathologically enlarged adenopathy in the peripancreatic/porta hepatis region. The lesion is sharply defined and the delayed enhancement would be somewhat atypical for adenocarcinoma, but the constellation of findings indicates the need for tissue diagnosis as malignancy of the pancreas is a distinct possibility. 2. Mild cardiomegaly.  Following hospital discharge on 07/18/18 patient came for follow up labs on 07/22/18 as recommended. She complained of recurrent fatigue, chills, and vomiting all corresponding to a slgiht worsening of liver labs. She went to ED upon our advice but didn't stay for completion of visit. She was worked in to see me today. Since hospital discharge she has taken any meds, vitamins, ETOH.    Kathryn Stevenson feels okay some days, other days has fatigue. Today she actually feels okay. Her only complaint is that having been advised to stop all meds including GERD meds she is having significant GERD symptoms.She has epigastric discomfort and chest discomfort which she describes as GERD sx. No recent fevers. Her urine is dark. Family member with her today, hasn't noticed any excessive drowsiness / behavior changes.  Review of systems:     No chest pain, no SOB, no fevers, no urinary sx   Past Medical History:  Diagnosis Date  . Acid reflux   . Dermatitis   . Fracture, ankle   . Liver lesion    noncancerous  . Morbid obesity ([place])     Patient's surgical history, family medical history, social history, medications and  allergies were all reviewed in Epic   Serum creatinine: 0.86 mg/dL 07/18/18 0344 Estimated creatinine clearance: 132.5 mL/min  Current Outpatient Medications  Medication Sig Dispense Refill  . omeprazole (PRILOSEC) 40 MG capsule Take 1 capsule (40 mg total) by mouth daily. (Patient not taking: Reported on 07/24/2018) 30 capsule 1  . oxyCODONE-acetaminophen (PERCOCET/ROXICET) 5-325 MG tablet Take 1 tablet by mouth every 8 (eight) hours as needed for severe pain. (Patient not taking: Reported on 07/24/2018) 10 tablet 0   No current facility-administered medications for this visit.     Physical Exam:     BP 118/78   Pulse 78   Ht 5\' 6"  (1.676 m)   Wt (!) 352 lb (159.7 kg)   LMP 07/17/2018   BMI 56.81 kg/m   GENERAL:  Pleasant female in NAD PSYCH: : Cooperative, normal affect EENT:  conjunctiva pink, mucous membranes moist, neck supple without masses CARDIAC:  RRR, no murmur heard, no peripheral edema PULM: Normal respiratory effort, lungs CTA bilaterally, no wheezing ABDOMEN:  Nondistended, soft, nontender. No obvious masses, no hepatomegaly,  normal bowel sounds SKIN:  turgor, no lesions seen Musculoskeletal:  Normal muscle tone, normal strength NEURO: Alert and oriented x 3, no focal neurologic deficits   Kathryn Stevenson , NP 07/25/2018, 2:12 PM

## 2018-07-26 ENCOUNTER — Other Ambulatory Visit: Payer: Self-pay

## 2018-07-26 DIAGNOSIS — B169 Acute hepatitis B without delta-agent and without hepatic coma: Secondary | ICD-10-CM

## 2018-07-27 ENCOUNTER — Encounter: Payer: Self-pay | Admitting: Nurse Practitioner

## 2018-07-28 NOTE — Progress Notes (Signed)
____________________________________________________________  Attending physician addendum:  Thank you for sending this case to me. I have reviewed the entire note, and the outlined plan seems appropriate.  As you indicated in result note of 2/6, LFTs improving and INR stabilized.  You mentioned Hepatitis delta, but I am not certain how to see if it was drawn and still pending.  If not, please do so with labs this week. She is scheduled with me end of month and we can readdress the abnormal imaging finding.  Hopefully she will seroconvert from the HBV.  Wilfrid Lund, MD  ____________________________________________________________

## 2018-07-29 ENCOUNTER — Other Ambulatory Visit: Payer: Self-pay | Admitting: Nurse Practitioner

## 2018-07-29 DIAGNOSIS — B169 Acute hepatitis B without delta-agent and without hepatic coma: Secondary | ICD-10-CM

## 2018-07-31 ENCOUNTER — Ambulatory Visit: Payer: Medicaid Other | Admitting: Nurse Practitioner

## 2018-08-01 ENCOUNTER — Other Ambulatory Visit (INDEPENDENT_AMBULATORY_CARE_PROVIDER_SITE_OTHER): Payer: 59

## 2018-08-01 DIAGNOSIS — B169 Acute hepatitis B without delta-agent and without hepatic coma: Secondary | ICD-10-CM | POA: Diagnosis not present

## 2018-08-01 LAB — HEPATIC FUNCTION PANEL
ALT: 491 U/L — ABNORMAL HIGH (ref 0–35)
AST: 579 U/L — ABNORMAL HIGH (ref 0–37)
Albumin: 3.5 g/dL (ref 3.5–5.2)
Alkaline Phosphatase: 135 U/L — ABNORMAL HIGH (ref 39–117)
Bilirubin, Direct: 3.5 mg/dL — ABNORMAL HIGH (ref 0.0–0.3)
Total Bilirubin: 6 mg/dL — ABNORMAL HIGH (ref 0.2–1.2)
Total Protein: 7.1 g/dL (ref 6.0–8.3)

## 2018-08-01 LAB — PROTIME-INR
INR: 1.2 ratio — ABNORMAL HIGH (ref 0.8–1.0)
Prothrombin Time: 13.7 s — ABNORMAL HIGH (ref 9.6–13.1)

## 2018-08-02 ENCOUNTER — Ambulatory Visit: Payer: Medicaid Other | Admitting: Family Medicine

## 2018-08-02 NOTE — Addendum Note (Signed)
Addended by: Virgina Evener A on: 08/02/2018 10:45 AM   Modules accepted: Orders

## 2018-08-07 LAB — HEPATITIS DELTA ANTIBODY: Hepatitis D Ab, Total: NEGATIVE

## 2018-08-08 ENCOUNTER — Telehealth: Payer: Self-pay | Admitting: Nurse Practitioner

## 2018-08-08 ENCOUNTER — Other Ambulatory Visit (INDEPENDENT_AMBULATORY_CARE_PROVIDER_SITE_OTHER): Payer: 59

## 2018-08-08 DIAGNOSIS — B169 Acute hepatitis B without delta-agent and without hepatic coma: Secondary | ICD-10-CM

## 2018-08-08 LAB — HEPATIC FUNCTION PANEL
ALT: 235 U/L — ABNORMAL HIGH (ref 0–35)
AST: 212 U/L — ABNORMAL HIGH (ref 0–37)
Albumin: 3.8 g/dL (ref 3.5–5.2)
Alkaline Phosphatase: 154 U/L — ABNORMAL HIGH (ref 39–117)
Bilirubin, Direct: 1.8 mg/dL — ABNORMAL HIGH (ref 0.0–0.3)
Total Bilirubin: 3.4 mg/dL — ABNORMAL HIGH (ref 0.2–1.2)
Total Protein: 7.6 g/dL (ref 6.0–8.3)

## 2018-08-08 LAB — PROTIME-INR
INR: 1.1 ratio — ABNORMAL HIGH (ref 0.8–1.0)
Prothrombin Time: 13.2 s — ABNORMAL HIGH (ref 9.6–13.1)

## 2018-08-08 NOTE — Telephone Encounter (Signed)
Called and spoke to [place] at [employer]. She states the paperwork was sent innta-office Monday 08-05-2018. Folder is not been yet received here at the office. Eri faxed the records to the nurse fax and are on Danis' desk for signature. Pt has been notified.

## 2018-08-08 NOTE — Telephone Encounter (Signed)
I do not have them. Perhaps they went to primary care, which is the usual route for [facility] paperwork.

## 2018-08-08 NOTE — Telephone Encounter (Signed)
I do not get FMLA papers. Hope you can help.

## 2018-08-09 NOTE — Telephone Encounter (Signed)
FMLA papers done.Can be sent to employer or picked up by patient, depending on her preference.  She can return to work 08/12/18.  No wound care/dressing changes until further notice.  Clinic visit as scheduled.

## 2018-08-12 ENCOUNTER — Telehealth: Payer: Self-pay | Admitting: Gastroenterology

## 2018-08-12 NOTE — Telephone Encounter (Signed)
Liver labs improving.  Will discuss further at [place] later this week.

## 2018-08-12 NOTE — Telephone Encounter (Signed)
Pt called wanting results of labs

## 2018-08-12 NOTE — Telephone Encounter (Signed)
Pt aware.

## 2018-08-12 NOTE — Telephone Encounter (Signed)
Pt requesting lab results, please advise.

## 2018-08-14 ENCOUNTER — Telehealth: Payer: Self-pay | Admitting: Gastroenterology

## 2018-08-14 NOTE — Telephone Encounter (Signed)
Unknown - best not to chance it. Definitely do not take anything with acetominophen (tylenol) until liver disease improves,

## 2018-08-14 NOTE — Telephone Encounter (Signed)
Can patient use Midol with liver disease?

## 2018-08-14 NOTE — Telephone Encounter (Signed)
Pt called and wants to know if it is ok for her to take the midol???

## 2018-08-14 NOTE — Telephone Encounter (Signed)
talked to Dr Loletha Carrow, advised she could take a few doses of ibuprofen and she agrees to avoid any acetaminophen products.

## 2018-08-14 NOTE — Telephone Encounter (Signed)
Correct

## 2018-08-14 NOTE — Telephone Encounter (Signed)
midol does have tylenol. Pt wants to know what if anything can she take for menstrual cramps and pain. Please advise

## 2018-08-15 NOTE — Patient Instructions (Signed)
Liver Function Tests Why am I having this test? Liver function tests are done to see how well your liver is working. The proteins and enzymes measured in the test can alert your health care provider to inflammation, damage, or disease in your liver. It is common to have liver function tests:  When you are taking certain medicines.  If you have liver disease.  If you drink a lot of alcohol.  When you are not feeling well.  When you have other conditions that may affect your liver.  During annual physical exams.  If you have symptoms such as yellowing of the skin (jaundice), abdominal pain, or nausea and vomiting. What is being tested? These tests measure various substances in your blood. This may include:  Alanine transaminase (ALT). This is an enzyme in the liver.  Aspartate transaminase (AST). This is an enzyme in the liver, heart, and muscles.  Alkaline phosphatase (ALP). This is a protein in the liver, bile ducts, bone, and other body tissues.  Total bilirubin. This is a yellow pigment in bile.  Albumin. This is a protein in the liver.  Prothrombin time and international normalized ratio (PT and INR). PT measures the time it takes for your blood to clot. INR is a calculation of blood clotting time based on your PT result. It is also calculated based on normal ranges defined by the lab that processed your test.  Total protein. This includes two proteins, albumin and globulin, found in the blood. What kind of sample is taken?  A blood sample is required for this test. It is usually collected by inserting a needle into a blood vessel. How do I prepare for this test? How you prepare will depend on which tests are being done and the reason for doing them. You may need to:  Avoid eating for 4-6 hours before the test, or as told by your health care provider.  Stop taking certain medicines before your blood test, as told by your health care provider. Tell a health care provider  about:  All medicines you are taking, including vitamins, herbs, eye drops, creams, and over-the-counter medicines.  Any medical conditions you have.  Whether you are pregnant or may be pregnant. How are the results reported? Your test results will be reported as values. Your health care provider will compare your results to normal ranges that were established after testing a large group of people (reference ranges). Reference ranges may vary among labs and hospitals. For the substances measured in liver function tests, common reference ranges are: ALT  Infant: 10-40 international units/L.  Child or adult: 4-36 international units/L at 37C or 4-36 units/L (SI units).  Reference ranges may be higher for older adults. AST  Newborn 0-5 days old: 35-140 units/L.  Child younger than 3 years old: 15-60 units/L.  3-6 years old: 15-50 units/L.  6-12 years old: 10-50 units/L.  12-18 years old: 10-40 units/L.  Adult: 0-35 units/L or 0-0.58 ?kat/L (SI units).  Reference ranges may be higher for older adults. ALP  Child younger than 2 years old: 85-235 units/L.  2-8 years old: 65-210 units/L.  9-15 years old: 60-300 units/L.  16-21 years old: 30-200 units/L.  Adult: 30-120 units/L or 0.5-2.0 ?kat/L (SI units).  Reference ranges may be higher for older adults. Total bilirubin  Newborn: 1.0-12.0 mg/dL or 17.1-205 ?mol/L (SI units).  Child or adult: 0.3-1.0 mg/dL or 5.1-17 ?mol/L. Albumin  Premature infant: 3.0-4.2 g/dL.  Newborn: 3.5-5.4 g/dL.  Infant: 4.4-5.4 g/dL.  Child:   4.0-5.9 g/dL.  Adult: 3.5-5.0 g/dL or 35-50 g/L (SI units). PT  11.0-12.5 seconds; 85%-100%. INR  0.8-1.1. Total protein  Premature infant: 4.2-7.6 g/dL.  Newborn: 4.6-7.4 g/dL.  Infant: 6.0-6.7 g/dL.  Child: 6.2-8.0 g/dL.  Adult: 6.4-8.3 g/dL or 64-83 g/L (SI units). What do the results mean? Results that are within the reference ranges are considered normal. For each substance  measured, results outside the reference range can indicate various health issues. ALT  Levels above the normal range may indicate liver disease. AST  Levels above the normal range may indicate liver disease. Sometimes levels also increase after burns, surgery, heart attack, muscle damage, or seizure. ALP  Levels above the normal range may be seen in biliary obstruction, liver diseases, bone disease, thyroid disease, tumors, fractures, leukemia, lymphoma, or several other conditions. People with blood type O or B may show higher levels after a fatty meal.  Levels below the normal range may indicate bone and teeth conditions, malnutrition, protein deficiency, or Wilson's disease. Total bilirubin  Levels above the normal range may indicate problems with the liver, gallbladder, or bile ducts. Albumin  Levels above the normal range may indicate dehydration. They may also be caused by a diet that is high in protein.  Levels below the normal range may indicate kidney disease, liver disease, or malabsorption of nutrients. PT and INR  Levels above the normal range mean that your blood is clotting slower than normal. This may be due to blood disorders, liver disorders, or low levels of vitamin K. Total protein  Levels above the normal range may be due to infection or other diseases.  Levels below the normal range may be due to an immune system disorder, bleeding, burns, kidney disorder, liver disease, trouble absorbing or getting nutrients, or other conditions that affect the intestines. Talk with your health care provider about what your results mean. Questions to ask your health care provider Ask your health care provider, or the department that is doing the test:  When will my results be ready?  How will I get my results?  What are my treatment options?  What other tests do I need?  What are my next steps? Summary  Liver function tests are done to see how well your liver is  working.  These tests measure various proteins and enzymes in your blood. The results can alert your health care provider to inflammation, damage, or disease in your liver.  Talk with your health care provider about what your results mean. This information is not intended to replace advice given to you by your health care provider. Make sure you discuss any questions you have with your health care provider. Document Released: 07/08/2004 Document Revised: 03/20/2017 Document Reviewed: 03/20/2017 Elsevier Interactive Patient Education  2019 [employer]  

## 2018-08-16 ENCOUNTER — Encounter: Payer: Self-pay | Admitting: Gastroenterology

## 2018-08-16 ENCOUNTER — Ambulatory Visit (INDEPENDENT_AMBULATORY_CARE_PROVIDER_SITE_OTHER): Payer: 59 | Admitting: Gastroenterology

## 2018-08-16 VITALS — BP 122/76 | HR 82 | Ht 66.0 in | Wt 321.5 lb

## 2018-08-16 DIAGNOSIS — R17 Unspecified jaundice: Secondary | ICD-10-CM

## 2018-08-16 DIAGNOSIS — R933 Abnormal findings on diagnostic imaging of other parts of digestive tract: Secondary | ICD-10-CM

## 2018-08-16 DIAGNOSIS — B169 Acute hepatitis B without delta-agent and without hepatic coma: Secondary | ICD-10-CM | POA: Diagnosis not present

## 2018-08-16 NOTE — Progress Notes (Signed)
[facility] Progress Note  Chief Complaint: Acute hepatitis B with jaundice  Subjective  History:  This is a 43 year old woman known to me from outpatient evaluation 2 years ago, when she had right upper quadrant pain and an ultrasound showing questionable gallbladder wall thickening with a right lobe hemangioma.  HIDA scan was normal, EGD was advised.  Patient gave that further consideration and ultimately decided not to pursue EGD.  Follow-up ultrasound studies showed mild increase in size of the hemangioma.   She was recently admitted to hospital with cholestatic hepatitis found to have acute hepatitis B. LFTs and INR have been slowly improving.  The patient had profound malaise, nausea and anorexia.  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  I felt she could return to work earlier this week. MRI during recent admission confirmed that the right hepatic lesion is a hemangioma.  However, there was also a 3 x 2 cm lesion in or adjacent to the uncinate process of the pancreas with an enlarged lymph node felt to be pathologic.  She is feeling much better over the last 2 weeks.  Her nausea has resolved, appetite has returned.  She had lost about 25 pounds over the course of this illness in the first few weeks. Still having some occasional headaches, requested refill of oxycodone and I referred her to primary care for that.  ROS: Cardiovascular:  no chest pain Respiratory: no dyspnea Remainder of systems negative except as above The patient's Past Medical, Family and Social History were reviewed and are on file in the EMR.  Objective:  Med list reviewed  Current Outpatient Medications:  .  ibuprofen (ADVIL,MOTRIN) 200 MG tablet, Take 200 mg by mouth every 6 (six) hours as needed., Disp: , Rfl:  .  oxyCODONE-acetaminophen (PERCOCET/ROXICET) 5-325 MG tablet, Take 1 tablet by mouth every 8 (eight) hours as needed  for severe pain., Disp: 10 tablet, Rfl: 0 .  pantoprazole (PROTONIX) 40 MG tablet, Take 40 mg by mouth daily., Disp: , Rfl:    Vital signs in last 24 hrs: Vitals:   08/16/18 1318  BP: 122/76  Pulse: 82    Physical Exam  She is well-appearing  HEENT: sclera mildly icteric, oral mucosa moist without lesions  Neck: supple, no thyromegaly, JVD or lymphadenopathy  Cardiac: RRR without murmurs, S1S2 heard, no peripheral edema  Pulm: clear to auscultation bilaterally, normal RR and effort noted  Abdomen: soft, no tenderness, with active bowel sounds. No guarding or palpable hepatosplenomegaly, though limited by body habitus.  Skin; warm and dry, no appreciable jaundice or rash  Recent Labs:  CMP Latest Ref Rng & Units 08/08/2018 08/01/2018 07/25/2018  Glucose 70 - 99 mg/dL - - -  BUN 6 - 20 mg/dL - - -  Creatinine 0.44 - 1.00 mg/dL - - -  Sodium 135 - 145 mmol/L - - -  Potassium 3.5 - 5.1 mmol/L - - -  Chloride 98 - 111 mmol/L - - -  CO2 22 - 32 mmol/L - - -  Calcium 8.9 - 10.3 mg/dL - - -  Total Protein 6.0 - 8.3 g/dL 7.6 7.1 7.9  Total Bilirubin 0.2 - 1.2 mg/dL 3.4(H) 6.0(H) 10.5(H)  Alkaline Phos 39 - 117 U/L 154(H) 135(H) 184(H)  AST 0 - 37 U/L 212(H) 579(H) 826(H)  ALT 0 - 35 U/L 235(H) 491(H) 862(H)   On 07/18/2018, AST and  ALT about 1316 100 respectively, alkaline phosphatase 169, total bilirubin 8.7. LFTs peaked on 07/22/2018 with AST over 2000, ALT approaching 1700, total bilirubin 12.6 INR peaked at 1.4 on 07/22/2018, back to 1.1 on 08/08/18   Hepatitis B surface antigen and IgM core antibody positive, surface antibody negative E antigen positive. Hepatitis delta antibody negative Hepatitis C antibody and hepatitis A IgM both negative HIV antibody negative  Radiologic studies:  Recent ultrasound report on file  MRI/MRCP report reviewed: " IMPRESSION: 1. Although the hepatic lesion of concern is shown to represent a benign hepatic hemangioma, there is a  concerning lesion measuring 3.2 by 2.0 by 2.3 cm in the pancreas along the uncinate process which demonstrates arterial phase hypoenhancement, portal venous phase isoenhancement, and mild hyperenhancement on the delayed phase. There is also pathologically enlarged adenopathy in the peripancreatic/porta hepatis region. The lesion is sharply defined and the delayed enhancement would be somewhat atypical for adenocarcinoma, but the constellation of findings indicates the need for tissue diagnosis as malignancy of the pancreas is a distinct possibility. 2. Mild cardiomegaly."      @ASSESSMENTPLANBEGIN @ Assessment: Encounter Diagnoses  Name Primary?  . Jaundice Yes  . Acute hepatitis B   . Abnormal finding on GI tract imaging     We discussed the natural history of acute hepatitis B, and how she will most likely not develop chronic infection.  She suspects that she contracted it from a sexual partner who apparently was contacted after Kathryn Stevenson gave information to the health department, but she thinks they may not be disclosing the results to her. She is ready to go back to work and is anxious to do so.  Regarding the pancreatic/peripancreatic lesion, I will forward her case to my partner Dr. Ardis Stevenson and request to review of the imaging study to see if endoscopic ultrasound would be indicated.  In addition, Kathryn Stevenson will return to the lab in about 2 weeks for hepatic function panel and a hepatitis B DNA viral load by PCR.  At 6 months from initial infection, we will recheck serologies to see if she has seroconverted to positive surface antibody, IgG core antibody and negative or low level DNA.   Total time 40 minutes, over half spent face-to-face with patient in counseling and coordination of care.   Kathryn Stevenson

## 2018-08-16 NOTE — Patient Instructions (Signed)
Your provider has requested that you go to the basement level for lab work around 08/30/2018. Press "B" on the elevator. The lab is located at the first door on the left as you exit the elevator.  It was a pleasure to see you today!  Dr. Loletha Carrow

## 2018-08-19 ENCOUNTER — Telehealth: Payer: Self-pay

## 2018-08-19 ENCOUNTER — Other Ambulatory Visit: Payer: Self-pay

## 2018-08-19 DIAGNOSIS — R933 Abnormal findings on diagnostic imaging of other parts of digestive tract: Secondary | ICD-10-CM

## 2018-08-19 DIAGNOSIS — K869 Disease of pancreas, unspecified: Secondary | ICD-10-CM

## 2018-08-19 NOTE — Telephone Encounter (Signed)
Left message on machine to call back NEED WRITTEN INSTRUCTIONS

## 2018-08-19 NOTE — Telephone Encounter (Signed)
-----   Message from Milus Banister, MD sent at 08/19/2018  5:47 AM EST ----- Mallie Mussel She needs upper EUS with FNA.  I'll alert PAt[facility] to work on scheduling.   Patty, She needs upper EUS; radial +/- linear, first available with MAC sedation, myself or Dr. Rush Landmark for pancreatic mass.  Thanks   ----- Message ----- From: Doran Stabler, MD Sent: 08/16/2018   4:45 PM EST To: Milus Banister, MD  Dan,  This patient with recent acute hepatitis B had an incidental pancreatic or peripancreatic lesion found on MRI.  I would appreciate if you would review and see what you think about an EUS.  Thanks  - HD

## 2018-08-19 NOTE — Telephone Encounter (Signed)
appt made for 3/16 at 1230 pm [facility] with Dr Rush Landmark

## 2018-08-20 NOTE — Telephone Encounter (Signed)
Line busy

## 2018-08-20 NOTE — Telephone Encounter (Signed)
Line continues to ring busy will mail and send via My Chart all information to the pt

## 2018-08-30 ENCOUNTER — Encounter (HOSPITAL_COMMUNITY): Payer: Self-pay

## 2018-08-30 NOTE — Progress Notes (Signed)
Gave patient pre-op instructions. Patient verbalized understanding and to arrive at 1100.   EKG: 07/13/18 CXR:07/15/18

## 2018-09-02 ENCOUNTER — Encounter (HOSPITAL_COMMUNITY): Payer: Self-pay | Admitting: Certified Registered Nurse Anesthetist

## 2018-09-02 ENCOUNTER — Ambulatory Visit (HOSPITAL_COMMUNITY): Admission: RE | Admit: 2018-09-02 | Payer: 59 | Source: Home / Self Care | Admitting: Gastroenterology

## 2018-09-02 ENCOUNTER — Telehealth: Payer: Self-pay | Admitting: Gastroenterology

## 2018-09-02 SURGERY — UPPER ENDOSCOPIC ULTRASOUND (EUS) RADIAL
Anesthesia: Monitor Anesthesia Care

## 2018-09-02 NOTE — Telephone Encounter (Signed)
Called and cancelled patient at hospital([facility]) for EUS. Pt states that she would like to wait and have procedure done at a later time due to corona virus. Patient states that she will call office back when she is really to schedule.

## 2018-09-02 NOTE — Telephone Encounter (Signed)
Pt wants to cancel appt she has at the hospital today at [facility] @ 12:30 due to the corona virus going around. PT would like to reschedule.

## 2018-09-04 ENCOUNTER — Ambulatory Visit: Payer: 59 | Admitting: Family Medicine

## 2018-09-04 ENCOUNTER — Telehealth: Payer: Self-pay | Admitting: Gastroenterology

## 2018-09-04 DIAGNOSIS — R935 Abnormal findings on diagnostic imaging of other abdominal regions, including retroperitoneum: Secondary | ICD-10-CM

## 2018-09-04 DIAGNOSIS — K769 Liver disease, unspecified: Secondary | ICD-10-CM

## 2018-09-04 DIAGNOSIS — R17 Unspecified jaundice: Secondary | ICD-10-CM

## 2018-09-04 DIAGNOSIS — B169 Acute hepatitis B without delta-agent and without hepatic coma: Secondary | ICD-10-CM

## 2018-09-04 NOTE — Telephone Encounter (Signed)
Kathryn Stevenson,  If you have not already done so, please place a recall for EUS 1 month from now so we do not lose track of this patient.  The pancreatic lesion needs to be investigated with EUS and she canceled procedure earlier this week due to risks of contracting infection.  I want to be sure we contact her to reschedule procedure if we do not hear from her sooner.

## 2018-09-04 NOTE — Telephone Encounter (Signed)
The recall has been added to Epic for 1 month EUS

## 2018-09-10 ENCOUNTER — Telehealth: Payer: Self-pay | Admitting: Gastroenterology

## 2018-09-10 NOTE — Telephone Encounter (Signed)
The pt was advised that we can not make any recommendations on Bio 3 pills.  She will call her PCP or pharmacy.  She had no questions regarding labs

## 2018-09-10 NOTE — Telephone Encounter (Signed)
Pt called in wanting to know if it is ok for her to take the Bio 3 Complete pills. She also has questions about lab work.

## 2018-09-23 ENCOUNTER — Ambulatory Visit (INDEPENDENT_AMBULATORY_CARE_PROVIDER_SITE_OTHER): Payer: 59 | Admitting: Family Medicine

## 2018-09-23 ENCOUNTER — Encounter: Payer: Self-pay | Admitting: Family Medicine

## 2018-09-23 ENCOUNTER — Other Ambulatory Visit: Payer: Self-pay

## 2018-09-23 DIAGNOSIS — L732 Hidradenitis suppurativa: Secondary | ICD-10-CM | POA: Diagnosis not present

## 2018-09-23 MED ORDER — CLINDAMYCIN HCL 300 MG PO CAPS: 300.0000 mg | ORAL_CAPSULE | Freq: Two times a day (BID) | ORAL | 0 refills | Status: AC

## 2018-09-23 NOTE — Progress Notes (Signed)
  [facility]  Virtual Visit via Telephone Note  I connected with Kathryn Stevenson on 09/23/18 at  1:00 PM EDT by telephone and verified that I am speaking with the correct person using two identifiers.   I discussed the limitations, risks, security and privacy concerns of performing an evaluation and management service by telephone and the availability of in person appointments. I also discussed with the patient that there may be a patient responsible charge related to this service. The patient expressed understanding and agreed to proceed.   History of Present Illness: Patient with a history of recurring boils. Patient reports having a boil under her left axilla x 3 days. Has used hot showers without relief. She states that she is inconsistent with taking minocycline for this. Denies fever, chills, or night sweats.     Observations/Objective: Patient with regular voice tone, rate and rhythm. Speaking calmly and is in no apparent distress.     Assessment and Plan: 1. Hidradenitis- since patient is on minocycline, will use clindamycin.  Continue with warm compresses to the area. Can also use topical otc triple antibiotic ointment BID.  - clindamycin (CLEOCIN) 300 MG capsule; Take 1 capsule (300 mg total) by mouth 2 (two) times daily for 10 days.  Dispense: 20 capsule; Refill: 0   Follow Up Instructions:    I discussed the assessment and treatment plan with the patient. The patient was provided an opportunity to ask questions and all were answered. The patient agreed with the plan and demonstrated an understanding of the instructions.   The patient was advised to call back or seek an in-person evaluation if the symptoms worsen or if the condition fails to improve as anticipated.  I provided 5  minutes of non-face-to-face time during this encounter.  Ms. Kathryn L. Nathaneil Canary, FNP-BC [facility] [address]  [place], [place] [address] [phone]

## 2018-10-15 ENCOUNTER — Telehealth: Payer: Self-pay

## 2018-10-15 DIAGNOSIS — L732 Hidradenitis suppurativa: Secondary | ICD-10-CM

## 2018-10-16 MED ORDER — DOXYCYCLINE HYCLATE 100 MG PO TABS: 100.0000 mg | ORAL_TABLET | Freq: Two times a day (BID) | ORAL | 0 refills | Status: DC

## 2018-10-16 NOTE — Telephone Encounter (Signed)
Patient called and states that the boil under her arm is trying to come back and is asking if she can have an rx for the doxycycline? She had a tele visit on 09/23/2018. If so, can you please send to [facility]. Thanks!

## 2018-11-08 ENCOUNTER — Other Ambulatory Visit: Payer: Self-pay | Admitting: Family Medicine

## 2018-11-08 DIAGNOSIS — L732 Hidradenitis suppurativa: Secondary | ICD-10-CM

## 2018-11-08 NOTE — Telephone Encounter (Signed)
Kathryn Stevenson, can this be refilled for hidradenitiis?

## 2018-11-25 NOTE — Telephone Encounter (Signed)
Patient notified that we are working to get her set up for repeat EUS and she will be contacted by Dr. Donneta Romberg nurse soon with an appt date and time.  She wanted Dr. Loletha Carrow to know "I  feel great and back to my normal self".  She does ask if she can have her LFT's checked prior "just for my curiosity"??

## 2018-11-25 NOTE — Telephone Encounter (Signed)
Patient calling to know if she does not need to be checked again she has not been reached since COVID stared. She said she feels fine but would like to speak top someone.

## 2018-11-26 NOTE — Telephone Encounter (Signed)
I am very glad she called to check in with [place].  Yes, she needs some repeat labs so we can confirm that she eventually seroconverts the hepatitis B infection.  Please send her for the following labs:  Hepatic function panel Hepatitis B surface antigen Hepatitis B surface antibody  Hepatitis B core antibody total and IgG Hepatitis B viral load DNA by PCR

## 2018-11-26 NOTE — Addendum Note (Signed)
Addended by: Marlon Pel on: 11/26/2018 09:57 AM   Modules accepted: Orders

## 2018-11-26 NOTE — Telephone Encounter (Signed)
Patient notified of the recommendations.  She will come for labs at her convenience

## 2018-11-29 ENCOUNTER — Telehealth: Payer: Self-pay

## 2018-11-29 NOTE — Telephone Encounter (Signed)
Called to do COVID Screening for appointment tomorrow. No answer. Left a message to call back. Thanks! 

## 2018-12-02 ENCOUNTER — Ambulatory Visit: Payer: 59 | Admitting: Family Medicine

## 2018-12-05 ENCOUNTER — Telehealth: Payer: Self-pay

## 2018-12-05 NOTE — Telephone Encounter (Signed)
Called and spoke with patient for COVID 19 Screening. Patient had no risk factors and is cleared to come into office for appointment. Thanks! 

## 2018-12-06 ENCOUNTER — Ambulatory Visit: Payer: 59 | Admitting: Family Medicine

## 2018-12-10 ENCOUNTER — Other Ambulatory Visit: Payer: Self-pay

## 2018-12-10 ENCOUNTER — Encounter (HOSPITAL_COMMUNITY): Payer: Self-pay

## 2018-12-10 ENCOUNTER — Emergency Department (HOSPITAL_COMMUNITY)
Admission: EM | Admit: 2018-12-10 | Discharge: 2018-12-11 | Disposition: A | Discharge status: Home / Self Care | Payer: 59 | Attending: Emergency Medicine | Admitting: Emergency Medicine

## 2018-12-10 DIAGNOSIS — N920 Excessive and frequent menstruation with regular cycle: Secondary | ICD-10-CM | POA: Diagnosis not present

## 2018-12-10 DIAGNOSIS — Z87891 Personal history of nicotine dependence: Secondary | ICD-10-CM | POA: Diagnosis not present

## 2018-12-10 DIAGNOSIS — L02412 Cutaneous abscess of left axilla: Secondary | ICD-10-CM | POA: Diagnosis not present

## 2018-12-10 DIAGNOSIS — R2232 Localized swelling, mass and lump, left upper limb: Secondary | ICD-10-CM | POA: Diagnosis present

## 2018-12-10 LAB — I-STAT BETA HCG BLOOD, ED (MC, WL, AP ONLY): I-stat hCG, quantitative: <5 m[IU]/mL (ref <5)

## 2018-12-10 MED ORDER — LIDOCAINE-EPINEPHRINE 1 %-1:100000 IJ SOLN
10.0000 mL | Freq: Once | INTRAMUSCULAR | Status: AC
  Administered 2018-12-11: 10 mL
  Filled 2018-12-10: qty 10

## 2018-12-10 NOTE — ED Triage Notes (Signed)
Pt states she has a boil under left arm.  Pt also c/o menstrual bleeding x 1 week. Pt states that is abnormal and it is heavier than normal.

## 2018-12-10 NOTE — ED Provider Notes (Signed)
[facility] DEPT Provider Note   CSN: [id number] Arrival date & time: 12/10/18  1828    History   Chief Complaint Chief Complaint  Patient presents with  . Vaginal Bleeding  . Abscess    HPI Kathryn Stevenson is a 43 y.o. female.     43 year old female with a history of esophageal reflux, hepatitis B, morbid obesity presents to the ED for evaluation of abscess to her left axilla x3 days.  She has had recurrent abscess in this location.  Has been using hot showers to try and promote spontaneous drainage without relief.  Reports some constant, mild discomfort worse with arm movement.  No fevers.  As an aside, patient noting 1 week of vaginal bleeding. Began as her regular menses, but has lasted longer. Requires use of 5-7 super absorbency pads per day. Feels fatigued and is concerned she is losing too much blood. No hx of trauma or associated abdominal pain, syncope, lightheadedness. Not on chronic anticoagulation, per chart hx.  The history is provided by the patient. No language interpreter was used.  Vaginal Bleeding Abscess   Past Medical History:  Diagnosis Date  . Acid reflux   . Dermatitis   . Fracture, ankle   . GERD (gastroesophageal reflux disease)   . Headache   . Hepatic hemangioma   . Hepatitis B   . Morbid obesity ([place])   . Pancreatic lesion 06/2018   seen on MR of the liver     Patient Active Problem List   Diagnosis Date Noted  . Acute hepatitis B   . Elevated LFTs   . Malaise and fatigue   . Liver dysfunction 07/13/2018  . Pancreatic lesion 07/13/2018  . Right leg pain 03/29/2017  . RUQ abdominal pain 06/30/2016  . Liver lesion 06/30/2016  . Gastroesophageal reflux disease 06/30/2016  . Morbid obesity ([place]) 09/20/2015  . Severe obesity (BMI >= 40) ([place]) 09/20/2015  . Dermatitis 09/20/2015    Past Surgical History:  Procedure Laterality Date  . ANKLE SURGERY Left   . INDUCED ABORTION    . MOUTH SURGERY    .  TUBAL LIGATION       OB History    Gravida  6   Para  2   Term  2   Preterm      AB  4   Living  2     SAB  1   TAB  3   Ectopic      Multiple      Live Births               Home Medications    Prior to Admission medications   Medication Sig Start Date End Date Taking? Authorizing Provider  ibuprofen (ADVIL,MOTRIN) 200 MG tablet Take 600 mg by mouth every 8 (eight) hours as needed for moderate pain.    Yes [provider]  Multiple Vitamins-Minerals (CENTRUM WOMEN) TABS Take 1 tablet by mouth daily.   Yes [provider]  pantoprazole (PROTONIX) 40 MG tablet Take 40 mg by mouth daily as needed (heartburn).    Yes [provider]  sulfamethoxazole-trimethoprim (BACTRIM DS) 800-160 MG tablet Take 1 tablet by mouth 2 (two) times daily for 7 days. 12/11/18 12/18/18  Antonietta Breach, PA-C    Family History Family History  Problem Relation Age of Onset  . Hypertension Mother   . Ovarian cancer Maternal Grandmother   . Heart attack Maternal Grandmother   . Esophageal cancer Neg Hx   .  Colon cancer Neg Hx   . Pancreatic cancer Neg Hx   . Stomach cancer Neg Hx   . Liver disease Neg Hx     Social History Social History   Tobacco Use  . Smoking status: Former Smoker    Packs/day: 0.50    Types: Cigarettes    Quit date: 12/11/2016    Years since quitting: 2.0  . Smokeless tobacco: Never Used  Substance Use Topics  . Alcohol use: No  . Drug use: No     Allergies   Patient has no known allergies.   Review of Systems Review of Systems  Genitourinary: Positive for vaginal bleeding.  Ten systems reviewed and are negative for acute change, except as noted in the HPI.    Physical Exam Updated Vital Signs BP (!) 153/83   Pulse (!) 56   Temp 99.4 F (37.4 C) (Oral)   Resp 17   Wt (!) 160.1 kg   LMP 12/03/2018   SpO2 100%   BMI 56.98 kg/m   Physical Exam Vitals signs and nursing note reviewed.  Constitutional:       General: She is not in acute distress.    Appearance: She is well-developed. She is not diaphoretic.     Comments: Pleasant, obese female, in NAD  HENT:     Head: Normocephalic and atraumatic.  Eyes:     General: No scleral icterus.    Conjunctiva/sclera: Conjunctivae normal.  Neck:     Musculoskeletal: Normal range of motion.  Cardiovascular:     Rate and Rhythm: Normal rate and regular rhythm.     Pulses: Normal pulses.  Pulmonary:     Effort: Pulmonary effort is normal. No respiratory distress.     Comments: Respirations even and unlabored Musculoskeletal: Normal range of motion.  Skin:    General: Skin is warm and dry.     Coloration: Skin is not pale.     Findings: No erythema or rash.     Comments: Induration to the left axilla without overlying erythema, heat to touch, drainage. Minimal fluctuance.  Neurological:     General: No focal deficit present.     Mental Status: She is alert and oriented to person, place, and time.     Coordination: Coordination normal.     Comments: GCS 15. Moving all extremities spontaneously.  Psychiatric:        Behavior: Behavior normal.      ED Treatments / Results  Labs (all labs ordered are listed, but only abnormal results are displayed) Labs Reviewed  CBC - Abnormal; Notable for the following components:      Result Value   WBC 11.3 (*)    RDW 17.7 (*)    All other components within normal limits  I-STAT BETA HCG BLOOD, ED ([facility], [facility], [facility] ONLY)    EKG None  Radiology No results found.  Procedures Procedures (including critical care time)  INCISION AND DRAINAGE Performed by: Antonietta Breach Consent: Verbal consent obtained. Risks and benefits: risks, benefits and alternatives were discussed Type: abscess  Body area: L axilla  Anesthesia: local infiltration  Incision was made with a scalpel.  Local anesthetic: lidocaine 1% with epinephrine  Anesthetic total: 8 ml  Complexity: complex Blunt dissection to break up  loculations  Drainage: purulent  Drainage amount: copious  Packing material: none  Patient tolerance: Patient tolerated the procedure well with no immediate complications.   Medications Ordered in ED Medications  lidocaine-EPINEPHrine (XYLOCAINE W/EPI) 1 %-1:100000 (with pres) injection 10  mL (10 mLs Other Given by Other 12/11/18 0041)     Initial Impression / Assessment and Plan / ED Course  I have reviewed the triage vital signs and the nursing notes.  Pertinent labs & imaging results that were available during my care of the patient were reviewed by me and considered in my medical decision making (see chart for details).        Patient with skin abscess amenable to incision and drainage.  Abscess was not large enough to warrant packing or drain; wound recheck in 2 days. Encouraged home warm soaks and flushing.  Mild signs of cellulitis is surrounding skin.  Will d/c to home on Bactrim x 1 week.  Patient also expressing concern about menorrhagia.  She is hemodynamically stable in the emergency department.  Hemoglobin is reassuring.  She has no associated fevers or abdominal pain.  Encouraged PCP and/or OBGYN follow up.  Return precautions discussed and provided. Patient discharged in stable condition with no unaddressed concerns.   Final Clinical Impressions(s) / ED Diagnoses   Final diagnoses:  Abscess of left axilla  Menorrhagia with regular cycle    ED Discharge Orders         Ordered    sulfamethoxazole-trimethoprim (BACTRIM DS) 800-160 MG tablet  2 times daily     12/11/18 0036           Antonietta Breach, PA-C 12/11/18 0102    Orpah Greek, MD 12/11/18 [phone]

## 2018-12-11 ENCOUNTER — Ambulatory Visit: Payer: 59 | Admitting: Family Medicine

## 2018-12-11 LAB — CBC
HCT: 36.5 % (ref 36.0–46.0)
Hemoglobin: 12.6 g/dL (ref 12.0–15.0)
MCH: 28.9 pg (ref 26.0–34.0)
MCHC: 34.5 g/dL (ref 30.0–36.0)
MCV: 83.7 fL (ref 80.0–100.0)
Platelets: 264 10*3/uL (ref 150–400)
RBC: 4.36 MIL/uL (ref 3.87–5.11)
RDW: 17.7 % — ABNORMAL HIGH (ref 11.5–15.5)
WBC: 11.3 10*3/uL — ABNORMAL HIGH (ref 4.0–10.5)
nRBC: 0 % (ref 0.0–0.2)

## 2018-12-11 MED ORDER — SULFAMETHOXAZOLE-TRIMETHOPRIM 800-160 MG PO TABS: 1.0000 | ORAL_TABLET | Freq: Two times a day (BID) | ORAL | 0 refills | Status: AC

## 2018-12-11 NOTE — ED Notes (Signed)
Pt verbalized discharge instructions and follow up care. Alert and ambulatory. No IV.  

## 2018-12-11 NOTE — ED Notes (Signed)
I&D materials at bedside for PA

## 2018-12-11 NOTE — Discharge Instructions (Signed)
Take Bactrim as prescribed until finished.  Use Tylenol or ibuprofen for management of pain.  Continue with frequent warm soaks or compresses to promote drainage.  Change your dressing at least 1-2 times per day to keep the area clean and dry.  You may return for new or concerning symptoms.

## 2018-12-16 ENCOUNTER — Encounter: Payer: Self-pay | Admitting: Family Medicine

## 2018-12-16 ENCOUNTER — Other Ambulatory Visit: Payer: Self-pay

## 2018-12-16 ENCOUNTER — Other Ambulatory Visit: Payer: 59

## 2018-12-16 ENCOUNTER — Ambulatory Visit (INDEPENDENT_AMBULATORY_CARE_PROVIDER_SITE_OTHER): Payer: 59 | Admitting: Family Medicine

## 2018-12-16 VITALS — BP 120/66 | HR 67 | Temp 98.6°F | Resp 16 | Ht 66.0 in | Wt 345.0 lb

## 2018-12-16 DIAGNOSIS — N921 Excessive and frequent menstruation with irregular cycle: Secondary | ICD-10-CM

## 2018-12-16 DIAGNOSIS — B169 Acute hepatitis B without delta-agent and without hepatic coma: Secondary | ICD-10-CM

## 2018-12-16 DIAGNOSIS — K769 Liver disease, unspecified: Secondary | ICD-10-CM

## 2018-12-16 DIAGNOSIS — K7689 Other specified diseases of liver: Secondary | ICD-10-CM | POA: Diagnosis not present

## 2018-12-16 DIAGNOSIS — R935 Abnormal findings on diagnostic imaging of other abdominal regions, including retroperitoneum: Secondary | ICD-10-CM

## 2018-12-16 DIAGNOSIS — R17 Unspecified jaundice: Secondary | ICD-10-CM

## 2018-12-16 MED ORDER — NAPROXEN SODIUM 220 MG PO TABS: 440.0000 mg | ORAL_TABLET | Freq: Two times a day (BID) | ORAL | Status: DC

## 2018-12-16 NOTE — Progress Notes (Signed)
  [facility]   Progress Note: General Provider: [name], FNP  SUBJECTIVE:   Kathryn Stevenson is a 43 y.o. female who  has a past medical history of Acid reflux, Dermatitis, Fracture, ankle, GERD (gastroesophageal reflux disease), Headache, Hepatic hemangioma, Hepatitis B, Morbid obesity ([place]), and Pancreatic lesion (06/2018).. Patient presents today for Vaginal Bleeding (heavy period since 12/04/2018 ) and Follow-up (wants labs done for liver function )  Patient is followed by GI for elevated liver function. Found to have hepatitis B with hepatic hemangioma and pancreatic lesion. She was last seen by Dr. Loletha Stevenson in February 2020. She is to follow up with labs with his office.  She was seen in the ED on 12/10/2018 for an abscess in the axilla and menorrahgia with regular cycle. She reports her period starting on 12/04/2018 and lasting until today. She has to use 4-5 super tampons per day.  Beta HCG negative H&H normal  on 12/10/2018.  Family history of cervical and female cancers.  She states that she has been trying to lose weight and recently changed her diet.  Review of Systems  Constitutional: Negative.   HENT: Negative.   Eyes: Negative.   Respiratory: Negative.   Cardiovascular: Negative.   Gastrointestinal: Negative.   Genitourinary: Negative.        Abnormal heavy menstrual bleeding  Musculoskeletal: Negative.   Skin: Negative.   Neurological: Negative.   Psychiatric/Behavioral: Negative.      OBJECTIVE: BP 120/66 (BP Location: Left Arm, Patient Position: Sitting, Cuff Size: Large)   Pulse 67   Temp 98.6 F (37 C) (Oral)   Resp 16   Ht 5\' 6"  (1.676 m)   Wt (!) 345 lb (156.5 kg)   LMP 12/03/2018   SpO2 100%   BMI 55.68 kg/m   Wt Readings from Last 3 Encounters:  12/16/18 (!) 345 lb (156.5 kg)  12/10/18 (!) 353 lb (160.1 kg)  08/16/18 (!) 321 lb 8 oz (145.8 kg)     Physical Exam  ASSESSMENT/PLAN:   1. Menorrhagia  with irregular cycle Patient declined STI testing today. Will refer to Gyn for further evaluation due to family history of female cancers.  Advised patient to take take 2 otc aleve tablets 2 times a day for 7 days.  - Ambulatory referral to Gynecology- appt made today and given to patient  2. Liver dysfunction Patient to follow up with GI for lab evaluation.    Return in about 6 months (around 06/17/2019), or if symptoms worsen or fail to improve.    The patient was given clear instructions to go to ER or return to medical center if symptoms do not improve, worsen or new problems develop. The patient verbalized understanding and agreed with plan of care.   Ms. Kathryn Sou. Nathaneil Canary, FNP-BC [facility] [address] [place], [place] [address] [phone]

## 2018-12-16 NOTE — Patient Instructions (Addendum)
To help with vaginal bleeding, take 2 aleve twice a day for 7 days.   Menorrhagia Menorrhagia is when your menstrual periods are heavy or last longer than normal. Follow these instructions at home: Medicines   Take over-the-counter and prescription medicines exactly as told by your doctor. This includes iron pills.  Do not change or switch medicines without asking your doctor.  Do not take aspirin or medicines that contain aspirin 1 week before or during your period. Aspirin may make bleeding worse. General instructions  If you need to change your pad or tampon more than once every 2 hours, limit your activity until the bleeding stops.  Iron pills can cause problems when pooping (constipation). To prevent or treat pooping problems while taking prescription iron pills, your doctor may suggest that you: ? Drink enough fluid to keep your pee (urine) clear or pale yellow. ? Take over-the-counter or prescription medicines. ? Eat foods that are high in fiber. These foods include: ? Fresh fruits and vegetables. ? Whole grains. ? Beans. ? Limit foods that are high in fat and processed sugars. This includes fried and sweet foods.  Eat healthy meals and foods that are high in iron. Foods that have a lot of iron include: ? Leafy green vegetables. ? Meat. ? Liver. ? Eggs. ? Whole grain breads and cereals.  Do not try to lose weight until your heavy bleeding has stopped and you have normal amounts of iron in your blood. If you need to lose weight, work with your doctor.  Keep all follow-up visits as told by your doctor. This is important. Contact a doctor if:  You soak through a pad or tampon every 1 or 2 hours, and this happens every time you have a period.  You need to use pads and tampons at the same time because you are bleeding so much.  You are taking medicine and you: ? Feel sick to your stomach (nauseous). ? Throw up (vomit). ? Have watery poop (diarrhea).  You have other  problems that may be related to the medicine you are taking. Get help right away if:  You soak through more than a pad or tampon in 1 hour.  You pass clots bigger than 1 inch (2.5 cm) wide.  You feel short of breath.  You feel like your heart is beating too fast.  You feel dizzy or you pass out (faint).  You feel very weak or tired. Summary  Menorrhagia is when your menstrual periods are heavy or last longer than normal.  Take over-the-counter and prescription medicines exactly as told by your doctor. This includes iron pills.  Contact a doctor if you soak through more than a pad or tampon in 1 hour or are passing large clots. This information is not intended to replace advice given to you by your health care provider. Make sure you discuss any questions you have with your health care provider. Document Released: 03/14/2008 Document Revised: 09/12/2017 Document Reviewed: 06/26/2016 Elsevier Patient Education  2020 [employer].

## 2018-12-24 LAB — HEPATITIS B CORE ANTIBODY, IGM: Hep B C IgM: REACTIVE — AB

## 2018-12-24 LAB — HEPATITIS B DNA, ULTRAQUANTITATIVE, PCR
Hepatitis B DNA (Calc): <1 Log IU/mL
Hepatitis B DNA: <10 IU/mL

## 2018-12-24 LAB — HEPATITIS B SURFACE ANTIGEN: Hepatitis B Surface Ag: NONREACTIVE

## 2018-12-24 LAB — HEPATITIS B SURFACE ANTIBODY, QUANTITATIVE: Hep B S AB Quant (Post): 67 m[IU]/mL (ref >=10)

## 2018-12-24 LAB — HEPATITIS B CORE ANTIBODY, TOTAL: Hep B Core Total Ab: REACTIVE — AB

## 2018-12-25 ENCOUNTER — Other Ambulatory Visit: Payer: Self-pay

## 2018-12-25 ENCOUNTER — Telehealth: Payer: Self-pay

## 2018-12-25 DIAGNOSIS — K869 Disease of pancreas, unspecified: Secondary | ICD-10-CM

## 2018-12-25 NOTE — Telephone Encounter (Signed)
-----   Message from Irving Copas., MD sent at 12/24/2018  5:24 PM EDT ----- Regarding: Follow up Kathryn Stevenson and Kathryn Stevenson,Kathryn Stevenson will have a clearer sense of my schedule for the coming weeks.I would like to get her set up for an urgent EUS in the next 3 weeks.If necessary I am happy to do her procedure during the hospital week at the end of the month (preference Tuesday/Wednesday/Thursday).As it is been over 6 months since her last imaging study it will be helpful to have updated cross-sectional imaging to see if anything has changed prior to EUS.My preference would be a CT abdomen pancreas protocol with IV contrast.If you to can work on trying to find a date and get that CT scan done before that would be great.Please update Dr. Loletha Carrow and myself once we have a date. Thank you. GM ----- Message ----- From: Algernon Huxley, RN Sent: 12/24/2018   2:38 PM EDT To: Timothy Lasso, RN, Irving Copas., MD  Spoke with pt and she is aware of lab results. She knows she will be receiving a call in the future to have EUS scheduled.

## 2018-12-25 NOTE — Telephone Encounter (Signed)
You have been scheduled for a CT scan of the abdomen w/& w/o contrast(pancreas protocol with IV contrast) at [place] are scheduled on 12/30/2018 at [place] should arrive 15 minutes prior to your appointment time for registration. Please follow the written instructions below on the day of your exam:  WARNING: IF YOU ARE ALLERGIC TO IODINE/X-RAY DYE, PLEASE NOTIFY RADIOLOGY IMMEDIATELY AT [phone]! YOU WILL BE GIVEN A 13 HOUR PREMEDICATION PREP.  1) Do not eat or drink anything after 4:00am (4 hours prior to your test)  You may take any medications as prescribed with a small amount of water, if necessary. If you take any of the following medications: METFORMIN, GLUCOPHAGE, GLUCOVANCE, AVANDAMET, RIOMET, FORTAMET, [place] MET, JANUMET, GLUMETZA or METAGLIP, you MAY be asked to HOLD this medication 48 hours AFTER the exam.  The purpose of you drinking the oral contrast is to aid in the visualization of your intestinal tract. The contrast solution may cause some diarrhea. Depending on your individual set of symptoms, you may also receive an intravenous injection of x-ray contrast/dye. Plan on being at [facility] for 30 minutes or longer, depending on the type of exam you are having performed.  This test typically takes 30-45 minutes to complete.  If you have any questions regarding your exam or if you need to reschedule, you may call the CT department at [phone] between the hours of 8:00 am and 5:00 pm, [place].  EUS-  scheduled for 01/15/2019 @ [facility] 7:30am , instructions mailed to pt.

## 2018-12-30 ENCOUNTER — Encounter (HOSPITAL_COMMUNITY): Payer: Self-pay | Admitting: Radiology

## 2018-12-30 ENCOUNTER — Other Ambulatory Visit: Payer: Self-pay

## 2018-12-30 ENCOUNTER — Ambulatory Visit (HOSPITAL_COMMUNITY)
Admission: RE | Admit: 2018-12-30 | Discharge: 2018-12-30 | Disposition: A | Discharge status: Home / Self Care | Payer: 59 | Source: Ambulatory Visit | Attending: Gastroenterology | Admitting: Gastroenterology

## 2018-12-30 DIAGNOSIS — K869 Disease of pancreas, unspecified: Secondary | ICD-10-CM | POA: Diagnosis present

## 2018-12-30 MED ORDER — SODIUM CHLORIDE (PF) 0.9 % IJ SOLN
INTRAMUSCULAR | Status: AC
  Filled 2018-12-30: qty 50

## 2018-12-30 MED ORDER — IOHEXOL 300 MG/ML  SOLN
100.0000 mL | Freq: Once | INTRAMUSCULAR | Status: AC | PRN
  Administered 2018-12-30: 100 mL via INTRAVENOUS

## 2018-12-31 ENCOUNTER — Other Ambulatory Visit (HOSPITAL_COMMUNITY)
Admission: RE | Admit: 2018-12-31 | Discharge: 2018-12-31 | Disposition: A | Discharge status: Home / Self Care | Payer: 59 | Source: Ambulatory Visit | Attending: Obstetrics & Gynecology | Admitting: Obstetrics & Gynecology

## 2018-12-31 ENCOUNTER — Encounter: Payer: Self-pay | Admitting: Obstetrics & Gynecology

## 2018-12-31 ENCOUNTER — Ambulatory Visit (INDEPENDENT_AMBULATORY_CARE_PROVIDER_SITE_OTHER): Payer: 59 | Admitting: Obstetrics & Gynecology

## 2018-12-31 VITALS — BP 135/82 | HR 68 | Wt 349.0 lb

## 2018-12-31 DIAGNOSIS — Z113 Encounter for screening for infections with a predominantly sexual mode of transmission: Secondary | ICD-10-CM | POA: Diagnosis not present

## 2018-12-31 DIAGNOSIS — N938 Other specified abnormal uterine and vaginal bleeding: Secondary | ICD-10-CM

## 2018-12-31 MED ORDER — IBUPROFEN 600 MG PO TABS: 600.0000 mg | ORAL_TABLET | Freq: Four times a day (QID) | ORAL | 1 refills | Status: DC | PRN

## 2018-12-31 NOTE — Patient Instructions (Signed)

## 2018-12-31 NOTE — Progress Notes (Signed)
Patient ID: Kathryn Stevenson, female   DOB: 1975/09/09, 43 y.o.   MRN: [id number]  Chief Complaint  Patient presents with  . Menorrhagia    HPI [name] is a 43 y.o. female.  [id number] Patient's last menstrual period was 12/04/2018 (approximate). She had regular menses with 3-4 days of bleeding until LMP which was heavier and more painful and lasted 2.5 weeks. She usually cramps and most heavy flow for first day only   Past Medical History:  Diagnosis Date  . Acid reflux   . Dermatitis   . Fracture, ankle   . GERD (gastroesophageal reflux disease)   . Headache   . Hepatic hemangioma   . Hepatitis B   . Morbid obesity ([place])   . Pancreatic lesion 06/2018   seen on MR of the liver     Past Surgical History:  Procedure Laterality Date  . ANKLE SURGERY Left   . INDUCED ABORTION    . MOUTH SURGERY    . TUBAL LIGATION      Family History  Problem Relation Age of Onset  . Hypertension Mother   . Ovarian cancer Maternal Grandmother   . Heart attack Maternal Grandmother   . Esophageal cancer Neg Hx   . Colon cancer Neg Hx   . Pancreatic cancer Neg Hx   . Stomach cancer Neg Hx   . Liver disease Neg Hx     Social History Social History   Tobacco Use  . Smoking status: Former Smoker    Packs/day: 0.50    Types: Cigarettes    Quit date: 12/11/2016    Years since quitting: 2.0  . Smokeless tobacco: Never Used  Substance Use Topics  . Alcohol use: No  . Drug use: No    No Known Allergies  Current Outpatient Medications  Medication Sig Dispense Refill  . Multiple Vitamins-Minerals (CENTRUM WOMEN) TABS Take 1 tablet by mouth daily.    . naproxen sodium (ALEVE) 220 MG tablet Take 2 tablets (440 mg total) by mouth 2 (two) times daily with a meal.    . pantoprazole (PROTONIX) 40 MG tablet Take 40 mg by mouth daily as needed (heartburn).      No current facility-administered medications for this visit.     Review of Systems Review of Systems  Constitutional:  Negative.   Gastrointestinal: Negative.   Genitourinary: Positive for frequency, menstrual problem and pelvic pain. Negative for vaginal bleeding, vaginal discharge and vaginal pain.    Blood pressure 135/82, pulse 68, weight (!) 349 lb (158.3 kg), last menstrual period 12/04/2018.  Physical Exam Physical Exam Constitutional:      Appearance: She is obese. She is not ill-appearing.  Cardiovascular:     Rate and Rhythm: Normal rate.  Pulmonary:     Effort: Pulmonary effort is normal.  Abdominal:     Comments: Obese, not tender  Genitourinary:    General: Normal vulva.     Vagina: No vaginal discharge.     Comments: Pelvic exam: normal external genitalia, vulva, vagina, cervix, uterus and adnexa.  Skin:    General: Skin is warm and dry.  Neurological:     General: No focal deficit present.     Mental Status: She is alert.  Psychiatric:        Mood and Affect: Mood normal.    Breasts: breasts appear normal, no suspicious masses, no skin or nipple changes or axillary nodes.  Data Reviewed Pap 2018 nl [place] 2016 pelvis nl Assessment DUB, probable perimenopausal  Requests STD screen Recent eval for hepatitis  Plan Orders Placed This Encounter  Procedures  . RPR  . HIV Antibody (routine testing w rflx)  . [place]  . Estrogens, Total  . TestT+TestF+SHBG   RTC 3 months and record menses Ibuprofen for menstrual cramps    Emeterio Reeve 12/31/2018, 9:52 AM

## 2018-12-31 NOTE — Progress Notes (Signed)
NGYN patient for Abnormal Vaginal Bleeding  LMP: 12/04/18   pt states she had bleeding ongoing after period that did not stop. Bleeding was heavy cycles usually last 3-4 days Pt states she was changing pads every hr.  Pt also notes pelvic pain onset x 1 week.  No recent U/S Pt denies any pain during or after intercourse Denies any dysuria, no vaginal odor or discharge   Contraception:BTL Last pap:12/08/2016 WNL

## 2019-01-01 LAB — CERVICOVAGINAL ANCILLARY ONLY
Bacterial vaginitis: POSITIVE — AB
Candida vaginitis: NEGATIVE
Chlamydia: NEGATIVE
Neisseria Gonorrhea: NEGATIVE
Trichomonas: NEGATIVE

## 2019-01-03 ENCOUNTER — Other Ambulatory Visit: Payer: Self-pay | Admitting: Obstetrics & Gynecology

## 2019-01-03 DIAGNOSIS — N760 Acute vaginitis: Secondary | ICD-10-CM

## 2019-01-03 DIAGNOSIS — B9689 Other specified bacterial agents as the cause of diseases classified elsewhere: Secondary | ICD-10-CM

## 2019-01-03 LAB — RPR: RPR Ser Ql: NONREACTIVE

## 2019-01-03 LAB — TESTT+TESTF+SHBG
Sex Hormone Binding: 48.2 nmol/L (ref 24.6–122.0)
Testosterone, Free: 2.5 pg/mL (ref 0.0–4.2)
Testosterone, Total, LC/MS: 37 ng/dL

## 2019-01-03 LAB — FOLLICLE STIMULATING HORMONE: FSH: 7.4 m[IU]/mL

## 2019-01-03 LAB — ESTROGENS, TOTAL: Estrogen: 195 pg/mL

## 2019-01-03 LAB — HIV ANTIBODY (ROUTINE TESTING W REFLEX): HIV Screen 4th Generation wRfx: NONREACTIVE

## 2019-01-03 MED ORDER — METRONIDAZOLE 500 MG PO TABS: 500.0000 mg | ORAL_TABLET | Freq: Two times a day (BID) | ORAL | 0 refills | Status: DC

## 2019-01-03 NOTE — Progress Notes (Signed)
Flagyl for BV

## 2019-01-11 ENCOUNTER — Other Ambulatory Visit (HOSPITAL_COMMUNITY)
Admission: RE | Admit: 2019-01-11 | Discharge: 2019-01-11 | Disposition: A | Discharge status: Home / Self Care | Payer: 59 | Source: Ambulatory Visit | Attending: Gastroenterology | Admitting: Gastroenterology

## 2019-01-11 DIAGNOSIS — Z1159 Encounter for screening for other viral diseases: Secondary | ICD-10-CM | POA: Diagnosis not present

## 2019-01-11 LAB — SARS CORONAVIRUS 2 (TAT 6-24 HRS): SARS Coronavirus 2: NEGATIVE

## 2019-01-14 ENCOUNTER — Encounter (HOSPITAL_COMMUNITY): Payer: Self-pay | Admitting: *Deleted

## 2019-01-14 NOTE — Anesthesia Preprocedure Evaluation (Addendum)
Anesthesia Evaluation  Patient identified by MRN, date of birth, ID band Patient awake    Reviewed: Allergy & Precautions, H&P , NPO status , Patient's Chart, lab work & pertinent test results  Airway Mallampati: I  TM Distance: >3 FB Neck ROM: Full    Dental no notable dental hx. (+) Teeth Intact   Pulmonary neg pulmonary ROS, former smoker,    Pulmonary exam normal breath sounds clear to auscultation       Cardiovascular Exercise Tolerance: Good negative cardio ROS Normal cardiovascular exam Rhythm:Regular Rate:Normal     Neuro/Psych negative neurological ROS  negative psych ROS   GI/Hepatic negative GI ROS, Neg liver ROS, GERD  Medicated,(+) Hepatitis -, BHepatic hemangioma   Pancreatic lesion 06/2018 seen on MR of the liver     Endo/Other  negative endocrine ROSMorbid obesity  Renal/GU negative Renal ROS  negative genitourinary   Musculoskeletal negative musculoskeletal ROS (+)   Abdominal (+) + obese,   Peds negative pediatric ROS (+)  Hematology negative hematology ROS (+)   Anesthesia Other Findings   Reproductive/Obstetrics negative OB ROS                           Anesthesia Physical Anesthesia Plan  ASA: III  Anesthesia Plan: MAC   Post-op Pain Management:    Induction: Intravenous  PONV Risk Score and Plan: 2 and Treatment may vary due to age or medical condition and Propofol infusion  Airway Management Planned: Mask and Natural Airway  Additional Equipment:   Intra-op Plan:   Post-operative Plan:   Informed Consent: I have reviewed the patients History and Physical, chart, labs and discussed the procedure including the risks, benefits and alternatives for the proposed anesthesia with the patient or authorized representative who has indicated his/her understanding and acceptance.       Plan Discussed with: Anesthesiologist, CRNA and Surgeon  Anesthesia Plan  Comments:        Anesthesia Quick Evaluation

## 2019-01-15 ENCOUNTER — Ambulatory Visit (HOSPITAL_COMMUNITY): Payer: 59 | Admitting: Anesthesiology

## 2019-01-15 ENCOUNTER — Encounter (HOSPITAL_COMMUNITY): Payer: Self-pay | Admitting: Anesthesiology

## 2019-01-15 ENCOUNTER — Other Ambulatory Visit: Payer: Self-pay

## 2019-01-15 ENCOUNTER — Ambulatory Visit (HOSPITAL_COMMUNITY)
Admission: RE | Admit: 2019-01-15 | Discharge: 2019-01-15 | Disposition: A | Discharge status: Home / Self Care | Payer: 59 | Attending: Gastroenterology | Admitting: Gastroenterology

## 2019-01-15 ENCOUNTER — Encounter (HOSPITAL_COMMUNITY)
Admission: RE | Disposition: A | Discharge status: Home / Self Care | Payer: Self-pay | Source: Home / Self Care | Attending: Gastroenterology

## 2019-01-15 DIAGNOSIS — K259 Gastric ulcer, unspecified as acute or chronic, without hemorrhage or perforation: Secondary | ICD-10-CM | POA: Insufficient documentation

## 2019-01-15 DIAGNOSIS — D490 Neoplasm of unspecified behavior of digestive system: Secondary | ICD-10-CM | POA: Diagnosis not present

## 2019-01-15 DIAGNOSIS — K219 Gastro-esophageal reflux disease without esophagitis: Secondary | ICD-10-CM | POA: Insufficient documentation

## 2019-01-15 DIAGNOSIS — K2950 Unspecified chronic gastritis without bleeding: Secondary | ICD-10-CM | POA: Insufficient documentation

## 2019-01-15 DIAGNOSIS — Z6841 Body Mass Index (BMI) 40.0 and over, adult: Secondary | ICD-10-CM | POA: Insufficient documentation

## 2019-01-15 DIAGNOSIS — Z87891 Personal history of nicotine dependence: Secondary | ICD-10-CM | POA: Insufficient documentation

## 2019-01-15 DIAGNOSIS — R590 Localized enlarged lymph nodes: Secondary | ICD-10-CM | POA: Diagnosis not present

## 2019-01-15 DIAGNOSIS — K869 Disease of pancreas, unspecified: Secondary | ICD-10-CM

## 2019-01-15 DIAGNOSIS — K3189 Other diseases of stomach and duodenum: Secondary | ICD-10-CM | POA: Diagnosis not present

## 2019-01-15 DIAGNOSIS — B9681 Helicobacter pylori [H. pylori] as the cause of diseases classified elsewhere: Secondary | ICD-10-CM | POA: Insufficient documentation

## 2019-01-15 DIAGNOSIS — K8689 Other specified diseases of pancreas: Secondary | ICD-10-CM | POA: Diagnosis present

## 2019-01-15 HISTORY — PX: ESOPHAGOGASTRODUODENOSCOPY (EGD) WITH PROPOFOL: SHX5813

## 2019-01-15 HISTORY — PX: FINE NEEDLE ASPIRATION: SHX5430

## 2019-01-15 HISTORY — PX: UPPER ESOPHAGEAL ENDOSCOPIC ULTRASOUND (EUS): SHX6562

## 2019-01-15 HISTORY — PX: BIOPSY: SHX5522

## 2019-01-15 LAB — PREGNANCY, URINE: Preg Test, Ur: NEGATIVE

## 2019-01-15 SURGERY — UPPER ESOPHAGEAL ENDOSCOPIC ULTRASOUND (EUS)
Anesthesia: Monitor Anesthesia Care

## 2019-01-15 MED ORDER — PROPOFOL 500 MG/50ML IV EMUL
INTRAVENOUS | Status: DC | PRN
  Administered 2019-01-15: 145 ug/kg/min via INTRAVENOUS

## 2019-01-15 MED ORDER — ACETAMINOPHEN 500 MG PO TABS
ORAL_TABLET | ORAL | Status: AC
  Filled 2019-01-15: qty 1

## 2019-01-15 MED ORDER — MIDAZOLAM HCL 2 MG/2ML IJ SOLN
INTRAMUSCULAR | Status: AC
  Filled 2019-01-15: qty 2

## 2019-01-15 MED ORDER — PROPOFOL 500 MG/50ML IV EMUL
INTRAVENOUS | Status: DC | PRN
  Administered 2019-01-15: 20 mg via INTRAVENOUS
  Administered 2019-01-15: 50 mg via INTRAVENOUS

## 2019-01-15 MED ORDER — ACETAMINOPHEN 500 MG PO TABS
500.0000 mg | ORAL_TABLET | Freq: Once | ORAL | Status: AC
  Administered 2019-01-15: 500 mg via ORAL

## 2019-01-15 MED ORDER — PROPOFOL 10 MG/ML IV BOLUS
INTRAVENOUS | Status: AC
  Filled 2019-01-15: qty 60

## 2019-01-15 MED ORDER — ONDANSETRON HCL 4 MG/2ML IJ SOLN
INTRAMUSCULAR | Status: DC | PRN
  Administered 2019-01-15: 4 mg via INTRAVENOUS

## 2019-01-15 MED ORDER — GLYCOPYRROLATE 0.2 MG/ML IJ SOLN
INTRAMUSCULAR | Status: DC | PRN
  Administered 2019-01-15: 0.1 mg via INTRAVENOUS

## 2019-01-15 MED ORDER — PANTOPRAZOLE SODIUM 40 MG PO TBEC: 40.0000 mg | DELAYED_RELEASE_TABLET | Freq: Two times a day (BID) | ORAL | 4 refills | Status: DC

## 2019-01-15 MED ORDER — MIDAZOLAM HCL 5 MG/5ML IJ SOLN
INTRAMUSCULAR | Status: DC | PRN
  Administered 2019-01-15: 2 mg via INTRAVENOUS

## 2019-01-15 MED ORDER — PROPOFOL 10 MG/ML IV BOLUS
INTRAVENOUS | Status: AC
  Filled 2019-01-15: qty 40

## 2019-01-15 MED ORDER — LACTATED RINGERS IV SOLN
INTRAVENOUS | Status: DC
  Administered 2019-01-15: 1000 mL via INTRAVENOUS

## 2019-01-15 MED ORDER — ONDANSETRON HCL 4 MG/2ML IJ SOLN
4.0000 mg | Freq: Once | INTRAMUSCULAR | Status: AC
  Administered 2019-01-15: 4 mg via INTRAVENOUS

## 2019-01-15 MED ORDER — SODIUM CHLORIDE 0.9 % IV SOLN: INTRAVENOUS | Status: DC

## 2019-01-15 NOTE — Progress Notes (Signed)
1000- pt has been resting comfortably. She states her head and nausea are better. Pt ready for discharge VSS

## 2019-01-15 NOTE — Anesthesia Postprocedure Evaluation (Signed)
Anesthesia Post Note  Patient: Kathryn Stevenson  Procedure(s) Performed: UPPER ESOPHAGEAL ENDOSCOPIC ULTRASOUND (EUS) (N/A ) BIOPSY FINE NEEDLE ASPIRATION (FNA) LINEAR     Patient location during evaluation: PACU Anesthesia Type: MAC Level of consciousness: awake and alert Pain management: pain level controlled Vital Signs Assessment: post-procedure vital signs reviewed and stable Respiratory status: spontaneous breathing, nonlabored ventilation, respiratory function stable and patient connected to nasal cannula oxygen Cardiovascular status: stable and blood pressure returned to baseline Postop Assessment: no apparent nausea or vomiting Anesthetic complications: no    Last Vitals:  Vitals:   01/15/19 1000 01/15/19 1002  BP:  (!) 156/82  Pulse: (!) 50 (!) 51  Resp: 15 18  Temp:    SpO2: 97% 98%    Last Pain:  Vitals:   01/15/19 1002  TempSrc:   PainSc: 0-No pain                 ODDONO,ERNEST

## 2019-01-15 NOTE — H&P (Signed)
GASTROENTEROLOGY OUTPATIENT PROCEDURE H&P NOTE   Primary Care Physician: [name], FNP  HPI: Kathryn Stevenson is a 43 y.o. female who presents for EGD/EUS.  Past Medical History:  Diagnosis Date  . Acid reflux   . Dermatitis   . Fracture, ankle   . GERD (gastroesophageal reflux disease)   . Headache   . Hepatic hemangioma   . Hepatitis B   . Morbid obesity ([place])   . Pancreatic lesion 06/2018   seen on MR of the liver    Past Surgical History:  Procedure Laterality Date  . ANKLE SURGERY Left   . INDUCED ABORTION    . MOUTH SURGERY    . TUBAL LIGATION     Current Facility-Administered Medications  Medication Dose Route Frequency Provider Last Rate Last Dose  . 0.9 %  sodium chloride infusion   Intravenous Continuous Mansouraty, Telford Nab., MD      . lactated ringers infusion   Intravenous Continuous Mansouraty, Telford Nab., MD 10 mL/hr at 01/15/19 0710 1,000 mL at 01/15/19 0710   No Known Allergies Family History  Problem Relation Age of Onset  . Hypertension Mother   . Ovarian cancer Maternal Grandmother   . Heart attack Maternal Grandmother   . Esophageal cancer Neg Hx   . Colon cancer Neg Hx   . Pancreatic cancer Neg Hx   . Stomach cancer Neg Hx   . Liver disease Neg Hx    Social History   Socioeconomic History  . Marital status: Single    Spouse name: Not on file  . Number of children: Not on file  . Years of education: Not on file  . Highest education level: Not on file  Occupational History  . Not on file  Social Needs  . Financial resource strain: Not on file  . Food insecurity    Worry: Not on file    Inability: Not on file  . Transportation needs    Medical: Not on file    Non-medical: Not on file  Tobacco Use  . Smoking status: Former Smoker    Packs/day: 0.50    Types: Cigarettes    Quit date: 12/11/2016    Years since quitting: 2.0  . Smokeless tobacco: Never Used  Substance and Sexual Activity  . Alcohol use: No  . Drug use:  No  . Sexual activity: Yes    Partners: Male    Birth control/protection: Surgical  Lifestyle  . Physical activity    Days per week: Not on file    Minutes per session: Not on file  . Stress: Not on file  Relationships  . Social Herbalist on phone: Not on file    Gets together: Not on file    Attends religious service: Not on file    Active member of club or organization: Not on file    Attends meetings of clubs or organizations: Not on file    Relationship status: Not on file  . Intimate partner violence    Fear of current or ex partner: Not on file    Emotionally abused: Not on file    Physically abused: Not on file    Forced sexual activity: Not on file  Other Topics Concern  . Not on file  Social History Narrative  . Not on file    Physical Exam: Vital signs in last 24 hours: Temp:  [97.9 F (36.6 C)] 97.9 F (36.6 C) (07/29 0652) Pulse Rate:  [54] 54 (  07/29 1448) Resp:  [16] 16 (07/29 1856) BP: (129)/(67) 129/67 (07/29 0652) SpO2:  [97 %] 97 % (07/29 0652) Weight:  [158.3 kg] 158.3 kg (07/29 0652)   GEN: NAD EYE: Sclerae anicteric ENT: MMM CV: RR without R/Gs  GI: Soft, NT/ND NEURO:  Alert & Oriented x 3  Lab Results: No results for input(s): WBC, HGB, HCT, PLT in the last 72 hours. BMET No results for input(s): NA, K, CL, CO2, GLUCOSE, BUN, CREATININE, CALCIUM in the last 72 hours. LFT No results for input(s): PROT, ALBUMIN, AST, ALT, ALKPHOS, BILITOT, BILIDIR, IBILI in the last 72 hours. PT/INR No results for input(s): LABPROT, INR in the last 72 hours.   Impression / Plan: This is a 43 y.o.female who presents for EGD/EUS.  The risks and benefits of endoscopic evaluation were discussed with the patient; these include but are not limited to the risk of perforation, infection, bleeding, missed lesions, lack of diagnosis, severe illness requiring hospitalization, as well as anesthesia and sedation related illnesses.  The patient is agreeable  to proceed.   The risks of EUS including bleeding, infection, aspiration pneumonia and intestinal perforation were discussed as was the possibility it may not give a definitive diagnosis.  If a biopsy of the pancreas is done as part of the EUS, there is an additional risk of pancreatitis at the rate of about 1%.  It was explained that procedure related pancreatitis is typically mild, although can be severe and even life threatening, which is why we do not perform random pancreatic biopsies and only biopsy a lesion we feel is concerning enough to warrant the risk.    Justice Britain, MD [facility] Office # [phone]

## 2019-01-15 NOTE — Discharge Instructions (Signed)
YOU HAD AN ENDOSCOPIC PROCEDURE TODAY: Refer to the procedure report and other information in the discharge instructions given to you for any specific questions about what was found during the examination. If this information does not answer your questions, please call [place] office at [phone] to clarify.  ° °YOU SHOULD EXPECT: Some feelings of bloating in the abdomen. Passage of more gas than usual. Walking can help get rid of the air that was put into your GI tract during the procedure and reduce the bloating. If you had a lower endoscopy (such as a colonoscopy or flexible sigmoidoscopy) you may notice spotting of blood in your stool or on the toilet paper. Some abdominal soreness may be present for a day or two, also. ° °DIET: Your first meal following the procedure should be a light meal and then it is ok to progress to your normal diet. A half-sandwich or bowl of soup is an example of a good first meal. Heavy or fried foods are harder to digest and may make you feel nauseous or bloated. Drink plenty of fluids but you should avoid alcoholic beverages for 24 hours. If you had a esophageal dilation, please see attached instructions for diet.   ° °ACTIVITY: Your care partner should take you home directly after the procedure. You should plan to take it easy, moving slowly for the rest of the day. You can resume normal activity the day after the procedure however YOU SHOULD NOT DRIVE, use power tools, machinery or perform tasks that involve climbing or major physical exertion for 24 hours (because of the sedation medicines used during the test).  ° °SYMPTOMS TO REPORT IMMEDIATELY: °A gastroenterologist can be reached at any hour. Please call [phone]  for any of the following symptoms:  °Following lower endoscopy (colonoscopy, flexible sigmoidoscopy) °Excessive amounts of blood in the stool  °Significant tenderness, worsening of abdominal pains  °Swelling of the abdomen that is new, acute  °Fever of 100° or  higher  °Following upper endoscopy (EGD, EUS, ERCP, esophageal dilation) °Vomiting of blood or coffee ground material  °New, significant abdominal pain  °New, significant chest pain or pain under the shoulder blades  °Painful or persistently difficult swallowing  °New shortness of breath  °Black, tarry-looking or red, bloody stools ° °FOLLOW UP:  °If any biopsies were taken you will be contacted by phone or by letter within the next 1-3 weeks. Call [phone]  if you have not heard about the biopsies in 3 weeks.  °Please also call with any specific questions about appointments or follow up tests. ° °

## 2019-01-15 NOTE — Transfer of Care (Signed)
Immediate Anesthesia Transfer of Care Note  Patient: Kathryn Stevenson  Procedure(s) Performed: Procedure(s): UPPER ESOPHAGEAL ENDOSCOPIC ULTRASOUND (EUS) (N/A) BIOPSY  Patient Location: PACU  Anesthesia Type:MAC  Level of Consciousness:  sedated, patient cooperative and responds to stimulation  Airway & Oxygen Therapy:Patient Spontanous Breathing and Patient connected to face mask oxgen  Post-op Assessment:  Report given to PACU RN and Post -op Vital signs reviewed and stable  Post vital signs:  Reviewed and stable  Last Vitals:  Vitals:   01/15/19 0652  BP: 129/67  Pulse: (!) 54  Resp: 16  Temp: 36.6 C  SpO2: 63%    Complications: No apparent anesthesia complications

## 2019-01-15 NOTE — Op Note (Signed)
[facility] Patient Name: Kathryn Stevenson Procedure Date: 01/15/2019 MRN: [id number] Attending MD: Justice Britain , MD Date of Birth: 10-16-1975 CSN: [id number] Age: 43 Admit Type: Outpatient Procedure:                Upper EUS Indications:              Suspected mass in pancreas on CT scan (Uncinate vs                            Head) Providers:                Justice Britain, MD, Cleda Daub, RN, Ashley Jacobs, RN, Elspeth Cho Tech., Technician,                            Arnoldo Hooker, CRNA Referring MD:             Estill Cotta. Loletha Carrow, MD Medicines:                Monitored Anesthesia Care Complications:            No immediate complications. Estimated Blood Loss:     Estimated blood loss was minimal. Procedure:                Pre-Anesthesia Assessment:                           - Prior to the procedure, a History and Physical                            was performed, and patient medications and                            allergies were reviewed. The patient's tolerance of                            previous anesthesia was also reviewed. The risks                            and benefits of the procedure and the sedation                            options and risks were discussed with the patient.                            All questions were answered, and informed consent                            was obtained. Prior Anticoagulants: The patient has                            taken no previous anticoagulant or antiplatelet  agents except for NSAID medication. ASA Grade                            Assessment: III - A patient with severe systemic                            disease. After reviewing the risks and benefits,                            the patient was deemed in satisfactory condition to                            undergo the procedure.                           After obtaining informed consent, the  endoscope was                            passed under direct vision. Throughout the                            procedure, the patient's blood pressure, pulse, and                            oxygen saturations were monitored continuously. The                            [id number]-H190 ([id number]) Olympus gastroscope was                            introduced through the mouth, and advanced to the                            second part of duodenum. The [id number]-Q180V ([id number])                            Olympus duodenoscope was introduced through the                            mouth, and advanced to the second part of duodenum.                            The GF-UTC180 ([id number]) Olympus Linear EUS was                            introduced through the mouth, and advanced to the                            duodenum for ultrasound examination from the                            stomach and duodenum. The upper EUS was  accomplished without difficulty. The patient                            tolerated the procedure. Scope In: Scope Out: Findings:      ENDOSCOPIC FINDING: :      No gross lesions were noted in the entire esophagus.      The Z-line was regular and was found 36 cm from the incisors.      Multiple dispersed, small non-bleeding erosions were found in the       gastric body and in the gastric antrum. There were no stigmata of recent       bleeding.      One non-bleeding superficial gastric ulcer with a clean ulcer base       (Forrest Class III) was found in the gastric antrum. The lesion was 7 mm       in largest dimension.      Patchy moderately erythematous mucosa without bleeding was found in the       gastric body and in the gastric antrum.      No other gross lesions were noted in the entire examined stomach.       Biopsies were taken with a cold forceps for histology and Helicobacter       pylori testing.      No gross lesions were noted in the duodenal bulb, in the  first portion       of the duodenum, in the second portion of the duodenum and in the major       papilla.      ENDOSONOGRAPHIC FINDING: :      An irregular mass was identified in the pancreatic head and uncinate       process of the pancreas. The mass was heterogenous - mostlyhypoechoic       though in some areas had a hyperechogenicity to it. The mass measured 37       mm by 21 mm in maximal cross-sectional diameter. The endosonographic       borders were well-defined. No evidence of any cystic component present.       An intact interface was seen between the mass and the superior       mesenteric artery and celiac trunk suggesting a lack of invasion. The       remainder of the pancreas was examined. The endosonographic appearance       of parenchyma and the upstream pancreatic duct indicated a normal       appearing duct (PD Head = 0.5 mm, PD Neck = 1.2 mm, PD Body = 0.7 mm)       and that the remainder of the pancreas was unremarkable. Fine needle       biopsy was performed. Color Doppler imaging was utilized prior to needle       puncture to confirm a lack of significant vascular structures within the       needle path. Six passes were made with the Aquire 25 gauge ultrasound       core biopsy needle using a transduodenal approach. A visible core of       tissue was obtained. Preliminary cytologic examination and touch preps       were performed. Final cytology results are pending.      One enlarged lymph node was visualized in the para-aortic supra-celiac       region. It measured 10 mm by 8 mm  in maximal cross-sectional diameter.       The node was triangular, isoechoic and had well defined margins. Fine       needle biopsy was performed. Color Doppler imaging was utilized prior to       needle puncture to confirm a lack of significant vascular structures       within the needle path. Two passes were made with the Aquire 25 gauge       ultrasound core biopsy needle using a  transgastric approach. A visible       core of tissue was obtained. A preliminary cytologic examination was       performed. Final cytology results are pending.      There was no sign of significant endosonographic abnormality in the       common bile duct (3.3 mm). An unremarkable gallbladder and cystic duct       was identified.      Endosonographic imaging in the visualized portion of the liver showed no       mass-lesion.      The celiac region was visualized. Impression:               EGD Impression:                           - No gross lesions in esophagus.                           - Z-line regular, 36 cm from the incisors.                           - Non-bleeding erosive gastropathy. Non-bleeding                            gastric ulcer with a clean ulcer base (Forrest                            Class III). Erythematous mucosa in the gastric body                            and antrum. Biopsied for HP.                           - No gross lesions in the duodenal bulb, in the                            first portion of the duodenum, in the second                            portion of the duodenum and in the major papilla.                           EUS Impression:                           - A mass was identified in the pancreatic  head/uncinate region of the pancreas. Fine needle                            biopsy performed. 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                           - One enlarged lymph node was visualized in the                            para-aortic supra celiac region. The diagnosis is                            benign inflammatory changes. Fine needle biopsy                            performed.                           - There was no  sign of significant pathology in the                            common bile duct or gallbaldder. Moderate Sedation:      Not Applicable - Patient had care per Anesthesia. Recommendation:           - The patient will be observed post-procedure,                            until all discharge criteria are met.                           - Discharge patient to home.                           - Patient has a contact number available for                            emergencies. The signs and symptoms of potential                            delayed complications were discussed with the                            patient. Return to normal activities tomorrow.                            Written discharge instructions were provided to  the                            patient.                           - Await cytology results and await path results.                           - Increase PPI to 40 mg BID in interim.                           - Minimize NSAID use as able.                           - The findings and recommendations were discussed                            with the patient.                           - The findings and recommendations were discussed                            with the patient's family. Procedure Code(s):        --- Professional ---                           [phone], Esophagogastroduodenoscopy, flexible,                            transoral; with transendoscopic ultrasound-guided                            intramural or transmural fine needle                            aspiration/biopsy(s), (includes endoscopic                            ultrasound examination limited to the esophagus,                            stomach or duodenum, and adjacent structures) Diagnosis Code(s):        --- Professional ---                           K31.89, Other diseases of stomach and duodenum                           K25.9, Gastric ulcer, unspecified as acute or                             chronic, without hemorrhage or perforation                           K86.89, Other specified diseases of pancreas  R59.0, Localized enlarged lymph nodes                           R93.3, Abnormal findings on diagnostic imaging of                            other parts of digestive tract CPT copyright 2019 American Medical Association. All rights reserved. The codes documented in this report are preliminary and upon coder review may  be revised to meet current compliance requirements. Justice Britain, MD 01/15/2019 9:39:20 AM Number of Addenda: 0

## 2019-01-15 NOTE — Progress Notes (Signed)
0920 pt out from procedure room feeling nauseous no vomiting. Pt stated had headache. 4mg  Zofran given IV.   0935 pt stated nausea was better but still had a bad headache. Dr. Rush Landmark by to see pt. Talked with patient regarding nausea and headache. Order for 500mg  Tylenol and give some more IV fluid to help with hydration.  0940-pt stated she feels better. She is resting more comfortably. Dr. Rush Landmark by to speak with pt.

## 2019-01-16 ENCOUNTER — Encounter: Payer: Self-pay | Admitting: Gastroenterology

## 2019-01-17 ENCOUNTER — Other Ambulatory Visit: Payer: Self-pay

## 2019-01-17 MED ORDER — PYLERA 140-125-125 MG PO CAPS: 3.0000 | ORAL_CAPSULE | Freq: Three times a day (TID) | ORAL | 0 refills | Status: DC

## 2019-01-21 ENCOUNTER — Encounter: Payer: Self-pay | Admitting: *Deleted

## 2019-01-21 ENCOUNTER — Telehealth: Payer: Self-pay | Admitting: Gastroenterology

## 2019-01-21 ENCOUNTER — Other Ambulatory Visit: Payer: Self-pay | Admitting: *Deleted

## 2019-01-21 DIAGNOSIS — R935 Abnormal findings on diagnostic imaging of other abdominal regions, including retroperitoneum: Secondary | ICD-10-CM

## 2019-01-21 NOTE — Telephone Encounter (Signed)
I spoke with the pt and all questions were answered regarding pylera.  She states that the pharmacy only gave her 10 days worth of pylera and told her that 10 days was enough.  She has started the 10 days.  Information was sent regarding Urea breath test and the pt will call back with any questions after receiving the written information.  FYI Dr Rush Landmark see message regarding pylera

## 2019-01-23 NOTE — Telephone Encounter (Signed)
10-days of Pylera should be fine. Thanks. GM

## 2019-01-23 NOTE — Telephone Encounter (Signed)
Pt aware that 10 days will be ok.  The pt has been advised of the information and verbalized understanding.

## 2019-01-27 ENCOUNTER — Telehealth: Payer: Self-pay | Admitting: Gastroenterology

## 2019-01-27 ENCOUNTER — Encounter: Payer: Self-pay | Admitting: Family Medicine

## 2019-01-27 ENCOUNTER — Ambulatory Visit (INDEPENDENT_AMBULATORY_CARE_PROVIDER_SITE_OTHER): Payer: 59 | Admitting: Family Medicine

## 2019-01-27 ENCOUNTER — Other Ambulatory Visit: Payer: Self-pay

## 2019-01-27 VITALS — BP 118/77 | HR 64 | Temp 98.7°F | Resp 14 | Ht 66.0 in | Wt 348.0 lb

## 2019-01-27 DIAGNOSIS — R609 Edema, unspecified: Secondary | ICD-10-CM

## 2019-01-27 DIAGNOSIS — K869 Disease of pancreas, unspecified: Secondary | ICD-10-CM

## 2019-01-27 DIAGNOSIS — B169 Acute hepatitis B without delta-agent and without hepatic coma: Secondary | ICD-10-CM

## 2019-01-27 DIAGNOSIS — R933 Abnormal findings on diagnostic imaging of other parts of digestive tract: Secondary | ICD-10-CM

## 2019-01-27 MED ORDER — HYDROCHLOROTHIAZIDE 12.5 MG PO TABS: 12.5000 mg | ORAL_TABLET | Freq: Every day | ORAL | 3 refills | Status: DC

## 2019-01-27 NOTE — Telephone Encounter (Signed)
I called and spoke with the patient on 01/23/2019. We discussed the results of her cytology from FNB of the lymph node as well as from the pancreatic head/uncinate lesion. 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 She is doing and feeling well at this time. I would like to have this patient added onto our multidisciplinary conference within the next few weeks discussed her case and consider the role of surgical resection of this lesion. If repeat FNB is needed with a larger gauge needle I would be happy to proceed with this if it is felt necessary however based on the clinical presentation and imaging studies I believe that surgical intervention is the next step. I will update her primary GI as well Dr. Rachael Darby. I will place a referral to Dr. Barry Dienes as a non-emergent referral to discuss surgical options as well. Patient appreciative for call back and discussion.  She is actively being treated for H. pylori that we found as well. She is going out of town for the next few days but feels better about the situation knowing that there is a plan moving forward. She is appreciative for all the care that she is received from of our GI in the last 8 months.   Justice Britain, MD [facility] # [phone]

## 2019-01-27 NOTE — Progress Notes (Signed)
  Patient [facility]   Progress Note: Sick Visit Provider: [name], FNP  SUBJECTIVE:   Kathryn Stevenson is a 43 y.o. female who  has a past medical history of Acid reflux, Dermatitis, Fracture, ankle, GERD (gastroesophageal reflux disease), Headache, Hepatic hemangioma, Hepatitis B, Morbid obesity ([place]), and Pancreatic lesion (06/2018).. Patient presents today for Edema (both legs and feet )  Patient states that she has swelling of both lower extremities. She states that she has been eating fast food. Noticed the increase afterward. She states that most of the swelling has resolved today. Denies chest pain, sob, dizziness.  Review of Systems  Constitutional: Negative.   HENT: Negative.   Eyes: Negative.   Respiratory: Negative.   Cardiovascular: Positive for leg swelling.  Gastrointestinal: Negative.   Genitourinary: Negative.   Musculoskeletal: Negative.   Skin: Negative.   Neurological: Negative.   Psychiatric/Behavioral: Negative.      OBJECTIVE: BP 118/77 (BP Location: Left Arm, Patient Position: Sitting, Cuff Size: Large)   Pulse 64   Temp 98.7 F (37.1 C) (Oral)   Resp 14   Ht 5\' 6"  (1.676 m)   Wt (!) 348 lb (157.9 kg)   LMP 01/13/2019 Comment: urine preg gotten  SpO2 98%   BMI 56.17 kg/m   Wt Readings from Last 3 Encounters:  01/27/19 (!) 348 lb (157.9 kg)  01/15/19 (!) 348 lb 15.8 oz (158.3 kg)  12/31/18 (!) 349 lb (158.3 kg)     Physical Exam Vitals signs and nursing note reviewed.  Constitutional:      General: She is not in acute distress.    Appearance: Normal appearance.  HENT:     Head: Normocephalic and atraumatic.  Eyes:     Extraocular Movements: Extraocular movements intact.     Conjunctiva/sclera: Conjunctivae normal.     Pupils: Pupils are equal, round, and reactive to light.  Cardiovascular:     Rate and Rhythm: Normal rate and regular rhythm.     Heart sounds: No murmur.  Pulmonary:   Effort: Pulmonary effort is normal.     Breath sounds: Normal breath sounds.  Musculoskeletal: Normal range of motion.  Skin:    General: Skin is warm and dry.  Neurological:     Mental Status: She is alert and oriented to person, place, and time.  Psychiatric:        Mood and Affect: Mood normal.        Behavior: Behavior normal.        Thought Content: Thought content normal.        Judgment: Judgment normal.     ASSESSMENT/PLAN:   1. Edema, unspecified type - hydrochlorothiazide (HYDRODIURIL) 12.5 MG tablet; Take 1 tablet (12.5 mg total) by mouth daily.  Dispense: 90 tablet; Refill: 3  Discussed decreasing sodium intake. Identified high sodium foods. Encourage to elevate lower extremities while seated.       The patient was given clear instructions to go to ER or return to medical center if symptoms do not improve, worsen or new problems develop. The patient verbalized understanding and agreed with plan of care.   [name], FNP-BC [facility] [address] [place], [place] [address] [phone]     This note has been created with Dragon speech recognition software and smart phrase technology. Any transcriptional errors are unintentional.

## 2019-01-27 NOTE — Telephone Encounter (Signed)
Referral to [facility] has been made. Records faxed.   

## 2019-01-27 NOTE — Progress Notes (Signed)
Lab order in Epic and letter mailed

## 2019-01-28 NOTE — Telephone Encounter (Signed)
Gabe,    Thanks for your diligent follow-up on this patient.  - HD

## 2019-02-05 ENCOUNTER — Other Ambulatory Visit: Payer: Self-pay

## 2019-02-05 ENCOUNTER — Encounter: Payer: Self-pay | Admitting: Gastroenterology

## 2019-02-05 DIAGNOSIS — K869 Disease of pancreas, unspecified: Secondary | ICD-10-CM

## 2019-02-05 NOTE — Progress Notes (Signed)
Lab order in Epic and message sent to [facility] to inquire about appt. Left message on machine to call back

## 2019-02-05 NOTE — Progress Notes (Signed)
Patient's case was discussed at [facility] this morning. Pathology reviewed. 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 Awaiting discussion with Surgery to consider resection. Patty, please let patient know that nothing has changed with our plan for considering resection, however, we would like to get a Chromogranin level and she can come in for this at her convenience. Her PPI use may cause a slight increase in the level, but it is worthwhile to go ahead and get at her convenience. Can you find out how the status of her referral as well? Thanks. GM

## 2019-02-05 NOTE — Telephone Encounter (Signed)
appt is on 03/07/19 at 9:00 With dr.Byerly as of now she is also on the wait list

## 2019-02-05 NOTE — Progress Notes (Signed)
The patient has been notified of this information and all questions answered.  The pt will come in tomorrow for lab

## 2019-02-05 NOTE — Progress Notes (Signed)
Waiting for appt with [facility]

## 2019-02-10 ENCOUNTER — Encounter: Payer: Self-pay | Admitting: Family Medicine

## 2019-02-10 NOTE — Patient Instructions (Signed)
Edema  Edema is when you have too much fluid in your body or under your skin. Edema may make your legs, feet, and ankles swell up. Swelling is also common in looser tissues, like around your eyes. This is a common condition. It gets more common as you get older. There are many possible causes of edema. Eating too much salt (sodium) and being on your feet or sitting for a long time can cause edema in your legs, feet, and ankles. Hot weather may make edema worse. Edema is usually painless. Your skin may look swollen or shiny. Follow these instructions at home:  Keep the swollen body part raised (elevated) above the level of your heart when you are sitting or lying down.  Do not sit still or stand for a long time.  Do not wear tight clothes. Do not wear garters on your upper legs.  Exercise your legs. This can help the swelling go down.  Wear elastic bandages or support stockings as told by your doctor.  Eat a low-salt (low-sodium) diet to reduce fluid as told by your doctor.  Depending on the cause of your swelling, you may need to limit how much fluid you drink (fluid restriction).  Take over-the-counter and prescription medicines only as told by your doctor. Contact a doctor if:  Treatment is not working.  You have heart, liver, or kidney disease and have symptoms of edema.  You have sudden and unexplained weight gain. Get help right away if:  You have shortness of breath or chest pain.  You cannot breathe when you lie down.  You have pain, redness, or warmth in the swollen areas.  You have heart, liver, or kidney disease and get edema all of a sudden.  You have a fever and your symptoms get worse all of a sudden. Summary  Edema is when you have too much fluid in your body or under your skin.  Edema may make your legs, feet, and ankles swell up. Swelling is also common in looser tissues, like around your eyes.  Raise (elevate) the swollen body part above the level of your  heart when you are sitting or lying down.  Follow your doctor's instructions about diet and how much fluid you can drink (fluid restriction). This information is not intended to replace advice given to you by your health care provider. Make sure you discuss any questions you have with your health care provider. Document Released: 11/22/2007 Document Revised: 06/08/2017 Document Reviewed: 06/23/2016 Elsevier Patient Education  2020 Elsevier Inc.  

## 2019-02-14 ENCOUNTER — Other Ambulatory Visit: Payer: 59

## 2019-02-14 DIAGNOSIS — K869 Disease of pancreas, unspecified: Secondary | ICD-10-CM

## 2019-02-18 LAB — CHROMOGRANIN A: Chromogranin A: 109 ng/mL (ref 25–140)

## 2019-02-26 ENCOUNTER — Encounter (HOSPITAL_COMMUNITY): Payer: Self-pay | Admitting: *Deleted

## 2019-02-26 ENCOUNTER — Encounter (HOSPITAL_COMMUNITY): Payer: Self-pay

## 2019-02-28 ENCOUNTER — Telehealth: Payer: Self-pay

## 2019-02-28 ENCOUNTER — Encounter: Payer: Self-pay | Admitting: Gastroenterology

## 2019-02-28 DIAGNOSIS — A048 Other specified bacterial intestinal infections: Secondary | ICD-10-CM

## 2019-02-28 NOTE — Telephone Encounter (Signed)
I spoke to the patient, she is a little reluctant about having to do more antibiotics. She states she feels great. No current problems or complaints. She is still on her PPI daily.  She would like your input for repeat treat or not.  Please advise      Patient underwent a H. pylori breath test on 02/20/2019 [facility] has returned the results and it has returned with a positive result suggesting that patient still has H. pylori even after completing 10 days of Pylera treatment. 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 Covering for Dr. Loletha Carrow who is out this week.  A levofloxacin triple based therapy should be considered.  This will include: Levofloxacin 500 mg daily Amoxicillin 1000 mg twice daily  Protonix 40 mg twice daily Total dosing for 14 days. PPI to go to once daily dosing for next 2 weeks thereafter. Patient should then stop PPI for 2 weeks. Repeat urea breath test or stool H. pylori antigen should be performed.  Please discuss this with the patient and let her know that is what I would recommend at this point in time. She can follow-up with Dr. Loletha Carrow after completion of the antibiotics and having the breath test/stool antigen performed. If patient continues to have positive H. pylori then would consider Levaquin based quadruple therapy which has less known efficacy but that as well as an infectious disease consultation may be required.  Justice Britain, MD [facility] # [phone]

## 2019-02-28 NOTE — Progress Notes (Signed)
Patient underwent a H. pylori breath test on 02/20/2019 [facility] has returned the results and it has returned with a positive result suggesting that patient still has H. pylori even after completing 10 days of Pylera treatment. 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 Covering for Dr. Loletha Carrow who is out this week.  A levofloxacin triple based therapy should be considered.  This will include: Levofloxacin 500 mg daily Amoxicillin 1000 mg twice daily  Protonix 40 mg twice daily Total dosing for 14 days. PPI to go to once daily dosing for next 2 weeks thereafter. Patient should then stop PPI for 2 weeks. Repeat urea breath test or stool H. pylori antigen should be performed.  Please discuss this with the patient and let her know that is what I would recommend at this point in time. She can follow-up with Dr. Loletha Carrow after completion of the antibiotics and having the breath test/stool antigen performed. If patient continues to have positive H. pylori then would consider Levaquin based quadruple therapy which has less known efficacy but that as well as an infectious disease consultation may be required.  Justice Britain, MD [facility] # [phone]

## 2019-03-03 NOTE — Telephone Encounter (Signed)
I agree with the recommendation and medicine plan for re-treatment of the H pylori infection as outlined by. Dr. Rush Landmark.  Then repeat breath test afterwards as well.  This stomach bacteria was the most likely cause for stomach erosions seen on her EGD/EUS, and it also increases the risk of stomach cancer.  So we should try to eradicate it.

## 2019-03-04 MED ORDER — LEVOFLOXACIN 500 MG PO TABS: 500.0000 mg | ORAL_TABLET | Freq: Every day | ORAL | 0 refills | Status: DC

## 2019-03-04 MED ORDER — PANTOPRAZOLE SODIUM 40 MG PO TBEC: 40.0000 mg | DELAYED_RELEASE_TABLET | Freq: Every day | ORAL | 0 refills | Status: DC

## 2019-03-04 MED ORDER — AMOXICILLIN 500 MG PO TABS: ORAL_TABLET | ORAL | 0 refills | Status: DC

## 2019-03-04 NOTE — Telephone Encounter (Signed)
Patient has been notified and aware. She agrees to repeat the antibiotic treatment. Order for breath test also ordered and mailed to patients home address.

## 2019-03-07 ENCOUNTER — Other Ambulatory Visit: Payer: Self-pay | Admitting: General Surgery

## 2019-03-24 ENCOUNTER — Encounter (HOSPITAL_COMMUNITY): Payer: Self-pay | Admitting: Emergency Medicine

## 2019-03-24 ENCOUNTER — Telehealth: Payer: Self-pay | Admitting: Gastroenterology

## 2019-03-24 ENCOUNTER — Ambulatory Visit (HOSPITAL_COMMUNITY)
Admission: EM | Admit: 2019-03-24 | Discharge: 2019-03-24 | Disposition: A | Discharge status: Home / Self Care | Payer: 59 | Attending: Emergency Medicine | Admitting: Emergency Medicine

## 2019-03-24 ENCOUNTER — Other Ambulatory Visit: Payer: Self-pay

## 2019-03-24 DIAGNOSIS — M25561 Pain in right knee: Secondary | ICD-10-CM

## 2019-03-24 MED ORDER — IBUPROFEN 800 MG PO TABS: 800.0000 mg | ORAL_TABLET | Freq: Three times a day (TID) | ORAL | 0 refills | Status: DC | PRN

## 2019-03-24 NOTE — ED Provider Notes (Signed)
[facility]    CSN: [id number] Arrival date & time: 03/24/19  [id number]      History   Chief Complaint Chief Complaint  Patient presents with  . Knee Pain    HPI Kathryn Stevenson is a 43 y.o. female.   Patient presents with right knee pain x4 days.  No falls or known injury.  She states she may have twisted it but does not know.  Her pain is worse with weightbearing and bending her knee.  Her pain improves with rest.  She denies numbness, paresthesias, weakness.  The history is provided by the patient.    Past Medical History:  Diagnosis Date  . Acid reflux   . Dermatitis   . Fracture, ankle   . GERD (gastroesophageal reflux disease)   . Headache   . Hepatic hemangioma   . Hepatitis B   . Morbid obesity ([place])   . Pancreatic lesion 06/2018   seen on MR of the liver     Patient Active Problem List   Diagnosis Date Noted  . Acute hepatitis B   . Elevated LFTs   . Malaise and fatigue   . Liver dysfunction 07/13/2018  . Pancreatic lesion 07/13/2018  . Right leg pain 03/29/2017  . RUQ abdominal pain 06/30/2016  . Liver lesion 06/30/2016  . Gastroesophageal reflux disease 06/30/2016  . Morbid obesity ([place]) 09/20/2015  . Severe obesity (BMI >= 40) ([place]) 09/20/2015  . Dermatitis 09/20/2015    Past Surgical History:  Procedure Laterality Date  . ANKLE SURGERY Left   . BIOPSY  01/15/2019   Procedure: BIOPSY;  Surgeon: Rush Landmark Telford Nab., MD;  Location: [facility];  Service: Gastroenterology;;  . ESOPHAGOGASTRODUODENOSCOPY (EGD) WITH PROPOFOL N/A 01/15/2019   Procedure: ESOPHAGOGASTRODUODENOSCOPY (EGD) WITH PROPOFOL;  Surgeon: Irving Copas., MD;  Location: [facility];  Service: Gastroenterology;  Laterality: N/A;  . FINE NEEDLE ASPIRATION  01/15/2019   Procedure: FINE NEEDLE ASPIRATION (FNA) LINEAR;  Surgeon: Irving Copas., MD;  Location: [facility];  Service: Gastroenterology;;  . INDUCED ABORTION    . MOUTH SURGERY    . TUBAL  LIGATION    . UPPER ESOPHAGEAL ENDOSCOPIC ULTRASOUND (EUS) N/A 01/15/2019   Procedure: UPPER ESOPHAGEAL ENDOSCOPIC ULTRASOUND (EUS);  Surgeon: Irving Copas., MD;  Location: [facility];  Service: Gastroenterology;  Laterality: N/A;    OB History    Gravida  6   Para  2   Term  2   Preterm      AB  4   Living  2     SAB  1   TAB  3   Ectopic      Multiple      Live Births               Home Medications    Prior to Admission medications   Medication Sig Start Date End Date Taking? Authorizing Provider  amoxicillin (AMOXIL) 500 MG tablet 1,000mg  twice a day 03/04/19   Doran Stabler, MD  hydrochlorothiazide (HYDRODIURIL) 12.5 MG tablet Take 1 tablet (12.5 mg total) by mouth daily. 01/27/19   [name], FNP  ibuprofen (ADVIL) 800 MG tablet Take 1 tablet (800 mg total) by mouth every 8 (eight) hours as needed. 03/24/19   Sharion Balloon, NP  levofloxacin (LEVAQUIN) 500 MG tablet Take 1 tablet (500 mg total) by mouth daily. 03/04/19   Doran Stabler, MD  naproxen sodium (ALEVE) 220 MG tablet Take 2 tablets (440 mg  total) by mouth 2 (two) times daily with a meal. Patient taking differently: Take 440 mg by mouth 2 (two) times daily as needed (for pain or headache).  12/16/18   [name], FNP  pantoprazole (PROTONIX) 40 MG tablet Take 1 tablet (40 mg total) by mouth 2 (two) times daily. 01/15/19   Mansouraty, Telford Nab., MD  pantoprazole (PROTONIX) 40 MG tablet Take 1 tablet (40 mg total) by mouth daily. 03/04/19   Doran Stabler, MD    Family History Family History  Problem Relation Age of Onset  . Hypertension Mother   . Ovarian cancer Maternal Grandmother   . Heart attack Maternal Grandmother   . Esophageal cancer Neg Hx   . Colon cancer Neg Hx   . Pancreatic cancer Neg Hx   . Stomach cancer Neg Hx   . Liver disease Neg Hx     Social History Social History   Tobacco Use  . Smoking status: Former Smoker    Packs/day: 0.50    Types:  Cigarettes    Quit date: 12/11/2016    Years since quitting: 2.2  . Smokeless tobacco: Never Used  Substance Use Topics  . Alcohol use: No  . Drug use: No     Allergies   Patient has no known allergies.   Review of Systems Review of Systems  Constitutional: Negative for chills and fever.  HENT: Negative for ear pain and sore throat.   Eyes: Negative for pain and visual disturbance.  Respiratory: Negative for cough and shortness of breath.   Cardiovascular: Negative for chest pain and palpitations.  Gastrointestinal: Negative for abdominal pain and vomiting.  Genitourinary: Negative for dysuria and hematuria.  Musculoskeletal: Positive for arthralgias. Negative for back pain.  Skin: Negative for color change and rash.  Neurological: Negative for seizures, syncope, weakness and numbness.  All other systems reviewed and are negative.    Physical Exam Triage Vital Signs ED Triage Vitals  Enc Vitals Group     BP 03/24/19 1928 115/61     Pulse Rate 03/24/19 1928 67     Resp 03/24/19 1928 18     Temp 03/24/19 1928 (!) 97.5 F (36.4 C)     Temp src --      SpO2 03/24/19 1928 100 %     Weight --      Height --      Head Circumference --      Peak Flow --      Pain Score 03/24/19 1929 10     Pain Loc --      Pain Edu? --      Excl. in [place]? --    No data found.  Updated Vital Signs BP 115/61   Pulse 67   Temp (!) 97.5 F (36.4 C)   Resp 18   LMP 03/06/2019   SpO2 100%   Visual Acuity Right Eye Distance:   Left Eye Distance:   Bilateral Distance:    Right Eye Near:   Left Eye Near:    Bilateral Near:     Physical Exam Vitals signs and nursing note reviewed.  Constitutional:      General: She is not in acute distress.    Appearance: She is well-developed.  HENT:     Head: Normocephalic and atraumatic.  Eyes:     Conjunctiva/sclera: Conjunctivae normal.  Neck:     Musculoskeletal: Neck supple.  Cardiovascular:     Rate and Rhythm: Normal rate and  regular rhythm.  Heart sounds: No murmur.  Pulmonary:     Effort: Pulmonary effort is normal. No respiratory distress.     Breath sounds: Normal breath sounds.  Abdominal:     Palpations: Abdomen is soft.     Tenderness: There is no abdominal tenderness.  Musculoskeletal: Normal range of motion.        General: Tenderness present. No swelling or deformity.     Comments: Generalized tenderness of right knee.    Skin:    General: Skin is warm and dry.     Capillary Refill: Capillary refill takes less than 2 seconds.     Findings: No bruising, erythema, lesion or rash.  Neurological:     General: No focal deficit present.     Mental Status: She is alert and oriented to person, place, and time.     Sensory: No sensory deficit.     Motor: No weakness.     Gait: Gait normal.      UC Treatments / Results  Labs (all labs ordered are listed, but only abnormal results are displayed) Labs Reviewed - No data to display  EKG   Radiology No results found.  Procedures Procedures (including critical care time)  Medications Ordered in UC Medications - No data to display  Initial Impression / Assessment and Plan / UC Course  I have reviewed the triage vital signs and the nursing notes.  Pertinent labs & imaging results that were available during my care of the patient were reviewed by me and considered in my medical decision making (see chart for details).    Acute right knee pain.  Treating with ibuprofen, RICE.  Instructed patient to follow-up with an orthopedist if her symptoms persist or worsen or she develops new symptoms such as paresthesias, numbness, weakness.  Patient agrees to plan of care.     Final Clinical Impressions(s) / UC Diagnoses   Final diagnoses:  Acute pain of right knee     Discharge Instructions     Take the ibuprofen as prescribed.  Rest and elevate your knee.  Apply ice packs 2-3 times a day for up to 20 minutes each.  Wear an ace wrap as needed  for comfort.    Follow up with the orthopedist listed below if you symptoms continue or worsen;  Or if you develop new symptoms, such as numbness, tingling, or weakness.         ED Prescriptions    Medication Sig Dispense Auth. Provider   ibuprofen (ADVIL) 800 MG tablet Take 1 tablet (800 mg total) by mouth every 8 (eight) hours as needed. 21 tablet Sharion Balloon, NP     I have reviewed the PDMP during this encounter.   Sharion Balloon, NP 03/24/19 2106

## 2019-03-24 NOTE — Telephone Encounter (Signed)
Pls cal pt, she has questions about medications and how to take them.

## 2019-03-24 NOTE — Telephone Encounter (Signed)
Questions about repeat testing discussed with [place]. Patient aware she should stop PPI for 2 weeks before repeating the H.Pylori breath test, after repeating antibiotic treatment.

## 2019-03-24 NOTE — ED Triage Notes (Signed)
Pt c/o R knee pain since [place], no injury. Pt has been using home brace without relief.

## 2019-03-24 NOTE — Discharge Instructions (Signed)
Take the ibuprofen as prescribed.  Rest and elevate your knee.  Apply ice packs 2-3 times a day for up to 20 minutes each.  Wear an ace wrap as needed for comfort.    Follow up with the orthopedist listed below if you symptoms continue or worsen;  Or if you develop new symptoms, such as numbness, tingling, or weakness.

## 2019-03-29 ENCOUNTER — Other Ambulatory Visit: Payer: Self-pay | Admitting: Gastroenterology

## 2019-04-03 ENCOUNTER — Encounter: Payer: Self-pay | Admitting: Gastroenterology

## 2019-04-08 NOTE — Progress Notes (Signed)
[id number] Kathryn Stevenson, [place] [id number] [place] [id number] Phone: [phone] Fax: [phone]      Your procedure is scheduled on October 27  Report to [facility] Main Entrance "A" at 0700 A.M., and check in at the Admitting office.  Call this number if you have problems the morning of surgery:  [phone]  Call [phone] if you have any questions prior to your surgery date Monday-[place] 8am-4pm    Remember:  Do not eat  after midnight the night before your surgery  You may drink clear liquids until 0600 am the morning of your surgery.   Clear liquids allowed are: Water, Non-Citrus Juices (without pulp), Carbonated Beverages, Clear Tea, Black Coffee Only, and Gatorade    Take these medicines the morning of surgery with A SIP OF WATER  pantoprazole (PROTONIX)  7 days prior to surgery STOP taking any Aspirin (unless otherwise instructed by your surgeon), Aleve, Naproxen, Ibuprofen, Motrin, Advil, Goody's, BC's, all herbal medications, fish oil, and all vitamins.  FOLLOW 1 DAY BOWL PREP INSTRUCTIONS     The Morning of Surgery  Do not wear jewelry, make-up or nail polish.  Do not wear lotions, powders, or perfumes/colognes, or deodorant  Do not shave 48 hours prior to surgery.  Men may shave face and neck.  Do not bring valuables to the hospital.  [facility] is not responsible for any belongings or valuables.  If you are a smoker, DO NOT Smoke 24 hours prior to surgery IF you wear a CPAP at night please bring your mask, tubing, and machine the morning of surgery   Remember that you must have someone to transport you home after your surgery, and remain with you for 24 hours if you are discharged the same day.   Contacts, glasses, hearing aids, dentures or bridgework may not be worn into surgery.    Leave your suitcase in the car.  After surgery it may be brought to your room.  For  patients admitted to the hospital, discharge time will be determined by your treatment team.  Patients discharged the day of surgery will not be allowed to drive home.    Special instructions:   [facility]- Preparing For Surgery  Before surgery, you can play an important role. Because skin is not sterile, your skin needs to be as free of germs as possible. You can reduce the number of germs on your skin by washing with CHG (chlorahexidine gluconate) Soap before surgery.  CHG is an antiseptic cleaner which kills germs and bonds with the skin to continue killing germs even after washing.    Oral Hygiene is also important to reduce your risk of infection.  Remember - BRUSH YOUR TEETH THE MORNING OF SURGERY WITH YOUR REGULAR TOOTHPASTE  Please do not use if you have an allergy to CHG or antibacterial soaps. If your skin becomes reddened/irritated stop using the CHG.  Do not shave (including legs and underarms) for at least 48 hours prior to first CHG shower. It is OK to shave your face.  Please follow these instructions carefully.   1. Shower the NIGHT BEFORE SURGERY and the MORNING OF SURGERY with CHG Soap.   2. If you chose to wash your hair, wash your hair first as usual with your normal shampoo.  3. After you shampoo, rinse your hair and body thoroughly to remove the shampoo.  4. Use CHG as you would any  other liquid soap. You can apply CHG directly to the skin and wash gently with a scrungie or a clean washcloth.   5. Apply the CHG Soap to your body ONLY FROM THE NECK DOWN.  Do not use on open wounds or open sores. Avoid contact with your eyes, ears, mouth and genitals (private parts). Wash Face and genitals (private parts)  with your normal soap.   6. Wash thoroughly, paying special attention to the area where your surgery will be performed.  7. Thoroughly rinse your body with warm water from the neck down.  8. DO NOT shower/wash with your normal soap after using and rinsing off the  CHG Soap.  9. Pat yourself dry with a CLEAN TOWEL.  10. Wear CLEAN PAJAMAS to bed the night before surgery, wear comfortable clothes the morning of surgery  11. Place CLEAN SHEETS on your bed the night of your first shower and DO NOT SLEEP WITH PETS.    Day of Surgery:  Do not apply any deodorants/lotions. Please shower the morning of surgery with the CHG soap  Please wear clean clothes to the hospital/surgery center.   Remember to brush your teeth WITH YOUR REGULAR TOOTHPASTE.   Please read over the following fact sheets that you were given.

## 2019-04-09 ENCOUNTER — Other Ambulatory Visit: Payer: Self-pay

## 2019-04-09 ENCOUNTER — Encounter (HOSPITAL_COMMUNITY)
Admission: RE | Admit: 2019-04-09 | Discharge: 2019-04-09 | Disposition: A | Discharge status: Home / Self Care | Payer: 59 | Source: Ambulatory Visit | Attending: General Surgery | Admitting: General Surgery

## 2019-04-09 ENCOUNTER — Telehealth: Payer: Self-pay | Admitting: Gastroenterology

## 2019-04-09 ENCOUNTER — Encounter (HOSPITAL_COMMUNITY): Payer: Self-pay

## 2019-04-09 DIAGNOSIS — Z01818 Encounter for other preprocedural examination: Secondary | ICD-10-CM | POA: Insufficient documentation

## 2019-04-09 LAB — CBC WITH DIFFERENTIAL/PLATELET
Abs Immature Granulocytes: 0.02 10*3/uL (ref 0.00–0.07)
Basophils Absolute: 0 10*3/uL (ref 0.0–0.1)
Basophils Relative: 0 %
Eosinophils Absolute: 0.1 10*3/uL (ref 0.0–0.5)
Eosinophils Relative: 1 %
HCT: 34.7 % — ABNORMAL LOW (ref 36.0–46.0)
Hemoglobin: 12.1 g/dL (ref 12.0–15.0)
Immature Granulocytes: 0 %
Lymphocytes Relative: 27 %
Lymphs Abs: 2 10*3/uL (ref 0.7–4.0)
MCH: 29.4 pg (ref 26.0–34.0)
MCHC: 34.9 g/dL (ref 30.0–36.0)
MCV: 84.2 fL (ref 80.0–100.0)
Monocytes Absolute: 0.5 10*3/uL (ref 0.1–1.0)
Monocytes Relative: 6 %
Neutro Abs: 4.8 10*3/uL (ref 1.7–7.7)
Neutrophils Relative %: 66 %
Platelets: 279 10*3/uL (ref 150–400)
RBC: 4.12 MIL/uL (ref 3.87–5.11)
RDW: 15.7 % — ABNORMAL HIGH (ref 11.5–15.5)
WBC: 7.4 10*3/uL (ref 4.0–10.5)
nRBC: 0 % (ref 0.0–0.2)

## 2019-04-09 LAB — COMPREHENSIVE METABOLIC PANEL
ALT: 14 U/L (ref 0–44)
AST: 21 U/L (ref 15–41)
Albumin: 3.1 g/dL — ABNORMAL LOW (ref 3.5–5.0)
Alkaline Phosphatase: 69 U/L (ref 38–126)
Anion gap: 8 (ref 5–15)
BUN: 8 mg/dL (ref 6–20)
CO2: 22 mmol/L (ref 22–32)
Calcium: 8.4 mg/dL — ABNORMAL LOW (ref 8.9–10.3)
Chloride: 108 mmol/L (ref 98–111)
Creatinine, Ser: 0.89 mg/dL (ref 0.44–1.00)
GFR calc Af Amer: >60 mL/min (ref >60)
GFR calc non Af Amer: >60 mL/min (ref >60)
Glucose, Bld: 96 mg/dL (ref 70–99)
Potassium: 3.9 mmol/L (ref 3.5–5.1)
Sodium: 138 mmol/L (ref 135–145)
Total Bilirubin: 1.1 mg/dL (ref 0.3–1.2)
Total Protein: 6.8 g/dL (ref 6.5–8.1)

## 2019-04-09 LAB — HEMOGLOBIN A1C
Hgb A1c MFr Bld: 5.4 % (ref 4.8–5.6)
Mean Plasma Glucose: 108.28 mg/dL

## 2019-04-09 LAB — PREPARE RBC (CROSSMATCH)

## 2019-04-09 LAB — PROTIME-INR
INR: 1 (ref 0.8–1.2)
Prothrombin Time: 12.9 seconds (ref 11.4–15.2)

## 2019-04-09 LAB — ABO/RH: ABO/RH(D): B POS

## 2019-04-09 NOTE — Telephone Encounter (Signed)
Please let her know urea breath test confirmed that the H. Pylori stomach bacteria cleared with treatment. If she has still been taking pantoprazole, she can stop.  Best wishes for upcoming surgery.

## 2019-04-09 NOTE — Progress Notes (Addendum)
PCP - [name] FNP @ [facility] [address] Cardiologist - na  PPM/ICD - na Device Orders -  Rep Notified -   Chest x-ray - 07-13-18 EKG - 07-13-18 Stress Test - na ECHO - na Cardiac Cath - na  Sleep Study - na CPAP -   Fasting Blood Sugar - na Checks Blood Sugar _____ times a day  Blood Thinner Instructions: Aspirin Instructions:na  ERAS Protcol -yes PRE-SURGERY Ensure or G2- na  COVID TEST- 10-23   Anesthesia review:   Patient denies shortness of breath, fever, cough and chest pain at PAT appointment   All instructions explained to the patient, with a verbal understanding of the material. Patient agrees to go over the instructions while at home for a better understanding. Patient also instructed to self quarantine after being tested for COVID-19. The opportunity to ask questions was provided.  Spoke to [name] @ Dr. Marlowe Aschoff office concerning bowel prep. Pt. Thinks she got instructions and has them at home. The pre printed one we had states antibiotics to take and mentions drinking ensure. [name] stated pt. Is not to take antibiotics. Pt. Had to leave to get to work. [name] stated she will get in touch with pt. To clarify bowel prep and if Dr. Barry Dienes wants pt. To have ensure, if so then pt. Will have to come back to pick it up.

## 2019-04-09 NOTE — Telephone Encounter (Signed)
Spoke to the patient who was very excited to hear the news that the H. Pylori bacteria has been eradicated. All recommendations reported to the patient. Patient verbalized understanding. No other complaints or concerns voiced by the conclusion of the call.

## 2019-04-11 ENCOUNTER — Other Ambulatory Visit (HOSPITAL_COMMUNITY)
Admission: RE | Admit: 2019-04-11 | Discharge: 2019-04-11 | Disposition: A | Discharge status: Home / Self Care | Payer: 59 | Source: Ambulatory Visit | Attending: General Surgery | Admitting: General Surgery

## 2019-04-11 DIAGNOSIS — Z20828 Contact with and (suspected) exposure to other viral communicable diseases: Secondary | ICD-10-CM | POA: Diagnosis not present

## 2019-04-11 DIAGNOSIS — Z01812 Encounter for preprocedural laboratory examination: Secondary | ICD-10-CM | POA: Insufficient documentation

## 2019-04-11 NOTE — Progress Notes (Signed)
.  Please complete your PRE-SURGERY ENSURE that was provided to you by 0600 am the morning of surgery.  Please, if able, drink it in one setting. DO NOT SIP.

## 2019-04-12 LAB — NOVEL CORONAVIRUS, NAA (HOSP ORDER, SEND-OUT TO REF LAB; TAT 18-24 HRS): SARS-CoV-2, NAA: NOT DETECTED

## 2019-04-14 MED ORDER — DEXTROSE 5 % IV SOLN
3.0000 g | INTRAVENOUS | Status: AC
  Administered 2019-04-15 (×2): 3 g via INTRAVENOUS
  Filled 2019-04-14: qty 3000
  Filled 2019-04-14: qty 3

## 2019-04-15 ENCOUNTER — Encounter (HOSPITAL_COMMUNITY): Payer: Self-pay

## 2019-04-15 ENCOUNTER — Encounter (HOSPITAL_COMMUNITY)
Admission: RE | Disposition: A | Discharge status: Home Health | Payer: Self-pay | Source: Home / Self Care | Attending: General Surgery

## 2019-04-15 ENCOUNTER — Inpatient Hospital Stay (HOSPITAL_COMMUNITY)
Admission: RE | Admit: 2019-04-15 | Discharge: 2019-04-23 | DRG: 406 | Disposition: A | Discharge status: Home Health | Payer: 59 | Attending: General Surgery | Admitting: General Surgery

## 2019-04-15 ENCOUNTER — Inpatient Hospital Stay (HOSPITAL_COMMUNITY): Payer: 59

## 2019-04-15 ENCOUNTER — Inpatient Hospital Stay (HOSPITAL_COMMUNITY): Payer: 59 | Admitting: Certified Registered"

## 2019-04-15 ENCOUNTER — Other Ambulatory Visit: Payer: Self-pay

## 2019-04-15 DIAGNOSIS — I471 Supraventricular tachycardia: Secondary | ICD-10-CM | POA: Diagnosis not present

## 2019-04-15 DIAGNOSIS — D1803 Hemangioma of intra-abdominal structures: Secondary | ICD-10-CM | POA: Diagnosis present

## 2019-04-15 DIAGNOSIS — D72829 Elevated white blood cell count, unspecified: Secondary | ICD-10-CM | POA: Diagnosis not present

## 2019-04-15 DIAGNOSIS — Z20828 Contact with and (suspected) exposure to other viral communicable diseases: Secondary | ICD-10-CM | POA: Diagnosis present

## 2019-04-15 DIAGNOSIS — C250 Malignant neoplasm of head of pancreas: Principal | ICD-10-CM | POA: Diagnosis present

## 2019-04-15 DIAGNOSIS — K2950 Unspecified chronic gastritis without bleeding: Secondary | ICD-10-CM | POA: Diagnosis present

## 2019-04-15 DIAGNOSIS — Z8249 Family history of ischemic heart disease and other diseases of the circulatory system: Secondary | ICD-10-CM

## 2019-04-15 DIAGNOSIS — R509 Fever, unspecified: Secondary | ICD-10-CM

## 2019-04-15 DIAGNOSIS — Z8049 Family history of malignant neoplasm of other genital organs: Secondary | ICD-10-CM

## 2019-04-15 DIAGNOSIS — B9681 Helicobacter pylori [H. pylori] as the cause of diseases classified elsewhere: Secondary | ICD-10-CM | POA: Diagnosis present

## 2019-04-15 DIAGNOSIS — K219 Gastro-esophageal reflux disease without esophagitis: Secondary | ICD-10-CM | POA: Diagnosis present

## 2019-04-15 DIAGNOSIS — I10 Essential (primary) hypertension: Secondary | ICD-10-CM | POA: Diagnosis present

## 2019-04-15 DIAGNOSIS — Z6841 Body Mass Index (BMI) 40.0 and over, adult: Secondary | ICD-10-CM

## 2019-04-15 DIAGNOSIS — D649 Anemia, unspecified: Secondary | ICD-10-CM | POA: Diagnosis not present

## 2019-04-15 DIAGNOSIS — Z452 Encounter for adjustment and management of vascular access device: Secondary | ICD-10-CM

## 2019-04-15 DIAGNOSIS — C259 Malignant neoplasm of pancreas, unspecified: Secondary | ICD-10-CM | POA: Diagnosis present

## 2019-04-15 HISTORY — PX: WHIPPLE PROCEDURE: SHX2667

## 2019-04-15 HISTORY — DX: Malignant neoplasm of head of pancreas: C25.0

## 2019-04-15 LAB — POCT PREGNANCY, URINE: Preg Test, Ur: NEGATIVE

## 2019-04-15 LAB — MRSA PCR SCREENING: MRSA by PCR: POSITIVE — AB

## 2019-04-15 LAB — POCT I-STAT 7, (LYTES, BLD GAS, ICA,H+H)
Acid-base deficit: 3 mmol/L — ABNORMAL HIGH (ref 0.0–2.0)
Acid-base deficit: 3 mmol/L — ABNORMAL HIGH (ref 0.0–2.0)
Acid-base deficit: 3 mmol/L — ABNORMAL HIGH (ref 0.0–2.0)
Bicarbonate: 23.7 mmol/L (ref 20.0–28.0)
Bicarbonate: 22.3 mmol/L (ref 20.0–28.0)
Bicarbonate: 23.5 mmol/L (ref 20.0–28.0)
Calcium, Ion: 1.14 mmol/L — ABNORMAL LOW (ref 1.15–1.40)
Calcium, Ion: 1.13 mmol/L — ABNORMAL LOW (ref 1.15–1.40)
Calcium, Ion: 1.13 mmol/L — ABNORMAL LOW (ref 1.15–1.40)
HCT: 32 % — ABNORMAL LOW (ref 36.0–46.0)
HCT: 30 % — ABNORMAL LOW (ref 36.0–46.0)
HCT: 30 % — ABNORMAL LOW (ref 36.0–46.0)
Hemoglobin: 10.9 g/dL — ABNORMAL LOW (ref 12.0–15.0)
Hemoglobin: 10.2 g/dL — ABNORMAL LOW (ref 12.0–15.0)
Hemoglobin: 10.2 g/dL — ABNORMAL LOW (ref 12.0–15.0)
O2 Saturation: 96 %
O2 Saturation: 98 %
O2 Saturation: 99 %
Patient temperature: 35.9
Patient temperature: 36.1
Potassium: 3.6 mmol/L (ref 3.5–5.1)
Potassium: 3.8 mmol/L (ref 3.5–5.1)
Potassium: 4 mmol/L (ref 3.5–5.1)
Sodium: 138 mmol/L (ref 135–145)
Sodium: 137 mmol/L (ref 135–145)
Sodium: 138 mmol/L (ref 135–145)
TCO2: 25 mmol/L (ref 22–32)
TCO2: 24 mmol/L (ref 22–32)
TCO2: 25 mmol/L (ref 22–32)
pCO2 arterial: 49.1 mmHg — ABNORMAL HIGH (ref 32.0–48.0)
pCO2 arterial: 39.8 mmHg (ref 32.0–48.0)
pCO2 arterial: 44.7 mmHg (ref 32.0–48.0)
pH, Arterial: 7.291 — ABNORMAL LOW (ref 7.350–7.450)
pH, Arterial: 7.324 — ABNORMAL LOW (ref 7.350–7.450)
pH, Arterial: 7.352 (ref 7.350–7.450)
pO2, Arterial: 96 mmHg (ref 83.0–108.0)
pO2, Arterial: 116 mmHg — ABNORMAL HIGH (ref 83.0–108.0)
pO2, Arterial: 131 mmHg — ABNORMAL HIGH (ref 83.0–108.0)

## 2019-04-15 LAB — POCT I-STAT, CHEM 8
BUN: 6 mg/dL (ref 6–20)
Calcium, Ion: 1.15 mmol/L (ref 1.15–1.40)
Chloride: 104 mmol/L (ref 98–111)
Creatinine, Ser: 0.7 mg/dL (ref 0.44–1.00)
Glucose, Bld: 137 mg/dL — ABNORMAL HIGH (ref 70–99)
HCT: 34 % — ABNORMAL LOW (ref 36.0–46.0)
Hemoglobin: 11.6 g/dL — ABNORMAL LOW (ref 12.0–15.0)
Potassium: 4 mmol/L (ref 3.5–5.1)
Sodium: 137 mmol/L (ref 135–145)
TCO2: 22 mmol/L (ref 22–32)

## 2019-04-15 SURGERY — WHIPPLE PROCEDURE
Anesthesia: General | Site: Abdomen

## 2019-04-15 MED ORDER — DEXAMETHASONE SODIUM PHOSPHATE 10 MG/ML IJ SOLN
INTRAMUSCULAR | Status: DC | PRN
  Administered 2019-04-15: 10 mg via INTRAVENOUS

## 2019-04-15 MED ORDER — ACETAMINOPHEN 500 MG PO TABS
ORAL_TABLET | ORAL | Status: AC
  Administered 2019-04-15: 1000 mg via ORAL
  Filled 2019-04-15: qty 2

## 2019-04-15 MED ORDER — ONDANSETRON HCL 4 MG/2ML IJ SOLN
INTRAMUSCULAR | Status: DC | PRN
  Administered 2019-04-15: 4 mg via INTRAVENOUS

## 2019-04-15 MED ORDER — MIDAZOLAM HCL 2 MG/2ML IJ SOLN
INTRAMUSCULAR | Status: AC
  Filled 2019-04-15: qty 2

## 2019-04-15 MED ORDER — ONDANSETRON HCL 4 MG/2ML IJ SOLN
INTRAMUSCULAR | Status: AC
  Filled 2019-04-15: qty 2

## 2019-04-15 MED ORDER — ONDANSETRON HCL 4 MG/2ML IJ SOLN
4.0000 mg | Freq: Once | INTRAMUSCULAR | Status: AC | PRN
  Administered 2019-04-15: 4 mg via INTRAVENOUS

## 2019-04-15 MED ORDER — DIPHENHYDRAMINE HCL 50 MG/ML IJ SOLN
12.5000 mg | Freq: Four times a day (QID) | INTRAMUSCULAR | Status: DC | PRN
  Administered 2019-04-20: 12.5 mg via INTRAVENOUS
  Filled 2019-04-15: qty 1
  Filled 2019-04-15: qty 0.25

## 2019-04-15 MED ORDER — ACETAMINOPHEN 10 MG/ML IV SOLN
1000.0000 mg | Freq: Four times a day (QID) | INTRAVENOUS | Status: AC
  Administered 2019-04-15 – 2019-04-16 (×4): 1000 mg via INTRAVENOUS
  Filled 2019-04-15 (×3): qty 100

## 2019-04-15 MED ORDER — HYDROMORPHONE 1 MG/ML IV SOLN
INTRAVENOUS | Status: DC
  Administered 2019-04-15: 30 mg via INTRAVENOUS
  Administered 2019-04-15: 1.3 mg via INTRAVENOUS
  Administered 2019-04-16: 0.6 mg via INTRAVENOUS
  Administered 2019-04-16: 0.2 mg via INTRAVENOUS
  Administered 2019-04-16: 1.2 mg via INTRAVENOUS
  Administered 2019-04-16: 1.3 mg via INTRAVENOUS
  Administered 2019-04-17: 0 mg via INTRAVENOUS
  Administered 2019-04-17: 0.8 mg via INTRAVENOUS
  Administered 2019-04-17 (×2): 1 mg via INTRAVENOUS
  Administered 2019-04-17: 0.8 mg via INTRAVENOUS
  Administered 2019-04-17: 0.2 mg via INTRAVENOUS
  Administered 2019-04-17: 0.8 mg via INTRAVENOUS
  Administered 2019-04-18: 0.6 mg via INTRAVENOUS
  Administered 2019-04-18 (×4): 0.4 mg via INTRAVENOUS
  Administered 2019-04-19: 0.8 mg via INTRAVENOUS
  Administered 2019-04-19: 0.4 mg via INTRAVENOUS
  Administered 2019-04-19: 0.02 mg via INTRAVENOUS
  Administered 2019-04-19: 0.4 mg via INTRAVENOUS
  Administered 2019-04-19: 0.6 mg via INTRAVENOUS
  Administered 2019-04-20: 0.4 mg via INTRAVENOUS
  Administered 2019-04-20: 0.6 mg via INTRAVENOUS
  Administered 2019-04-20: 0 mg via INTRAVENOUS
  Administered 2019-04-21: 0.2 mg via INTRAVENOUS
  Administered 2019-04-21 (×2): 0.4 mg via INTRAVENOUS
  Administered 2019-04-21: 0 via INTRAVENOUS
  Filled 2019-04-15: qty 30

## 2019-04-15 MED ORDER — LIDOCAINE 2% (20 MG/ML) 5 ML SYRINGE
INTRAMUSCULAR | Status: DC | PRN
  Administered 2019-04-15: 40 mg via INTRAVENOUS

## 2019-04-15 MED ORDER — ACETAMINOPHEN 500 MG PO TABS
1000.0000 mg | ORAL_TABLET | ORAL | Status: AC
  Administered 2019-04-15: 08:00:00 1000 mg via ORAL

## 2019-04-15 MED ORDER — ACETAMINOPHEN 10 MG/ML IV SOLN
INTRAVENOUS | Status: AC
  Filled 2019-04-15: qty 100

## 2019-04-15 MED ORDER — FENTANYL CITRATE (PF) 100 MCG/2ML IJ SOLN
INTRAMUSCULAR | Status: DC | PRN
  Administered 2019-04-15: 50 ug via INTRAVENOUS
  Administered 2019-04-15: 25 ug via INTRAVENOUS
  Administered 2019-04-15: 50 ug via INTRAVENOUS

## 2019-04-15 MED ORDER — FENTANYL CITRATE (PF) 100 MCG/2ML IJ SOLN: 25.0000 ug | INTRAMUSCULAR | Status: DC | PRN

## 2019-04-15 MED ORDER — CHLORHEXIDINE GLUCONATE CLOTH 2 % EX PADS
6.0000 | MEDICATED_PAD | Freq: Every day | CUTANEOUS | Status: DC
  Administered 2019-04-15 – 2019-04-23 (×9): 6 via TOPICAL

## 2019-04-15 MED ORDER — SUGAMMADEX SODIUM 200 MG/2ML IV SOLN
INTRAVENOUS | Status: DC | PRN
  Administered 2019-04-15: 400 mg via INTRAVENOUS

## 2019-04-15 MED ORDER — DIPHENHYDRAMINE HCL 50 MG/ML IJ SOLN: 12.5000 mg | Freq: Four times a day (QID) | INTRAMUSCULAR | Status: DC | PRN

## 2019-04-15 MED ORDER — SODIUM CHLORIDE 0.9% FLUSH
10.0000 mL | Freq: Two times a day (BID) | INTRAVENOUS | Status: DC
  Administered 2019-04-15: 21:00:00 20 mL
  Administered 2019-04-16: 10 mL
  Administered 2019-04-17: 20 mL
  Administered 2019-04-17 – 2019-04-20 (×8): 10 mL

## 2019-04-15 MED ORDER — OXYCODONE HCL 5 MG PO TABS: 5.0000 mg | ORAL_TABLET | Freq: Once | ORAL | Status: DC | PRN

## 2019-04-15 MED ORDER — VISTASEAL 10 ML SINGLE DOSE KIT
10.0000 mL | PACK | Freq: Once | CUTANEOUS | Status: AC
  Administered 2019-04-15: 10 mL via TOPICAL
  Filled 2019-04-15: qty 10

## 2019-04-15 MED ORDER — PROCHLORPERAZINE MALEATE 10 MG PO TABS
10.0000 mg | ORAL_TABLET | Freq: Four times a day (QID) | ORAL | Status: DC | PRN
  Filled 2019-04-15: qty 1

## 2019-04-15 MED ORDER — NALOXONE HCL 0.4 MG/ML IJ SOLN: 0.4000 mg | INTRAMUSCULAR | Status: DC | PRN

## 2019-04-15 MED ORDER — ROPIVACAINE HCL 2 MG/ML IJ SOLN
INTRAMUSCULAR | Status: DC | PRN
  Administered 2019-04-15: 8 mL/h via EPIDURAL

## 2019-04-15 MED ORDER — SODIUM CHLORIDE 0.9 % IV SOLN
INTRAVENOUS | Status: DC | PRN
  Administered 2019-04-15: 50 ug/min via INTRAVENOUS

## 2019-04-15 MED ORDER — OXYCODONE HCL 5 MG/5ML PO SOLN: 5.0000 mg | Freq: Once | ORAL | Status: DC | PRN

## 2019-04-15 MED ORDER — MIDAZOLAM HCL 5 MG/5ML IJ SOLN
INTRAMUSCULAR | Status: DC | PRN
  Administered 2019-04-15: 2 mg via INTRAVENOUS

## 2019-04-15 MED ORDER — LACTATED RINGERS IV SOLN
INTRAVENOUS | Status: DC | PRN
  Administered 2019-04-15 (×2): via INTRAVENOUS

## 2019-04-15 MED ORDER — ROCURONIUM BROMIDE 50 MG/5ML IV SOSY
PREFILLED_SYRINGE | INTRAVENOUS | Status: DC | PRN
  Administered 2019-04-15 (×2): 20 mg via INTRAVENOUS
  Administered 2019-04-15: 70 mg via INTRAVENOUS
  Administered 2019-04-15 (×3): 20 mg via INTRAVENOUS
  Administered 2019-04-15: 30 mg via INTRAVENOUS
  Administered 2019-04-15: 20 mg via INTRAVENOUS

## 2019-04-15 MED ORDER — METHOCARBAMOL 1000 MG/10ML IJ SOLN: 500.0000 mg | Freq: Four times a day (QID) | INTRAVENOUS | Status: DC | PRN

## 2019-04-15 MED ORDER — 0.9 % SODIUM CHLORIDE (POUR BTL) OPTIME
TOPICAL | Status: DC | PRN
  Administered 2019-04-15 (×2): 1000 mL

## 2019-04-15 MED ORDER — SODIUM CHLORIDE 0.9% FLUSH: 9.0000 mL | INTRAVENOUS | Status: DC | PRN

## 2019-04-15 MED ORDER — ONDANSETRON HCL 4 MG/2ML IJ SOLN: 4.0000 mg | Freq: Once | INTRAMUSCULAR | Status: DC | PRN

## 2019-04-15 MED ORDER — DIPHENHYDRAMINE HCL 12.5 MG/5ML PO ELIX: 12.5000 mg | ORAL_SOLUTION | Freq: Four times a day (QID) | ORAL | Status: DC | PRN

## 2019-04-15 MED ORDER — ONDANSETRON HCL 4 MG/2ML IJ SOLN
4.0000 mg | Freq: Four times a day (QID) | INTRAMUSCULAR | Status: DC | PRN
  Administered 2019-04-15 – 2019-04-16 (×3): 4 mg via INTRAVENOUS
  Filled 2019-04-15 (×2): qty 2

## 2019-04-15 MED ORDER — PANTOPRAZOLE SODIUM 40 MG IV SOLR
40.0000 mg | Freq: Every day | INTRAVENOUS | Status: DC
  Administered 2019-04-15 – 2019-04-21 (×7): 40 mg via INTRAVENOUS
  Filled 2019-04-15 (×7): qty 40

## 2019-04-15 MED ORDER — SODIUM CHLORIDE 0.9% FLUSH: 10.0000 mL | INTRAVENOUS | Status: DC | PRN

## 2019-04-15 MED ORDER — CHLORHEXIDINE GLUCONATE CLOTH 2 % EX PADS: 6.0000 | MEDICATED_PAD | Freq: Once | CUTANEOUS | Status: DC

## 2019-04-15 MED ORDER — FENTANYL CITRATE (PF) 250 MCG/5ML IJ SOLN
INTRAMUSCULAR | Status: AC
  Filled 2019-04-15: qty 5

## 2019-04-15 MED ORDER — PROPOFOL 10 MG/ML IV BOLUS
INTRAVENOUS | Status: AC
  Filled 2019-04-15: qty 40

## 2019-04-15 MED ORDER — KCL IN DEXTROSE-NACL 20-5-0.45 MEQ/L-%-% IV SOLN
INTRAVENOUS | Status: DC
  Administered 2019-04-15 – 2019-04-22 (×12): via INTRAVENOUS
  Filled 2019-04-15 (×14): qty 1000

## 2019-04-15 MED ORDER — GABAPENTIN 300 MG PO CAPS
300.0000 mg | ORAL_CAPSULE | ORAL | Status: AC
  Administered 2019-04-15: 08:00:00 300 mg via ORAL

## 2019-04-15 MED ORDER — GABAPENTIN 300 MG PO CAPS
ORAL_CAPSULE | ORAL | Status: AC
  Administered 2019-04-15: 300 mg via ORAL
  Filled 2019-04-15: qty 1

## 2019-04-15 MED ORDER — CHLORHEXIDINE GLUCONATE CLOTH 2 % EX PADS
6.0000 | MEDICATED_PAD | Freq: Once | CUTANEOUS | Status: DC
  Administered 2019-04-15: 6 via TOPICAL

## 2019-04-15 MED ORDER — PROCHLORPERAZINE EDISYLATE 10 MG/2ML IJ SOLN
5.0000 mg | Freq: Four times a day (QID) | INTRAMUSCULAR | Status: DC | PRN
  Administered 2019-04-16 – 2019-04-18 (×4): 10 mg via INTRAVENOUS
  Filled 2019-04-15 (×6): qty 2

## 2019-04-15 MED ORDER — WATER FOR IRRIGATION, STERILE IR SOLN
Status: DC | PRN
  Administered 2019-04-15: 1000 mL

## 2019-04-15 MED ORDER — ALBUMIN HUMAN 5 % IV SOLN
INTRAVENOUS | Status: DC | PRN
  Administered 2019-04-15 (×3): via INTRAVENOUS

## 2019-04-15 MED ORDER — DIPHENHYDRAMINE HCL 12.5 MG/5ML PO ELIX
12.5000 mg | ORAL_SOLUTION | Freq: Four times a day (QID) | ORAL | Status: DC | PRN
  Administered 2019-04-20: 12.5 mg via ORAL
  Filled 2019-04-15 (×3): qty 5

## 2019-04-15 MED ORDER — LACTATED RINGERS IV SOLN
INTRAVENOUS | Status: DC
  Administered 2019-04-15: 08:00:00 via INTRAVENOUS

## 2019-04-15 MED ORDER — CEFAZOLIN SODIUM-DEXTROSE 2-4 GM/100ML-% IV SOLN
2.0000 g | Freq: Three times a day (TID) | INTRAVENOUS | Status: AC
  Administered 2019-04-15: 2 g via INTRAVENOUS
  Filled 2019-04-15: qty 100

## 2019-04-15 MED ORDER — ROPIVACAINE HCL 2 MG/ML IJ SOLN
8.0000 mL | INTRAMUSCULAR | Status: DC
  Administered 2019-04-15 – 2019-04-16 (×2): 8 mL via EPIDURAL
  Filled 2019-04-15 (×2): qty 200

## 2019-04-15 MED ORDER — PROPOFOL 10 MG/ML IV BOLUS
INTRAVENOUS | Status: DC | PRN
  Administered 2019-04-15: 200 mg via INTRAVENOUS

## 2019-04-15 SURGICAL SUPPLY — 105 items
APL PRP STRL LF DISP 70% ISPRP (MISCELLANEOUS) ×1
BAG BILE T-TUBES STRL (MISCELLANEOUS) ×4 IMPLANT
BAG DRN 9.5 2 ADJ BELT ADPR (MISCELLANEOUS) ×2
BIOPATCH RED 1 DISK 7.0 (GAUZE/BANDAGES/DRESSINGS) ×2 IMPLANT
BLADE SURG 10 STRL SS (BLADE) ×2 IMPLANT
BOOT SUTURE AID YELLOW STND (SUTURE) ×3 IMPLANT
BRR ADH 5X3 SEPRAFILM 6 SHT (MISCELLANEOUS)
CANISTER SUCT 3000ML PPV (MISCELLANEOUS) ×2 IMPLANT
CHLORAPREP W/TINT 26 (MISCELLANEOUS) ×2 IMPLANT
CLIP VESOCCLUDE LG 6/CT (CLIP) ×2 IMPLANT
CLIP VESOCCLUDE MED 24/CT (CLIP) ×2 IMPLANT
CLIP VESOLOCK LG 6/CT PURPLE (CLIP) ×5 IMPLANT
CLIP VESOLOCK MED 6/CT (CLIP) ×2 IMPLANT
CLIP VESOLOCK MED LG 6/CT (CLIP) ×3 IMPLANT
CONT SPEC 4OZ CLIKSEAL STRL BL (MISCELLANEOUS) IMPLANT
COVER MAYO STAND STRL (DRAPES) ×3 IMPLANT
COVER SURGICAL LIGHT HANDLE (MISCELLANEOUS) ×2 IMPLANT
COVER WAND RF STERILE (DRAPES) ×2 IMPLANT
DRAIN CHANNEL 19F RND (DRAIN) ×4 IMPLANT
DRAIN PENROSE 18X1/2 LTX STRL (DRAIN) ×2 IMPLANT
DRAPE LAPAROSCOPIC ABDOMINAL (DRAPES) ×1 IMPLANT
DRAPE UTILITY XL STRL (DRAPES) ×1 IMPLANT
DRAPE WARM FLUID 44X44 (DRAPES) ×2 IMPLANT
DRESSING PREVENA PLUS CUSTOM (GAUZE/BANDAGES/DRESSINGS) IMPLANT
DRSG COVADERM 4X10 (GAUZE/BANDAGES/DRESSINGS) IMPLANT
DRSG COVADERM 4X14 (GAUZE/BANDAGES/DRESSINGS) IMPLANT
DRSG COVADERM 4X6 (GAUZE/BANDAGES/DRESSINGS) IMPLANT
DRSG COVADERM 4X8 (GAUZE/BANDAGES/DRESSINGS) IMPLANT
DRSG PREVENA PLUS CUSTOM (GAUZE/BANDAGES/DRESSINGS) ×2
DRSG TELFA 3X8 NADH (GAUZE/BANDAGES/DRESSINGS) IMPLANT
ELECT BLADE 4.0 EZ CLEAN MEGAD (MISCELLANEOUS) ×2
ELECT BLADE 6.5 EXT (BLADE) ×2 IMPLANT
ELECT CAUTERY BLADE 6.4 (BLADE) ×2 IMPLANT
ELECT REM PT RETURN 9FT ADLT (ELECTROSURGICAL) ×2
ELECTRODE BLDE 4.0 EZ CLN MEGD (MISCELLANEOUS) IMPLANT
ELECTRODE REM PT RTRN 9FT ADLT (ELECTROSURGICAL) ×1 IMPLANT
GAUZE 4X4 16PLY RFD (DISPOSABLE) IMPLANT
GAUZE SPONGE 4X4 12PLY STRL (GAUZE/BANDAGES/DRESSINGS) IMPLANT
GEL ULTRASOUND 20GR AQUASONIC (MISCELLANEOUS) ×2 IMPLANT
GLOVE BIO SURGEON STRL SZ 6 (GLOVE) ×4 IMPLANT
GLOVE INDICATOR 6.5 STRL GRN (GLOVE) ×4 IMPLANT
GOWN STRL REUS W/ TWL LRG LVL3 (GOWN DISPOSABLE) ×2 IMPLANT
GOWN STRL REUS W/TWL 2XL LVL3 (GOWN DISPOSABLE) ×4 IMPLANT
GOWN STRL REUS W/TWL LRG LVL3 (GOWN DISPOSABLE) ×4
HANDLE SUCTION POOLE (INSTRUMENTS) ×1 IMPLANT
HEMOSTAT SURGICEL 2X14 (HEMOSTASIS) IMPLANT
KIT BASIN OR (CUSTOM PROCEDURE TRAY) ×2 IMPLANT
KIT MARKER MARGIN INK (KITS) ×2 IMPLANT
KIT TUBE JEJUNAL 16FR (CATHETERS) ×2 IMPLANT
KIT TURNOVER KIT B (KITS) ×2 IMPLANT
LOOP VESSEL MAXI BLUE (MISCELLANEOUS) ×2 IMPLANT
LOOP VESSEL MINI RED (MISCELLANEOUS) ×2 IMPLANT
NEEDLE 22X1 1/2 (OR ONLY) (NEEDLE) ×2 IMPLANT
NS IRRIG 1000ML POUR BTL (IV SOLUTION) ×4 IMPLANT
PACK GENERAL/GYN (CUSTOM PROCEDURE TRAY) ×2 IMPLANT
PAD ARMBOARD 7.5X6 YLW CONV (MISCELLANEOUS) ×4 IMPLANT
PAD DRESSING TELFA 3X8 NADH (GAUZE/BANDAGES/DRESSINGS) IMPLANT
PENCIL SMOKE EVACUATOR (MISCELLANEOUS) ×2 IMPLANT
PLUG CATH AND CAP STER (CATHETERS) IMPLANT
RELOAD PROXIMATE 75MM BLUE (ENDOMECHANICALS) ×6 IMPLANT
RELOAD PROXIMATE 75MM GREEN (ENDOMECHANICALS) IMPLANT
RELOAD STAPLE 75 3.8 BLU REG (ENDOMECHANICALS) IMPLANT
RELOAD STAPLE 75 4.5 GRN THCK (ENDOMECHANICALS) IMPLANT
SEPRAFILM PROCEDURAL PACK 3X5 (MISCELLANEOUS) IMPLANT
SHEARS FOC LG CVD HARMONIC 17C (MISCELLANEOUS) ×2 IMPLANT
SLEEVE SUCTION 125 (MISCELLANEOUS) ×2 IMPLANT
SLEEVE SUCTION CATH 165 (SLEEVE) ×2 IMPLANT
SPONGE INTESTINAL PEANUT (DISPOSABLE) IMPLANT
SPONGE LAP 18X18 RF (DISPOSABLE) ×6 IMPLANT
SPONGE SURGIFOAM ABS GEL 100 (HEMOSTASIS) IMPLANT
STAPLER PROXIMATE 75MM BLUE (STAPLE) ×2 IMPLANT
STAPLER VISISTAT 35W (STAPLE) ×2 IMPLANT
SUCTION POOLE HANDLE (INSTRUMENTS) ×2
SUT 5.0 PDS RB-1 (SUTURE) ×5
SUT ETHILON 2 0 FS 18 (SUTURE) ×4 IMPLANT
SUT ETHILON 2 LR (SUTURE) IMPLANT
SUT PDS AB 1 TP1 96 (SUTURE) ×5 IMPLANT
SUT PDS AB 3-0 SH 27 (SUTURE) ×8 IMPLANT
SUT PDS AB 4-0 RB1 27 (SUTURE) ×23 IMPLANT
SUT PDS PLUS AB 5-0 RB-1 (SUTURE) ×5 IMPLANT
SUT PROLENE 3 0 SH 48 (SUTURE) ×8 IMPLANT
SUT PROLENE 4 0 RB 1 (SUTURE) ×4
SUT PROLENE 4-0 RB1 .5 CRCL 36 (SUTURE) ×2 IMPLANT
SUT PROLENE 5 0 RB 1 DA (SUTURE) ×4 IMPLANT
SUT SILK 2 0 SH CR/8 (SUTURE) ×3 IMPLANT
SUT SILK 2 0 TIES 10X30 (SUTURE) ×2 IMPLANT
SUT SILK 2 0SH CR/8 30 (SUTURE) ×1 IMPLANT
SUT SILK 3 0 SH CR/8 (SUTURE) ×2 IMPLANT
SUT SILK 3 0 TIES 10X30 (SUTURE) ×2 IMPLANT
SUT VIC AB 2-0 CT1 27 (SUTURE)
SUT VIC AB 2-0 CT1 TAPERPNT 27 (SUTURE) IMPLANT
SUT VIC AB 2-0 SH 18 (SUTURE) ×2 IMPLANT
SUT VIC AB 3-0 SH 18 (SUTURE) ×2 IMPLANT
SUT VIC AB 3-0 SH 27 (SUTURE) ×2
SUT VIC AB 3-0 SH 27X BRD (SUTURE) ×1 IMPLANT
SUT VICRYL AB 2 0 TIES (SUTURE) IMPLANT
TAPE UMBILICAL 1/8 X36 TWILL (MISCELLANEOUS) IMPLANT
TOWEL GREEN STERILE (TOWEL DISPOSABLE) ×2 IMPLANT
TOWEL GREEN STERILE FF (TOWEL DISPOSABLE) ×2 IMPLANT
TRAY FOLEY MTR SLVR 14FR STAT (SET/KITS/TRAYS/PACK) ×2 IMPLANT
TUBE CONNECTING 12X1/4 (SUCTIONS) ×2 IMPLANT
TUBE FEEDING 8FR 16IN STR KANG (MISCELLANEOUS) IMPLANT
TUBE FEEDING ENTERAL 5FR 16IN (TUBING) ×1 IMPLANT
TUNNELER SHEATH ON-Q 16GX12 DP (PAIN MANAGEMENT) ×2 IMPLANT
YANKAUER SUCT BULB TIP NO VENT (SUCTIONS) ×2 IMPLANT

## 2019-04-15 NOTE — Anesthesia Preprocedure Evaluation (Addendum)
Anesthesia Evaluation  Patient identified by MRN, date of birth, ID band Patient awake    Reviewed: Allergy & Precautions, NPO status , Patient's Chart, lab work & pertinent test results  Airway Mallampati: III  TM Distance: >3 FB Neck ROM: Full    Dental  (+) Teeth Intact, Dental Advisory Given   Pulmonary former smoker,    breath sounds clear to auscultation       Cardiovascular  Rhythm:Regular Rate:Normal     Neuro/Psych    GI/Hepatic   Endo/Other    Renal/GU      Musculoskeletal   Abdominal (+) + obese,   Peds  Hematology   Anesthesia Other Findings   Reproductive/Obstetrics                            Anesthesia Physical Anesthesia Plan  ASA: III  Anesthesia Plan: General   Post-op Pain Management: GA combined w/ Regional for post-op pain   Induction: Intravenous  PONV Risk Score and Plan: Ondansetron and Dexamethasone  Airway Management Planned: Oral ETT  Additional Equipment:   Intra-op Plan:   Post-operative Plan: Extubation in OR  Informed Consent: I have reviewed the patients History and Physical, chart, labs and discussed the procedure including the risks, benefits and alternatives for the proposed anesthesia with the patient or authorized representative who has indicated his/her understanding and acceptance.     Dental advisory given  Plan Discussed with: CRNA and Anesthesiologist  Anesthesia Plan Comments:         Anesthesia Quick Evaluation

## 2019-04-15 NOTE — Anesthesia Postprocedure Evaluation (Signed)
Anesthesia Post Note  Patient: Kathryn Stevenson  Procedure(s) Performed: WHIPPLE PROCEDURE (N/A Abdomen)     Patient location during evaluation: PACU Anesthesia Type: General and Epidural Level of consciousness: awake and alert, awake and oriented Pain management: pain level controlled Vital Signs Assessment: post-procedure vital signs reviewed and stable Respiratory status: spontaneous breathing, nonlabored ventilation, respiratory function stable and patient connected to nasal cannula oxygen Cardiovascular status: blood pressure returned to baseline and stable Postop Assessment: no apparent nausea or vomiting Anesthetic complications: no    Last Vitals:  Vitals:   04/15/19 1641 04/15/19 1700  BP: 120/76   Pulse: 69   Resp: 17   Temp:  36.4 C  SpO2: 100%     Last Pain:  Vitals:   04/15/19 1641  TempSrc:   PainSc: 0-No pain                 JOSLIN,DAVID COKER

## 2019-04-15 NOTE — Anesthesia Procedure Notes (Signed)
Epidural Patient location during procedure: pre-op Start time: 04/15/2019 9:20 AM End time: 04/15/2019 9:30 AM  Staffing Anesthesiologist: Josephine Igo, MD Performed: anesthesiologist   Preanesthetic Checklist Completed: patient identified, site marked, surgical consent, pre-op evaluation, timeout performed, IV checked, risks and benefits discussed and monitors and equipment checked  Epidural Patient position: sitting Prep: site prepped and draped and DuraPrep Patient monitoring: continuous pulse ox and blood pressure Approach: midline Location: L1-L2 Injection technique: LOR air  Needle:  Needle type: Tuohy  Needle gauge: 17 G Needle length: 9 cm and 9 Needle insertion depth: 9 cm Catheter type: closed end flexible Catheter size: 19 Gauge Catheter at skin depth: 15 cm Test dose: negative  Assessment Events: blood not aspirated, injection not painful, no injection resistance, negative IV test and no paresthesia  Additional Notes Patient tolerated procedure well. Attempt x 4. Difficult due to MO. No CSF heme or paresthesias encountered during procedure. Sterile dressing with Tegaderm applied. Reason for block:at surgeon's request and post-op pain management

## 2019-04-15 NOTE — Transfer of Care (Signed)
Immediate Anesthesia Transfer of Care Note  Patient: Kathryn Stevenson  Procedure(s) Performed: WHIPPLE PROCEDURE (N/A Abdomen)  Patient Location: PACU  Anesthesia Type:GA combined with regional for post-op pain  Level of Consciousness: drowsy and patient cooperative  Airway & Oxygen Therapy: Patient Spontanous Breathing and Patient connected to face mask oxygen  Post-op Assessment: Report given to RN and Post -op Vital signs reviewed and stable  Post vital signs: Reviewed and stable  Last Vitals:  Vitals Value Taken Time  BP 93/75 04/15/19 1612  Temp    Pulse 74 04/15/19 1612  Resp 17 04/15/19 1612  SpO2 100 % 04/15/19 1612  Vitals shown include unvalidated device data.  Last Pain:  Vitals:   04/15/19 0723  TempSrc: Oral         Complications: No apparent anesthesia complications

## 2019-04-15 NOTE — Op Note (Signed)
PREOPERATIVE DIAGNOSIS: Primary malignant neoplasm of head of pancreas  POSTOPERATIVE DIAGNOSIS: Same.   PROCEDURES PERFORMED:   Classic pancreaticoduodenectomy   Placement of biliary and pancreatic stents  SURGEON: Stark Klein, MD   ASSISTANT: Nedra Hai, MD  ANESTHESIA: General and epidural   FINDINGS: 4 cm pancreatic head mass mostly in the uncinate process extending to the SMA.  Mass NOT adherent to any vasculature.  . Soft pancreatic tissue. 6 mm common bile duct. 1-2 mm pancreatic duct  SPECIMENS:  1. Pancreaticoduodenectomy with gallbladder:  2. Portal nodes   ESTIMATED BLOOD LOSS: 300 mL.   COMPLICATIONS: None known.   PROCEDURE:   Pt was identified in the holding area and taken to  the operating room, and placed supine on the operating room  table. General anesthesia was induced. A central line was placed by anesthesia.  The patient's abdomen was prepped and draped in a sterile fashion, after a Foley catheter was  placed. A time-out was performed according to the surgical safety check  list. When all was correct we continued.   A midline incision was made from the xiphoid to just below the umbilicus. The subcutaneous tissues were divided with the Bovie cautery. The peritoneum was entered in the center of the abdomen. Digital retraction was then used to elevate the preperitoneal fat, and  this was taken with the cautery as well. Care was taken to protect the underlying viscera.   The Bookwalter self-retaining retractor was placed for visualization. The right colon was taken down off of the white line of Toldt and from the retroperitoneum at the hepatic flexure. The porta was identified. The duodenum was kocherized extensively with blunt dissection and with cautery. The gallbladder was taken off the liver with a combination of blunt dissection and cautery. The cystic duct was clipped with the Hemalock clips. The cystic duct was divided and the gallbladder was passed off.    The common bile duct was skeletonized near the duodenum. A vessel loop was passed around it. The gastroduodenal artery, as well as the common hepatic artery were skeletonized. The proper hepatic artery was traced out to make sure that flow was going to both sides of the liver when the GDA was clamped. The GDA was test clamped with the bulldog, with good flow to the liver and no signs of ischemia. This was divided with 2-0 silk ties and then clipped. The proper hepatic artery was reflected upward, and the anterior portal vein was exposed. A Kelly clamp was passed underneath the pancreas at the superior mesenteric vein, and this passed easily with no signs of tumor involvement.   Attention was then directed to the stomach, and the omentum was taken  off of the stomach at the border of the antrum and the body. The  gastrohepatic ligament was taken down with the harmonic, and care was  taken to make sure there was not a replaced left hepatic artery in this  location. The stomach was divided with the GIA-75 stapler. The border  of the stomach was oversewn with a 3-0 running PDS suture.   Attention was then directed to the small bowel. Around 10 cm past the  ligament of Treitz was located, and this was divided with the 75-GIA.  The distal portion of the jejunum was also oversewn with a 3-0 PDS  suture. The fourth portion of the duodenum was skeletonized with the  harmonic scalpel, taking down all of the mesenteric vessels. The  ligament of Treitz was taken down. The  IMV was preserved.  The duodenum was then passed underneath the portal vein.   2-0 silk sutures were tied down and the inferior and superior border of the pancreas. At this point the Claiborne Billings was replaced and the pancreas was divided with the cautery.  The Bovie was used to coagulate the small bleeders at the border of the pancreas.  The Overholt in combination with the harmonic and locking Weck clips  were then used to take the uncinate  process off of the portal vein and  the superior mesenteric artery. Care was taken not to incorporate the  superior mesenteric artery in the dissection. The specimen was then marked and passed off the table for frozen section margin.   The jejunum was then passed underneath the SMV in order to get appropriate lie for the pancreatic and biliary anastomoses. The more distal portion of the jejunum was pulled up over  the colon, and two 3-0 silks were placed through the posterior border  of the stomach for the gastrojejunostomy. The stomach and the small  bowel were opened, and a GIA-75 was used to create an end-to-end  anastomosis. The open areas of the staple line were examined to ensure  that there was hemostasis. A segment of the NGT was stapled into the staple line. This was removed and that portion of the staple line was oversewed with interrupted 3-0 silks.  The defect was then closed with a single layer of running Connell suture of 3-0 PDS. Prior to a complete closure, the NG tube was passed toward the afferent limb.   The appropriate location for the choledochojejunostomy was identified, and  the small bowel was opened approximately 8-10 mm. The anastamosis was created with approximately sever 4-0 interrupted PDS sutures.   The 2 corner sutures were placed first  and then the posterior layer was done in an interrupted fashion tying on  the inside. An 8 mm pediatric feeding tube was used as a biliary stent.  The superior layer was then closed with interrupted sutures as well.   At this point the frozen margins returned back as all negative. 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  The pancreatic  anastomosis was then created. The pancreas was soft, and the duct was very small at 1-2 mm. A 5 Fr pediatric feeding tube was used as a pancreatic duct stent. The posterior layer was formed first with 2-0 silk sutures in interrupted fashion. The duct was identified and a  small 4 mm opening was made in the jejunum.  Four 5-0 PDS were used for the pancreatic duct to mucosa anastamosis.   The anterior layer was then oversewn with 2-0 silks to dunk the pancreatic parenchyma.   The areas were then irrigated and then those anastomoses were covered  with Vistaseal. This was allowed to dry. The abdomen was then irrigated  again and all the laparotomy sponges were removed. A lap count was performed, which was correct. Two 19-Blake drains were placed, with the lateral-most drain placed behind the choledochojejunostomy. The medial Blake drain was placed just anterior and slightly superior to the pancreaticojejunostomy.  The fascia was then closed with #1 looped running PDS sutures. The skin was irrigated and then closed with  staples. The wounds were cleaned, dried and dressed with a sterile  dressing. The provena incisional wound vac was used.    The patient tolerated the procedure well and was extubated and taken to  PACU in stable condition. Needle and sponge  counts were correct x2.

## 2019-04-15 NOTE — H&P (Signed)
Kathryn Stevenson Location: [facility] Patient #: [phone] DOB: 1976/01/01 Single / Language: Kathryn Stevenson / Race: Black or African American Female   History of Present Illness The patient is a 43 year old female who presents with a pancreatic mass. Pt is a 43 yo F referred for consultation by Dr. 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 The patient has been followed by GI. She was being worked up for potential gallbladder disease and a liver mass was seen on ultrasound. Follow up ultrasound showed no significant change (12/2017). She presented to the ED in January 2020 with elevated LFTs and was admitted. She received an MRI at that point that confirmed the liver lesion and noted it to be a hemangioma. Additionally, a mass was seen in the head of the pancreas. 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 She recovered from this infection and developed immunity. Due to the COVID 19 pandemic, she did not get EUS until this summer. 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 Of note, at her EGD/EUS she was noted to have H pylori. This was treated.   Her maternal grandmother had a history of ovarian cancer and her mother had cervical cancer, but she has no personal history of cancer.   She is currently feeling well. She denies diarrhea, weight loss, diabetes, abdominal/back pain. She denies any other new health problems.   CT abd/pelvis 12/30/2018 IMPRESSION: 1. Stable 3.3 x 2.2 cm solid pancreatic lesion in the pancreatic head/uncinate process. 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 Endoscopic biopsy suggested for confirmation. 2. Stable borderline celiac axis lymph node but no other enlarged lymph nodes. 3. Stable 3 cm right hepatic lobe hemangioma.  MR liver 07/13/2018 IMPRESSION: 1. Although the hepatic lesion of concern is shown  to represent a benign hepatic hemangioma, there is a concerning lesion measuring 3.2 by 2.0 by 2.3 cm in the pancreas along the uncinate process which demonstrates arterial phase hypoenhancement, portal venous phase isoenhancement, and mild hyperenhancement on the delayed phase. There is also pathologically enlarged adenopathy in the peripancreatic/porta hepatis region. The lesion is sharply defined and the delayed enhancement would be somewhat atypical for adenocarcinoma, but the constellation of findings indicates the need for tissue diagnosis as malignancy of the pancreas is a distinct possibility. 2. Mild cardiomegaly.  EUS 01/15/19 mansouraty EGD Impression: - No gross lesions in esophagus. - Z-line regular, 36 cm from the incisors. - Non-bleeding erosive gastropathy. Non-bleeding gastric ulcer with a clean ulcer base (Forrest Class III). Erythematous mucosa in the gastric body and antrum. Biopsied for HP. - No gross lesions in the duodenal bulb, in the first portion of the duodenum, in the second portion of the duodenum and in the major papilla. EUS Impression: - A mass was identified in the pancreatic head/uncinate region of the pancreas. Fine needle biopsy performed. Biopsies pending to rule out Adenocarcinoma (though clinical stability over 63-months suggests less likely and more likely Solid Psuedopapillary Tumore). - One enlarged lymph node was visualized in the para-aortic supra celiac region. The diagnosis is benign inflammatory changes. Fine needle biopsy performed. - There was no sign of significant pathology in the common bile duct or gallbaldder.  cytology pancreatic head mass 7/29 Diagnosis FINE NEEDLE ASPIRATION, ENDOSCOPIC, PANCREAS UNCINATE HEAD(SPECIMEN 1 OF 2 COLLECTED 01/15/19): NEOPLASTIC CELLS, SEE COMMENT.  Cytology 7/29 lymph node mansouraty Diagnosis FINE NEEDLE ASPIRATION, ENDOSCOPIC, PERI-AORTIC LYMPH NODE(SPECIMEN 2  OF 2 COLLECTED 01/15/19): BLAND  EPITHELIAL CELLS, SEE COMMENT  Surgical pathology gastric biopsy 7/29 Diagnosis Stomach, biopsy - CHRONIC ACTIVE GASTRITIS WITH HELICOBACTER PYLORI. - WARTHIN-STARRY IS POSITIVE FOR HELICOBACTER PYLORI. - NO INTESTINAL METAPLASIA, DYSPLASIA, OR MALIGNANCY.  Labs chromogranin A 02/14/19 normal at 129   Past Surgical History  No pertinent past surgical history   Diagnostic Studies History Colonoscopy  never Mammogram  1-3 years ago  Allergies  No Known Drug Allergies Allergies Reconciled   Medication History Pantoprazole Sodium (40MG  Tablet DR, Oral) Active. Medications Reconciled  Social History Alcohol use  Occasional alcohol use. Caffeine use  Carbonated beverages, Coffee, Tea. No drug use  Tobacco use  Former smoker.  Family History  Hypertension  Mother.  Pregnancy / Birth History Age at menarche  73 years. Gravida  5 Length (months) of breastfeeding  12-24 Maternal age  25-20 Para  2 Regular periods   Other Problems Gastric Ulcer  Gastroesophageal Reflux Disease  Hepatitis  Pancreatic Cancer     Review of Systems General Not Present- Appetite Loss, Chills, Fatigue, Fever, Night Sweats, Weight Gain and Weight Loss. Skin Not Present- Change in Wart/Mole, Dryness, Hives, Jaundice, New Lesions, Non-Healing Wounds, Rash and Ulcer. HEENT Not Present- Earache, Hearing Loss, Hoarseness, Nose Bleed, Oral Ulcers, Ringing in the Ears, Seasonal Allergies, Sinus Pain, Sore Throat, Visual Disturbances, Wears glasses/contact lenses and Yellow Eyes. Respiratory Not Present- Bloody sputum, Chronic Cough, Difficulty Breathing, Snoring and Wheezing. Breast Not Present- Breast Mass, Breast Pain, Nipple Discharge and Skin Changes. Cardiovascular Not Present- Chest Pain, Difficulty Breathing Lying Down, Leg Cramps, Palpitations, Rapid Heart Rate, Shortness of Breath and Swelling of Extremities. Gastrointestinal Not Present- Abdominal Pain, Bloating,  Bloody Stool, Change in Bowel Habits, Chronic diarrhea, Constipation, Difficulty Swallowing, Excessive gas, Gets full quickly at meals, Hemorrhoids, Indigestion, Nausea, Rectal Pain and Vomiting. Female Genitourinary Not Present- Frequency, Nocturia, Painful Urination, Pelvic Pain and Urgency. Musculoskeletal Not Present- Back Pain, Joint Pain, Joint Stiffness, Muscle Pain, Muscle Weakness and Swelling of Extremities. Neurological Not Present- Decreased Memory, Fainting, Headaches, Numbness, Seizures, Tingling, Tremor, Trouble walking and Weakness. Psychiatric Not Present- Anxiety, Bipolar, Change in Sleep Pattern, Depression, Fearful and Frequent crying. Endocrine Not Present- Cold Intolerance, Excessive Hunger, Hair Changes, Heat Intolerance, Hot flashes and New Diabetes. Hematology Not Present- Blood Thinners, Easy Bruising, Excessive bleeding, Gland problems, HIV and Persistent Infections.  Vitals Weight: 345.6 lb Height: 66in Body Surface Area: 2.52 m Body Mass Index: 55.78 kg/m  Temp.: 98.65F  Pulse: 82 (Regular)  P.OX: 82% (Room air) BP: 115/80 (Sitting, Left Arm, Standard)       Physical Exam General Mental Status-Alert. General Appearance-Consistent with stated age. Hydration-Well hydrated. Voice-Normal.  Head and Neck Head-normocephalic, atraumatic with no lesions or palpable masses. Trachea-midline. Thyroid Gland Characteristics - normal size and consistency.  Eye Eyeball - Bilateral-Extraocular movements intact. Sclera/Conjunctiva - Bilateral-No scleral icterus.  Chest and Lung Exam Chest and lung exam reveals -quiet, even and easy respiratory effort with no use of accessory muscles and on auscultation, normal breath sounds, no adventitious sounds and normal vocal resonance. Inspection Chest Wall - Normal. Back - normal.  Cardiovascular Cardiovascular examination reveals -normal heart sounds, regular rate and rhythm with no  murmurs and normal pedal pulses bilaterally.  Abdomen Inspection Inspection of the abdomen reveals - No Hernias. Palpation/Percussion Palpation and Percussion of the abdomen reveal - Soft, Non Tender, No Rebound tenderness, No Rigidity (guarding) and No hepatosplenomegaly. Auscultation Auscultation of the abdomen reveals - Bowel sounds normal.  Neurologic Neurologic  evaluation reveals -alert and oriented x 3 with no impairment of recent or remote memory. Mental Status-Normal.  Musculoskeletal Global Assessment -Note: no gross deformities.  Normal Exam - Left-Upper Extremity Strength Normal and Lower Extremity Strength Normal. Normal Exam - Right-Upper Extremity Strength Normal and Lower Extremity Strength Normal.  Lymphatic Head & Neck  General Head & Neck Lymphatics: Bilateral - Description - Normal. Axillary  General Axillary Region: Bilateral - Description - Normal. Tenderness - Non Tender. Femoral & Inguinal  Generalized Femoral & Inguinal Lymphatics: Bilateral - Description - No Generalized lymphadenopathy.    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 This is most consistent wtih solid pseudopapillary tumor. This will need to be removed. A whipple will be required.  I discussed the surgery with the patient including diagrams of anatomy. I discussed the potential for diagnostic laparoscopy. In the case of pancreatic cancer, if spread of the disease is found, we will abort the procedure and not proceed with resection. The rationale for this was discussed with the patient. There has not been data to support resection of Stage IV disease in terms of survival benefit.  We discussed possible complications including: Potential of aborting procedure if tumor is invading the superior mesenteric or hepatic arteries Bleeding Infection and possible wound  complications such as hernia Damage to adjacent structures Leak of anastamoses, primarily pancreatic Possible need for other procedures Possible prolonged nausea with possible need for external feeding. Possible prolonged hospital stay. Possible development of diabetes or worsening of current diabetes. Possible pancreatic exocrine insufficiency Prolonged fatigue/weakness/appetite Possible early recurrence of cancer   The patient understands and wishes to proceed. The patient has been advised to turn in disability paperwork to our office. Current Plans You are being scheduled for surgery- Our schedulers will call you.  You should hear from our office's scheduling department within 5 working days about the location, date, and time of surgery. We try to make accommodations for patient's preferences in scheduling surgery, but sometimes the OR schedule or the surgeon's schedule prevents [place] from making those accommodations.  If you have not heard from our office [phone]) in 5 working days, call the office and ask for your surgeon's nurse.  If you have other questions about your diagnosis, plan, or surgery, call the office and ask for your surgeon's nurse.  Pt Education - flb whipple pt info

## 2019-04-15 NOTE — Interval H&P Note (Signed)
History and Physical Interval Note:  04/15/2019 7:52 AM  Kathryn Stevenson  has presented today for surgery, with the diagnosis of MALIGNANT NEOPLASM HEAD OF PANCREAS.  The various methods of treatment have been discussed with the patient and family. After consideration of risks, benefits and other options for treatment, the patient has consented to  Procedure(s) with comments: WHIPPLE PROCEDURE (N/A) - EPIDURAL as a surgical intervention.  The patient's history has been reviewed, patient examined, no change in status, stable for surgery.  I have reviewed the patient's chart and labs.  Questions were answered to the patient's satisfaction.     Stark Klein

## 2019-04-15 NOTE — Anesthesia Procedure Notes (Signed)
Procedure Name: Intubation Date/Time: 04/15/2019 10:16 AM Performed by: Griffin Dakin, CRNA Pre-anesthesia Checklist: Patient identified, Emergency Drugs available, Suction available and Patient being monitored Patient Re-evaluated:Patient Re-evaluated prior to induction Oxygen Delivery Method: Circle system utilized Preoxygenation: Pre-oxygenation with 100% oxygen Induction Type: IV induction Ventilation: Mask ventilation without difficulty Laryngoscope Size: Glidescope and 3 Grade View: Grade I Tube type: Oral Tube size: 7.0 mm Number of attempts: 1 Airway Equipment and Method: Oral airway,  Video-laryngoscopy and Rigid stylet Placement Confirmation: ETT inserted through vocal cords under direct vision,  positive ETCO2 and breath sounds checked- equal and bilateral Secured at: 21 cm Tube secured with: Tape Dental Injury: Teeth and Oropharynx as per pre-operative assessment

## 2019-04-15 NOTE — Anesthesia Procedure Notes (Signed)
Arterial Line Insertion Start/End10/27/2020 8:45 AM, 04/15/2019 9:00 AM Performed by: Leonor Liv, CRNA, CRNA  Patient location: Pre-op. Preanesthetic checklist: patient identified, IV checked, site marked, risks and benefits discussed, surgical consent, monitors and equipment checked, pre-op evaluation, timeout performed and anesthesia consent Lidocaine 1% used for infiltration Right, radial was placed Catheter size: 20 G Hand hygiene performed  and maximum sterile barriers used  Allen's test indicative of satisfactory collateral circulation Attempts: 2 Procedure performed without using ultrasound guided technique. Following insertion, Biopatch and dressing applied. Post procedure assessment: normal  Patient tolerated the procedure well with no immediate complications.

## 2019-04-15 NOTE — Anesthesia Procedure Notes (Signed)
Central Venous Catheter Insertion Performed by: Roberts Gaudy, MD, anesthesiologist Start/End10/27/2020 10:00 AM, 04/15/2019 10:10 AM Patient location: Pre-op. Preanesthetic checklist: patient identified, IV checked, site marked, risks and benefits discussed, surgical consent, monitors and equipment checked, pre-op evaluation, timeout performed and anesthesia consent Lidocaine 1% used for infiltration and patient sedated Hand hygiene performed  and maximum sterile barriers used  Catheter size: 8 Fr Total catheter length 16. Central line was placed.Double lumen Procedure performed using ultrasound guided technique. Ultrasound Notes:image(s) printed for medical record Attempts: 1 Following insertion, dressing applied and line sutured. Post procedure assessment: blood return through all ports  Patient tolerated the procedure well with no immediate complications.

## 2019-04-16 ENCOUNTER — Encounter (HOSPITAL_COMMUNITY): Payer: Self-pay | Admitting: General Surgery

## 2019-04-16 LAB — PROTIME-INR
INR: 1.3 — ABNORMAL HIGH (ref 0.8–1.2)
Prothrombin Time: 15.8 seconds — ABNORMAL HIGH (ref 11.4–15.2)

## 2019-04-16 LAB — CBC
HCT: 30 % — ABNORMAL LOW (ref 36.0–46.0)
Hemoglobin: 10.6 g/dL — ABNORMAL LOW (ref 12.0–15.0)
MCH: 29.4 pg (ref 26.0–34.0)
MCHC: 35.3 g/dL (ref 30.0–36.0)
MCV: 83.3 fL (ref 80.0–100.0)
Platelets: 252 10*3/uL (ref 150–400)
RBC: 3.6 MIL/uL — ABNORMAL LOW (ref 3.87–5.11)
RDW: 15.8 % — ABNORMAL HIGH (ref 11.5–15.5)
WBC: 10.6 10*3/uL — ABNORMAL HIGH (ref 4.0–10.5)
nRBC: 0 % (ref 0.0–0.2)

## 2019-04-16 LAB — COMPREHENSIVE METABOLIC PANEL
ALT: 128 U/L — ABNORMAL HIGH (ref 0–44)
AST: 220 U/L — ABNORMAL HIGH (ref 15–41)
Albumin: 2.9 g/dL — ABNORMAL LOW (ref 3.5–5.0)
Alkaline Phosphatase: 62 U/L (ref 38–126)
Anion gap: 7 (ref 5–15)
BUN: 7 mg/dL (ref 6–20)
CO2: 22 mmol/L (ref 22–32)
Calcium: 7.6 mg/dL — ABNORMAL LOW (ref 8.9–10.3)
Chloride: 106 mmol/L (ref 98–111)
Creatinine, Ser: 0.78 mg/dL (ref 0.44–1.00)
GFR calc Af Amer: >60 mL/min (ref >60)
GFR calc non Af Amer: >60 mL/min (ref >60)
Glucose, Bld: 133 mg/dL — ABNORMAL HIGH (ref 70–99)
Potassium: 3.8 mmol/L (ref 3.5–5.1)
Sodium: 135 mmol/L (ref 135–145)
Total Bilirubin: 2.9 mg/dL — ABNORMAL HIGH (ref 0.3–1.2)
Total Protein: 5.7 g/dL — ABNORMAL LOW (ref 6.5–8.1)

## 2019-04-16 LAB — MAGNESIUM: Magnesium: 2 mg/dL (ref 1.7–2.4)

## 2019-04-16 LAB — PHOSPHORUS: Phosphorus: 2.8 mg/dL (ref 2.5–4.6)

## 2019-04-16 MED ORDER — MUPIROCIN 2 % EX OINT
1.0000 "application " | TOPICAL_OINTMENT | Freq: Two times a day (BID) | CUTANEOUS | Status: AC
  Administered 2019-04-16 – 2019-04-20 (×10): 1 via NASAL
  Filled 2019-04-16 (×2): qty 22

## 2019-04-16 NOTE — Addendum Note (Signed)
Addendum  created 04/16/19 0919 by Roberts Gaudy, MD   Clinical Note Signed

## 2019-04-16 NOTE — Evaluation (Signed)
Physical Therapy Evaluation Patient Details Name: Kathryn Stevenson MRN: [id number] DOB: 19-Aug-1975 Today's Date: 04/16/2019   History of Present Illness  pt is a 43 y/o female presenting with a pancreatic mass.  10/27 s/p classic pancreaticoduodenectomy and placement of biliary and pancreatic stents.  PMH:  Hep B, pancreatic/liver lesions.  Clinical Impression  Pt admitted with/for pancreatic mass, s/p resection and stenting as stated above..  Pt currently limited functionally due to the problems listed. ( See problems list.)   Pt will benefit from PT to maximize function and safety in order to get ready for next venue listed below.     Follow Up Recommendations CIR;Supervision/Assistance - 24 hour    Equipment Recommendations  Other (comment)(TBA)    Recommendations for Other Services Rehab consult     Precautions / Restrictions Precautions Precautions: Fall      Mobility  Bed Mobility Overal bed mobility: Needs Assistance Bed Mobility: Supine to Sit;Sit to Supine     Supine to sit: Mod assist;+2 for physical assistance Sit to supine: Max assist;+2 for physical assistance   General bed mobility comments: up via L elbow.  cues for technique, Due to abd/groin incision pain, assisted with trunk and legs.  Transfers Overall transfer level: Needs assistance   Transfers: Sit to/from Stand;Lateral/Scoot Transfers Sit to Stand: Max assist;+2 physical assistance        Lateral/Scoot Transfers: Max assist;+2 physical assistance General transfer comment: pt was anxious due to decreased L LE sensation, but was able to clear her rear and scoot toward [facility]  Ambulation/Gait             General Gait Details: unable today  Stairs            Wheelchair Mobility    Modified Rankin (Stroke Patients Only)       Balance Overall balance assessment: Needs assistance Sitting-balance support: No upper extremity supported;Single extremity supported Sitting balance-Leahy  Scale: Fair       Standing balance-Leahy Scale: Poor                               Pertinent Vitals/Pain Pain Assessment: Faces Faces Pain Scale: Hurts even more Pain Location: abdominal incisions Pain Descriptors / Indicators: Sharp Pain Intervention(s): Monitored during session;Limited activity within patient's tolerance;Premedicated before session    Home Living Family/patient expects to be discharged to:: Private residence Living Arrangements: Spouse/significant other Available Help at Discharge: Available 24 hours/day Type of Home: House Home Access: Stairs to enter Entrance Stairs-Rails: Psychiatric nurse of Steps: 2 Home Layout: One level Home Equipment: (no equiment)      Prior Function Level of Independence: Independent               Hand Dominance        Extremity/Trunk Assessment   Upper Extremity Assessment Upper Extremity Assessment: Overall WFL for tasks assessed(limited by muscle setting around the incision)    Lower Extremity Assessment Lower Extremity Assessment: RLE deficits/detail;LLE deficits/detail RLE Deficits / Details: WFL LLE Deficits / Details: weak at large muscle groups due to parasthesias suspected due to epidural LLE Sensation: decreased light touch;decreased proprioception LLE Coordination: decreased fine motor       Communication   Communication: No difficulties  Cognition Arousal/Alertness: Awake/alert Behavior During Therapy: WFL for tasks assessed/performed Overall Cognitive Status: Within Functional Limits for tasks assessed  General Comments General comments (skin integrity, edema, etc.): vss, sats 96-98% on NCa nd HR 70's    Exercises     Assessment/Plan    PT Assessment Patient needs continued PT services  PT Problem List Decreased strength;Decreased activity tolerance;Decreased balance;Decreased mobility;Decreased knowledge  of use of DME;Cardiopulmonary status limiting activity;Obesity;Pain       PT Treatment Interventions DME instruction;Gait training;Functional mobility training;Therapeutic activities;Balance training;Patient/family education    PT Goals (Current goals can be found in the Care Plan section)  Acute Rehab PT Goals Patient Stated Goal: back independent PT Goal Formulation: With patient Time For Goal Achievement: 04/30/19 Potential to Achieve Goals: Good    Frequency Min 3X/week   Barriers to discharge        Co-evaluation               AM-PAC PT "6 Clicks" Mobility  Outcome Measure Help needed turning from your back to your side while in a flat bed without using bedrails?: A Lot Help needed moving from lying on your back to sitting on the side of a flat bed without using bedrails?: A Lot Help needed moving to and from a bed to a chair (including a wheelchair)?: A Lot Help needed standing up from a chair using your arms (e.g., wheelchair or bedside chair)?: Total Help needed to walk in hospital room?: Total Help needed climbing 3-5 steps with a railing? : Total 6 Click Score: 9    End of Session Equipment Utilized During Treatment: Oxygen Activity Tolerance: Patient limited by pain Patient left: in bed;with call bell/phone within reach;with bed alarm set;with family/visitor present Nurse Communication: Mobility status PT Visit Diagnosis: Muscle weakness (generalized) (M62.81);Difficulty in walking, not elsewhere classified (R26.2);Pain;Other abnormalities of gait and mobility (R26.89) Pain - part of body: (abdomen)    Time: [id number] PT Time Calculation (min) (ACUTE ONLY): 37 min   Charges:   PT Evaluation $PT Eval Moderate Complexity: 1 Mod PT Treatments $Therapeutic Activity: 8-22 mins        04/16/2019  Donnella Sham, PT Acute Rehabilitation Services [phone]  (pager) [phone]  (office)  Tessie Fass Mottinger 04/16/2019, 6:14 PM

## 2019-04-16 NOTE — Progress Notes (Signed)
1 Day Post-Op   Subjective/Chief Complaint: Did Ok overnight.  Complains this AM of NGT pain in the throat and nausea.  Pain is tolerable.  Was very upset about last night's nursing situation.  Also complains of not being able to feel her legs.     Objective: Vital signs in last 24 hours: Temp:  [97.6 F (36.4 C)-98.5 F (36.9 C)] 98 F (36.7 C) (10/28 0400) Pulse Rate:  [69-88] 74 (10/28 0700) Resp:  [10-23] 15 (10/28 0800) BP: (93-160)/(63-94) 132/74 (10/28 0700) SpO2:  [91 %-100 %] 96 % (10/28 0800) Arterial Line BP: (94-208)/(63-94) 169/81 (10/28 0600) Last BM Date: 04/15/19  Intake/Output from previous day: 10/27 0701 - 10/28 0700 In: 5056.9 [I.V.:3976; NG/GT:30; IV Piggyback:950] Out: 1570 [Urine:770; Drains:200; Blood:300] Intake/Output this shift: No intake/output data recorded.  General appearance: alert, cooperative and mild distress Resp: breathing comfortably Cardio: regular rate and rhythm GI: soft appropriately tender.  provena wound vac in place.   Extremities: extremities normal, atraumatic, no cyanosis or edema drains serosang.  ngt with bilious output.  Lab Results:  Recent Labs    04/15/19 1429 04/16/19 0847  WBC  --  10.6*  HGB 11.6* 10.6*  HCT 34.0* 30.0*  PLT  --  252   BMET Recent Labs    04/15/19 1423 04/15/19 1429  NA 137 137  K 4.0 4.0  CL  --  104  GLUCOSE  --  137*  BUN  --  6  CREATININE  --  0.70   PT/INR Recent Labs    04/16/19 0847  LABPROT 15.8*  INR 1.3*   ABG Recent Labs    04/15/19 1335 04/15/19 1423  PHART 7.324* 7.352  HCO3 23.5 22.3    Studies/Results: Dg Chest Port 1 View  Result Date: 04/15/2019 CLINICAL DATA:  Central line placement EXAM: PORTABLE CHEST 1 VIEW COMPARISON:  Radiograph 07/13/2018 FINDINGS: Left IJ approach central venous catheter tip terminates at the brachiocephalic-caval confluence. Transesophageal tube tip and side port distal to the GE junction. Additional support devices overlie  the chest. The lungs are clear. No pneumothorax or effusion. Enlarged cardiac silhouette. Degenerative changes are present in the imaged shoulders. No acute osseous or soft tissue abnormality. IMPRESSION: 1. Left IJ approach central venous catheter tip terminates at the brachiocephalic-caval confluence. No pneumothorax. 2. Transesophageal tube tip and side port distal to the GE junction. 3. Prominent cardiac silhouette possibly accentuated by low volumes and portable technique. Electronically Signed   By: Lovena Le M.D.   On: 04/15/2019 18:31    Anti-infectives: Anti-infectives (From admission, onward)   Start     Dose/Rate Route Frequency Ordered Stop   04/15/19 2200  ceFAZolin (ANCEF) IVPB 2g/100 mL premix     2 g 200 mL/hr over 30 Minutes Intravenous Every 8 hours 04/15/19 1628 04/16/19 0007   04/15/19 0600  ceFAZolin (ANCEF) 3 g in dextrose 5 % 50 mL IVPB     3 g 100 mL/hr over 30 Minutes Intravenous On call to O.R. 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 IV tylenol.   Continue foley.   NPO/NGT.  Try to get OOB today.   Pulmonary toilet. Spoke to Therapist, art.  Apparently they were extremely short staffed last night.   Await return of bowel function. Pt has high risk of pancreatic leak due to very small duct and  soft pancreas.  Will be on the lookout for this and have low threshold to add antibiotics.    Hold oral meds at this point.      LOS: 1 day    Stark Klein 04/16/2019

## 2019-04-16 NOTE — Progress Notes (Signed)
Anesthesiology epidural follow-up:  Kathryn Stevenson is awake and alert complaining of nausea and discomfort with the NG tube.  She appears to have good  abdominal pain control with the epidural ropivacaine 8 cc/h.  She has bilateral lower extremity numbness and left leg weakness but denies back pain. Hemodynamically stable.  VS: T- 36.7 BP- 130/68 HR- 71 RR- 18 O2 Sat- 97% on RA   Adequate pain control with epidural Ropivacaine at 8 cc/hr. Will continue at present rate may need to decrease rate when ready for mobilization.  Roberts Gaudy

## 2019-04-17 ENCOUNTER — Other Ambulatory Visit: Payer: Self-pay

## 2019-04-17 ENCOUNTER — Inpatient Hospital Stay (HOSPITAL_COMMUNITY): Payer: 59

## 2019-04-17 LAB — CBC
HCT: 28.6 % — ABNORMAL LOW (ref 36.0–46.0)
Hemoglobin: 10.3 g/dL — ABNORMAL LOW (ref 12.0–15.0)
MCH: 30 pg (ref 26.0–34.0)
MCHC: 36 g/dL (ref 30.0–36.0)
MCV: 83.4 fL (ref 80.0–100.0)
Platelets: 210 10*3/uL (ref 150–400)
RBC: 3.43 MIL/uL — ABNORMAL LOW (ref 3.87–5.11)
RDW: 15.9 % — ABNORMAL HIGH (ref 11.5–15.5)
WBC: 13.9 10*3/uL — ABNORMAL HIGH (ref 4.0–10.5)
nRBC: 0 % (ref 0.0–0.2)

## 2019-04-17 LAB — URINALYSIS, COMPLETE (UACMP) WITH MICROSCOPIC
Bilirubin Urine: NEGATIVE
Glucose, UA: NEGATIVE mg/dL
Ketones, ur: NEGATIVE mg/dL
Leukocytes,Ua: NEGATIVE
Nitrite: NEGATIVE
Protein, ur: 100 mg/dL — AB
Specific Gravity, Urine: 1.019 (ref 1.005–1.030)
pH: 6 (ref 5.0–8.0)

## 2019-04-17 LAB — COMPREHENSIVE METABOLIC PANEL
ALT: 156 U/L — ABNORMAL HIGH (ref 0–44)
AST: 200 U/L — ABNORMAL HIGH (ref 15–41)
Albumin: 2.5 g/dL — ABNORMAL LOW (ref 3.5–5.0)
Alkaline Phosphatase: 74 U/L (ref 38–126)
Anion gap: 8 (ref 5–15)
BUN: <5 mg/dL — ABNORMAL LOW (ref 6–20)
CO2: 21 mmol/L — ABNORMAL LOW (ref 22–32)
Calcium: 7.2 mg/dL — ABNORMAL LOW (ref 8.9–10.3)
Chloride: 105 mmol/L (ref 98–111)
Creatinine, Ser: 0.73 mg/dL (ref 0.44–1.00)
GFR calc Af Amer: >60 mL/min (ref >60)
GFR calc non Af Amer: >60 mL/min (ref >60)
Glucose, Bld: 98 mg/dL (ref 70–99)
Potassium: 3.7 mmol/L (ref 3.5–5.1)
Sodium: 134 mmol/L — ABNORMAL LOW (ref 135–145)
Total Bilirubin: 2.9 mg/dL — ABNORMAL HIGH (ref 0.3–1.2)
Total Protein: 5.3 g/dL — ABNORMAL LOW (ref 6.5–8.1)

## 2019-04-17 LAB — TROPONIN I (HIGH SENSITIVITY)
Troponin I (High Sensitivity): 11 ng/L (ref <18)
Troponin I (High Sensitivity): 12 ng/L (ref <18)

## 2019-04-17 MED ORDER — ADENOSINE 6 MG/2ML IV SOLN
INTRAVENOUS | Status: AC
  Administered 2019-04-17: 6 mg via INTRAVENOUS
  Filled 2019-04-17: qty 2

## 2019-04-17 MED ORDER — ROPIVACAINE HCL 2 MG/ML IJ SOLN
8.0000 mL/h | INTRAMUSCULAR | Status: DC
  Administered 2019-04-17 – 2019-04-20 (×4): 8 mL/h via EPIDURAL
  Filled 2019-04-17 (×6): qty 200

## 2019-04-17 MED ORDER — PIPERACILLIN-TAZOBACTAM 3.375 G IVPB
3.3750 g | Freq: Three times a day (TID) | INTRAVENOUS | Status: DC
  Administered 2019-04-17 – 2019-04-23 (×17): 3.375 g via INTRAVENOUS
  Filled 2019-04-17 (×17): qty 50

## 2019-04-17 MED ORDER — ACETAMINOPHEN 650 MG RE SUPP
650.0000 mg | Freq: Four times a day (QID) | RECTAL | Status: DC | PRN
  Administered 2019-04-17: 650 mg via RECTAL
  Filled 2019-04-17: qty 1

## 2019-04-17 MED ORDER — ADENOSINE 6 MG/2ML IV SOLN
6.0000 mg | Freq: Once | INTRAVENOUS | Status: AC
  Administered 2019-04-17: 17:00:00 6 mg via INTRAVENOUS

## 2019-04-17 MED ORDER — VANCOMYCIN HCL 10 G IV SOLR
1500.0000 mg | Freq: Two times a day (BID) | INTRAVENOUS | Status: DC
  Administered 2019-04-17 – 2019-04-23 (×10): 1500 mg via INTRAVENOUS
  Filled 2019-04-17 (×15): qty 1500

## 2019-04-17 NOTE — Progress Notes (Signed)
2 Days Post-Op   Subjective/Chief Complaint: No nausea today.  Minimal NGT output.  Was OOB yesterday with PT.    Objective: Vital signs in last 24 hours: Temp:  [98.6 F (37 C)-100.9 F (38.3 C)] 99 F (37.2 C) (10/29 1200) Pulse Rate:  [82-100] 82 (10/29 1300) Resp:  [15-26] 18 (10/29 1347) BP: (114-137)/(65-81) 125/65 (10/29 1200) SpO2:  [90 %-97 %] 96 % (10/29 1347) Arterial Line BP: (71-166)/(61-76) 165/71 (10/29 1300) Last BM Date: 04/15/19  Intake/Output from previous day: 10/28 0701 - 10/29 0700 In: 2591.2 [I.V.:2207.2; IV Piggyback:200] Out: 950 [Urine:820; Emesis/NG output:20; Drains:110] Intake/Output this shift: Total I/O In: 630.6 [I.V.:582.7; Other:47.9] Out: -   General appearance: alert, cooperative and mild distress Resp: breathing comfortably Cardio: regular rate and rhythm GI: soft appropriately tender.  provena wound vac in place.   Extremities: extremities normal, atraumatic, no cyanosis or edema drains serosang.  ngt with bilious output.  Lab Results:  Recent Labs    04/16/19 0847 04/17/19 0534  WBC 10.6* 13.9*  HGB 10.6* 10.3*  HCT 30.0* 28.6*  PLT 252 210   BMET Recent Labs    04/16/19 0847 04/17/19 1030  NA 135 134*  K 3.8 3.7  CL 106 105  CO2 22 21*  GLUCOSE 133* 98  BUN 7 <5*  CREATININE 0.78 0.73  CALCIUM 7.6* 7.2*   PT/INR Recent Labs    04/16/19 0847  LABPROT 15.8*  INR 1.3*   ABG Recent Labs    04/15/19 1335 04/15/19 1423  PHART 7.324* 7.352  HCO3 23.5 22.3    Studies/Results: Dg Chest Port 1 View  Result Date: 04/15/2019 CLINICAL DATA:  Central line placement EXAM: PORTABLE CHEST 1 VIEW COMPARISON:  Radiograph 07/13/2018 FINDINGS: Left IJ approach central venous catheter tip terminates at the brachiocephalic-caval confluence. Transesophageal tube tip and side port distal to the GE junction. Additional support devices overlie the chest. The lungs are clear. No pneumothorax or effusion. Enlarged cardiac  silhouette. Degenerative changes are present in the imaged shoulders. No acute osseous or soft tissue abnormality. IMPRESSION: 1. Left IJ approach central venous catheter tip terminates at the brachiocephalic-caval confluence. No pneumothorax. 2. Transesophageal tube tip and side port distal to the GE junction. 3. Prominent cardiac silhouette possibly accentuated by low volumes and portable technique. Electronically Signed   By: Lovena Le M.D.   On: 04/15/2019 18:31    Anti-infectives: Anti-infectives (From admission, onward)   Start     Dose/Rate Route Frequency Ordered Stop   04/15/19 2200  ceFAZolin (ANCEF) IVPB 2g/100 mL premix     2 g 200 mL/hr over 30 Minutes Intravenous Every 8 hours 04/15/19 1628 04/16/19 0007   04/15/19 0600  ceFAZolin (ANCEF) 3 g in dextrose 5 % 50 mL IVPB     3 g 100 mL/hr over 30 Minutes Intravenous On call to O.R. 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 Continue foley.   D/c NGT today. Keep foley.  PT. Pulmonary toilet. Spoke to Therapist, art. [facility]BF. Probable stepdown tomorrow  Pt has high risk of pancreatic leak due to very small duct and soft pancreas.  Will be on the lookout for this and have low threshold to add antibiotics.    Hold oral meds at this point.      LOS: 2 days    Stark Klein 04/17/2019

## 2019-04-17 NOTE — Addendum Note (Signed)
Addendum  created 04/17/19 1613 by Myrtie Soman, MD   Clinical Note Signed

## 2019-04-17 NOTE — Progress Notes (Signed)
Rehab Admissions Coordinator Note:  Per PT this patient was screened by Raechel Ache for appropriateness for an Inpatient Acute Rehab Consult.  At this time, we are recommending Inpatient Rehab consult. AC will contact MD to request order.   Raechel Ache 04/17/2019, 7:46 AM  I can be reached at [phone].

## 2019-04-17 NOTE — Progress Notes (Signed)
1630:  Patient's heart rate elevated and sustained in 190s.  Patient had just finish working with therapy and was sitting in the chair for about 15 minutes.  Patient deny chest pain.  Trauma MD paged and order STAT 12 lead EKG, ambulate patient back in bed, and adenosine.  Trauma MD at bedside and RN pushed adenosine.  Patient converted back to sinus tachycardia.  Verbal orders for labs drawn.  Patient is stable, RN to continue to monitor.

## 2019-04-17 NOTE — Progress Notes (Signed)
Pharmacy Antibiotic Note  Kathryn Stevenson is a 43 y.o. female admitted on 04/15/2019 with whipple procedure. This afternoon pt noted to be tachycardic with fever, pharmacy has been consulted for vancomycin and Zosyn dosing. Cr stable post-op ~0.7 mg/dl.   Plan: Zosyn 3.375g IV EI q8h Vancomycin 1500mg  IV q12h - est AUC 509 Follow Cr, cultures, fevers Vancomycin levels as needed   Height: 5\' 6"  (167.6 cm) Weight: (!) 351 lb 3.1 oz (159.3 kg) IBW/kg (Calculated) : 59.3  Temp (24hrs), Avg:99.6 F (37.6 C), Min:98.6 F (37 C), Max:102 F (38.9 C)  Recent Labs  Lab 04/15/19 1429 04/16/19 0847 04/17/19 0534 04/17/19 1030  WBC  --  10.6* 13.9*  --   CREATININE 0.70 0.78  --  0.73    Estimated Creatinine Clearance: 140.7 mL/min (by C-G formula based on SCr of 0.73 mg/dL).    No Known Allergies  Antimicrobials this admission: Vancomycin 10/29 >>  Zosyn 10/29 >>   Dose adjustments this admission: none  Microbiology results: sent  Thank you for allowing pharmacy to be a part of this patient's care.   Arrie Senate, PharmD, BCPS Clinical Pharmacist [phone] Please check AMION for all [place] numbers 04/17/2019

## 2019-04-17 NOTE — Anesthesia Post-op Follow-up Note (Signed)
  Anesthesia Pain Follow-up Note  Patient: Kathryn Stevenson  Day #: 2  Date of Follow-up: 04/17/2019 Time: 4:11 PM  Last Vitals:  Vitals:   04/17/19 1500 04/17/19 1554  BP: 138/77   Pulse: 86   Resp: (!) 22   Temp:  (!) 38.9 C  SpO2: 96%     Level of Consciousness: alert  Pain: mild   Side Effects:None  Catheter Site Exam:clean  Epidural / Intrathecal (From admission, onward)   Start     Dose/Rate Route Frequency Ordered Stop   04/17/19 0900  ropivacaine (PF) 2 mg/mL (0.2%) (NAROPIN) injection     8 mL/hr 8 mL/hr  Epidural Continuous 04/17/19 0854         Plan: Continue current therapy of postop epidural at surgeon's request. Temperature level bilaterally to T9  ROSE,GEORGE S

## 2019-04-17 NOTE — Progress Notes (Addendum)
Physical Therapy Treatment Patient Details Name: Kathryn Stevenson MRN: [id number] DOB: 1975/12/01 Today's Date: 04/17/2019    History of Present Illness pt is a 43 y/o female presenting with a pancreatic mass.  10/27 s/p classic pancreaticoduodenectomy and placement of biliary and pancreatic stents.  PMH:  Hep B, pancreatic/liver lesions.    PT Comments    Pt a little more sore today due to excess coughing.  Emphasis on transition to EOB, scooting to EOB, balancing at EOB without UE assist and ambulation a short distance to pivot to the recliner.   Follow Up Recommendations  CIR;Supervision/Assistance - 24 hour     Equipment Recommendations  Other (comment)    Recommendations for Other Services Rehab consult     Precautions / Restrictions Precautions Precautions: Fall    Mobility  Bed Mobility Overal bed mobility: Needs Assistance Bed Mobility: Sit to Supine;Sidelying to Sit   Sidelying to sit: Mod assist   Sit to supine: Mod assist;+2 for physical assistance   General bed mobility comments: pt more able to assist LE's off the EOB, roll to L side, but still need moderate truncal assist to come up from side-lying.  Transfers Overall transfer level: Needs assistance Equipment used: None Transfers: Sit to/from [facility] Sit to Stand: Min assist Stand pivot transfers: Min assist      Lateral/Scoot Transfers: Min assist General transfer comment: Due to more sensation L LE due to decrease effect of epidural, pt able to stand up much more easity today, with improved w/shift, improved w/bearing l LE  Ambulation/Gait Ambulation/Gait assistance: Min assist Gait Distance (Feet): 5 pivot steps to the chair.  Assistive device: None;1 person hand held assist Gait Pattern/deviations: Step-through pattern;Decreased step length - right;Decreased step length - left;Decreased stride length Gait velocity: slower Gait velocity interpretation: <1.31 ft/sec,  indicative of household ambulator General Gait Details: waddling, short, steps. pt felt more confindent on her L LE today, but needed face to face assist for stability   Stairs             Wheelchair Mobility    Modified Rankin (Stroke Patients Only)       Balance Overall balance assessment: Needs assistance Sitting-balance support: No upper extremity supported;Single extremity supported Sitting balance-Leahy Scale: Fair Sitting balance - Comments: sat EOB with UE's more than without, but able to sit EOB without struggle.     Standing balance-Leahy Scale: Poor Standing balance comment: Improved stance with UE's assist for stability                             Cognition Arousal/Alertness: Awake/alert Behavior During Therapy: WFL for tasks assessed/performed Overall Cognitive Status: Within Functional Limits for tasks assessed                                        Exercises Other Exercises Other Exercises: warm up LE exercise.    General Comments General comments (skin integrity, edema, etc.): VSS with mild tachycardia      Pertinent Vitals/Pain Pain Assessment: Faces Faces Pain Scale: Hurts little more Pain Location: abdominal incisions Pain Descriptors / Indicators: Sharp Pain Intervention(s): Monitored during session    Home Living                      Prior Function  PT Goals (current goals can now be found in the care plan section) Acute Rehab PT Goals Patient Stated Goal: back independent PT Goal Formulation: With patient Time For Goal Achievement: 04/30/19 Potential to Achieve Goals: Good Progress towards PT goals: Progressing toward goals    Frequency    Min 3X/week      PT Plan Current plan remains appropriate    Co-evaluation              AM-PAC PT "6 Clicks" Mobility   Outcome Measure  Help needed turning from your back to your side while in a flat bed without using  bedrails?: A Lot Help needed moving from lying on your back to sitting on the side of a flat bed without using bedrails?: A Lot Help needed moving to and from a bed to a chair (including a wheelchair)?: A Lot Help needed standing up from a chair using your arms (e.g., wheelchair or bedside chair)?: A Little Help needed to walk in hospital room?: A Little Help needed climbing 3-5 steps with a railing? : A Lot 6 Click Score: 14    End of Session Equipment Utilized During Treatment: Oxygen Activity Tolerance: Patient limited by pain Patient left: in chair;with call bell/phone within reach;with family/visitor present Nurse Communication: Mobility status PT Visit Diagnosis: Muscle weakness (generalized) (M62.81);Difficulty in walking, not elsewhere classified (R26.2);Pain;Other abnormalities of gait and mobility (R26.89) Pain - part of body: (abdomen)     Time: [id number] PT Time Calculation (min) (ACUTE ONLY): 23 min  Charges:  $Therapeutic Activity: 23-37 mins                     04/17/2019  Kathryn Stevenson, PT Acute Rehabilitation Services [phone]  (pager) [phone]  (office)   Kathryn Stevenson 04/17/2019, 5:45 PM

## 2019-04-17 NOTE — Progress Notes (Signed)
Called for pt HR 190s sustained.  BP 170s.  Got out of bed to chair and HR spiked up.  Also, pt had fever.    They got pt back in bed and got EKG.  This was c/w SVT.  Gave 6 mg adenosine with flush.    Immediate conversion to sinus tach with rates 100-115.    SBP 130s.    Pt felt much better at that time.    Requesting bath.    Labs sent off.    Given that pt is POD 2, fever could represent atelectasis, but will start empiric antibiotics as pt is high risk for leak.    Will get U/A prior to starting antibiotics.

## 2019-04-18 ENCOUNTER — Inpatient Hospital Stay (HOSPITAL_COMMUNITY): Payer: 59 | Admitting: Anesthesiology

## 2019-04-18 LAB — COMPREHENSIVE METABOLIC PANEL
ALT: 117 U/L — ABNORMAL HIGH (ref 0–44)
AST: 109 U/L — ABNORMAL HIGH (ref 15–41)
Albumin: 2.3 g/dL — ABNORMAL LOW (ref 3.5–5.0)
Alkaline Phosphatase: 74 U/L (ref 38–126)
Anion gap: 9 (ref 5–15)
BUN: 5 mg/dL — ABNORMAL LOW (ref 6–20)
CO2: 22 mmol/L (ref 22–32)
Calcium: 7.1 mg/dL — ABNORMAL LOW (ref 8.9–10.3)
Chloride: 102 mmol/L (ref 98–111)
Creatinine, Ser: 0.84 mg/dL (ref 0.44–1.00)
GFR calc Af Amer: >60 mL/min (ref >60)
GFR calc non Af Amer: >60 mL/min (ref >60)
Glucose, Bld: 106 mg/dL — ABNORMAL HIGH (ref 70–99)
Potassium: 3.4 mmol/L — ABNORMAL LOW (ref 3.5–5.1)
Sodium: 133 mmol/L — ABNORMAL LOW (ref 135–145)
Total Bilirubin: 3.3 mg/dL — ABNORMAL HIGH (ref 0.3–1.2)
Total Protein: 5.7 g/dL — ABNORMAL LOW (ref 6.5–8.1)

## 2019-04-18 LAB — CBC
HCT: 27.5 % — ABNORMAL LOW (ref 36.0–46.0)
Hemoglobin: 9.8 g/dL — ABNORMAL LOW (ref 12.0–15.0)
MCH: 29.8 pg (ref 26.0–34.0)
MCHC: 35.6 g/dL (ref 30.0–36.0)
MCV: 83.6 fL (ref 80.0–100.0)
Platelets: 206 10*3/uL (ref 150–400)
RBC: 3.29 MIL/uL — ABNORMAL LOW (ref 3.87–5.11)
RDW: 15.6 % — ABNORMAL HIGH (ref 11.5–15.5)
WBC: 16.8 10*3/uL — ABNORMAL HIGH (ref 4.0–10.5)
nRBC: 0 % (ref 0.0–0.2)

## 2019-04-18 LAB — URINE CULTURE: Culture: NO GROWTH

## 2019-04-18 LAB — PHOSPHORUS: Phosphorus: 1.6 mg/dL — ABNORMAL LOW (ref 2.5–4.6)

## 2019-04-18 LAB — MAGNESIUM: Magnesium: 1.9 mg/dL (ref 1.7–2.4)

## 2019-04-18 MED ORDER — POTASSIUM PHOSPHATES 15 MMOLE/5ML IV SOLN
30.0000 meq | Freq: Once | INTRAVENOUS | Status: AC
  Administered 2019-04-18: 16:00:00 30 meq via INTRAVENOUS
  Filled 2019-04-18: qty 6.82

## 2019-04-18 NOTE — Progress Notes (Signed)
Inpatient Rehab Admissions:  Inpatient Rehab Consult received.  I met with patient and her husband at the bedside for rehabilitation assessment and to discuss goals and expectations of an inpatient rehab admission.  They are both hopeful for CIR admission.  Will open insurance for authorization for possible admission next week pending medical readiness, bed availability, and insurance approval.   Signed: Shann Medal, PT, DPT Admissions Coordinator [phone] 04/18/19  4:05 PM

## 2019-04-18 NOTE — Progress Notes (Signed)
Physical Therapy Treatment Patient Details Name: Kathryn Stevenson MRN: [id number] DOB: Jul 09, 1975 Today's Date: 04/18/2019    History of Present Illness pt is a 43 y/o female presenting with a pancreatic mass.  10/27 s/p classic pancreaticoduodenectomy and placement of biliary and pancreatic stents. Post op central incisional wound VAC, R LQ drains x 2, epidural for pain management and PCA pump.  PMH:  Hep B, pancreatic/liver lesions.    PT Comments    Pt's VS seem more stable today with HR max observed 110 during PT/OT co session.  RN monitoring post transfer as it took ~15 mins yesterday before she went into SVT with rates in the 190s (per RN TK).  Pt is ready to progress to short distance gait, but will need chair to follow for safety and bariatric RW.  She continues to be open to intensive inpatient rehab, but is hopeful to progress well enough not to need it.  We discussed that she continues to be on significant pain control support and she may feel differently when the epidural and PCA are gone.   PT will continue to follow acutely for safe mobility progression  Follow Up Recommendations  CIR;Supervision/Assistance - 24 hour     Equipment Recommendations  Rolling walker with 5" wheels(bariatric)    Recommendations for Other Services   NA     Precautions / Restrictions Precautions Precautions: Fall Precaution Comments: epidural limiting sensation in legs    Mobility  Bed Mobility Overal bed mobility: Needs Assistance Bed Mobility: Rolling;Sidelying to Sit Rolling: Mod assist;+2 for safety/equipment Sidelying to sit: Mod assist;+2 for safety/equipment;HOB elevated       General bed mobility comments: Light mod two person assist to pull up to partial side lying and come up to sitting EOB.  Pt utilizing railing for support and assisted in progressing bil legs over the EOB.   Transfers Overall transfer level: Needs assistance Equipment used: 2 person hand held  assist Transfers: Sit to/from Stand Sit to Stand: +2 physical assistance;Min assist;From elevated surface Stand pivot transfers: +2 physical assistance;Min assist;From elevated surface       General transfer comment: Two person min assist to stand and take pivotal steps OOB to chair.  Initially she was anteriorly preferenced with trunk ahead of legs, but was able to get legs underneath her and make the transfer.  Due to epidural pt has continued decreased sensation in L>R right now.   Ambulation/Gait             General Gait Details: Did not gait any significant distance, just bed to chair.  Would like to assess short distance gait with chair to follow and bariatric RW next session.            Balance Overall balance assessment: Needs assistance Sitting-balance support: Feet supported;Bilateral upper extremity supported;No upper extremity supported Sitting balance-Leahy Scale: Fair Sitting balance - Comments: sat EOB with UE's more than without, but able to sit EOB without struggle.close supervision EOB, sat for ~ 5 mins to ensure stability before transfer OOB to chair.  Educated pt on the importance of assessing how she feels with every transition (supine to sit, sit to stand) a bit longer than usual.  Postural control: (inital anterior lean)   Standing balance-Leahy Scale: Poor Standing balance comment: Min hand held assist EOB, especially until she got her feet under her and adjusted anterior trunk preference.  Cognition Arousal/Alertness: Lethargic;Suspect due to medications Behavior During Therapy: WFL for tasks assessed/performed Overall Cognitive Status: Within Functional Limits for tasks assessed                                 General Comments: not formally assessed      Exercises      General Comments General comments (skin integrity, edema, etc.): VSS, HR max observed during mobility was 110.  RN very aware  of SVT that occured ~15 mins after pt got OOB to chair and will monitor her closely.  Discussed post acute rehab vs home with home therapy and educated pt that it really depends on how she progresses (if she is safe to d/c home) and how she does once the epidural and the PCA pump are d/c and she is switched over to pain pills.        Pertinent Vitals/Pain Pain Assessment: Faces Faces Pain Scale: Hurts little more Pain Location: abdominal incisions Pain Descriptors / Indicators: Grimacing;Guarding Pain Intervention(s): Limited activity within patient's tolerance;Monitored during session;Repositioned;PCA encouraged           PT Goals (current goals can now be found in the care plan section) Acute Rehab PT Goals Patient Stated Goal: to go home when she can Progress towards PT goals: Progressing toward goals    Frequency    Min 3X/week      PT Plan Current plan remains appropriate    Co-evaluation PT/OT/SLP Co-Evaluation/Treatment: Yes Reason for Co-Treatment: For patient/therapist safety;To address functional/ADL transfers;Complexity of the patient's impairments (multi-system involvement)          AM-PAC PT "6 Clicks" Mobility   Outcome Measure  Help needed turning from your back to your side while in a flat bed without using bedrails?: A Lot Help needed moving from lying on your back to sitting on the side of a flat bed without using bedrails?: A Lot Help needed moving to and from a bed to a chair (including a wheelchair)?: A Little Help needed standing up from a chair using your arms (e.g., wheelchair or bedside chair)?: A Little Help needed to walk in hospital room?: A Lot Help needed climbing 3-5 steps with a railing? : Total 6 Click Score: 13    End of Session   Activity Tolerance: Patient limited by pain Patient left: in chair;with call bell/phone within reach;with family/visitor present Nurse Communication: Mobility status PT Visit Diagnosis: Muscle weakness  (generalized) (M62.81);Difficulty in walking, not elsewhere classified (R26.2);Pain;Other abnormalities of gait and mobility (R26.89) Pain - part of body: (abdomen)     Time: [id number] PT Time Calculation (min) (ACUTE ONLY): 27 min  Charges:  $Therapeutic Activity: 8-22 mins                     B. , PT, DPT  Acute Rehabilitation [phone] pager [phone] office  @ [facility]: [phone]   04/18/2019, 12:13 PM

## 2019-04-18 NOTE — Progress Notes (Signed)
3 Days Post-Op   Subjective/Chief Complaint: Had fever last night.  CXR and u/a was unexciting.  Also, she had SVT when she got out of bed to chair.  Required adenosine to break it.  No flatus yet, but no n/v with NGT out.    Objective: Vital signs in last 24 hours: Temp:  [98.5 F (36.9 C)-102 F (38.9 C)] 99 F (37.2 C) (10/30 0400) Pulse Rate:  [82-119] 99 (10/30 0500) Resp:  [18-28] 26 (10/30 0500) BP: (102-160)/(43-97) 160/88 (10/30 0500) SpO2:  [93 %-98 %] 94 % (10/30 0500) Arterial Line BP: (129-165)/(62-75) 156/65 (10/29 1400) Last BM Date: 04/15/19  Intake/Output from previous day: 10/29 0701 - 10/30 0700 In: 2594.8 [I.V.:1861; IV Piggyback:549.9] Out: 1650 [Urine:1600; Drains:50] Intake/Output this shift: Total I/O In: 1076.2 [I.V.:653.2; Other:80; IV Piggyback:343] Out: 1020 [Urine:1000; Drains:20]  General appearance: alert, cooperative and mild distress Resp: breathing comfortably Cardio: regular rate and rhythm GI: soft appropriately tender.  provena wound vac in place.   Extremities: extremities normal, atraumatic, no cyanosis or edema drains serosang, low output.   Lab Results:  Recent Labs    04/17/19 0534 04/18/19 0444  WBC 13.9* 16.8*  HGB 10.3* 9.8*  HCT 28.6* 27.5*  PLT 210 206   BMET Recent Labs    04/16/19 0847 04/17/19 1030  NA 135 134*  K 3.8 3.7  CL 106 105  CO2 22 21*  GLUCOSE 133* 98  BUN 7 <5*  CREATININE 0.78 0.73  CALCIUM 7.6* 7.2*   PT/INR Recent Labs    04/16/19 0847  LABPROT 15.8*  INR 1.3*   ABG Recent Labs    04/15/19 1335 04/15/19 1423  PHART 7.324* 7.352  HCO3 23.5 22.3    Studies/Results: Dg Chest Port 1 View  Result Date: 04/17/2019 CLINICAL DATA:  43 year old female with fever. EXAM: PORTABLE CHEST 1 VIEW COMPARISON:  Chest radiograph dated 04/15/2019 FINDINGS: Interval removal of the previously seen enteric tube. Minimal hazy density at the left lung base may represent atelectasis. A small left  pleural effusion is not excluded. The right lung is clear. There is no pneumothorax. Left IJ central venous line with tip over SVC at the level of left innominate vein in similar position. Stable mild cardiomegaly. No acute osseous pathology. IMPRESSION: 1. Minimal hazy density at the left lung base likely atelectasis. A small left pleural effusion is less likely. 2. Stable mild cardiomegaly. 3. Interval removal of the enteric tube. The left IJ line is in similar position. Electronically Signed   By: Anner Crete M.D.   On: 04/17/2019 19:25    Anti-infectives: Anti-infectives (From admission, onward)   Start     Dose/Rate Route Frequency Ordered Stop   04/17/19 1830  vancomycin (VANCOCIN) 1,500 mg in sodium chloride 0.9 % 500 mL IVPB     1,500 mg 250 mL/hr over 120 Minutes Intravenous Every 12 hours 04/17/19 1704     04/17/19 1800  piperacillin-tazobactam (ZOSYN) IVPB 3.375 g     3.375 g 12.5 mL/hr over 240 Minutes Intravenous Every 8 hours 04/17/19 1704     04/15/19 2200  ceFAZolin (ANCEF) IVPB 2g/100 mL premix     2 g 200 mL/hr over 30 Minutes Intravenous Every 8 hours 04/15/19 1628 04/16/19 0007   04/15/19 0600  ceFAZolin (ANCEF) 3 g in dextrose 5 % 50 mL IVPB     3 g 100 mL/hr over 30 Minutes Intravenous On call to O.R. 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 Continue foley.   Clear liq diet.   PT. Pulmonary toilet. ARBF.  Pt has high risk of pancreatic leak due to very small duct and soft pancreas.  Will be on the lookout for this and have low threshold to add antibiotics.    Was going to transfer, but would like one more day given SVT last night.  .      LOS: 3 days    Stark Klein 04/18/2019

## 2019-04-18 NOTE — Anesthesia Post-op Follow-up Note (Signed)
  Anesthesia Pain Follow-up Note  Patient: Kathryn Stevenson  Day #: 3  Date of Follow-up: 04/18/2019 Time: 8:53 AM  Last Vitals:  Vitals:   04/18/19 0700 04/18/19 0800  BP: 137/71 (!) 143/73  Pulse: 98 97  Resp: (!) 27 (!) 29  Temp: 37.8 C   SpO2: 96% 96%    Level of Consciousness: alert  Pain: 3 /10, no incisional pain  Side Effects:None  Catheter Site Exam:clean, dry  Epidural / Intrathecal (From admission, onward)   Start     Dose/Rate Route Frequency Ordered Stop   04/17/19 0900  ropivacaine (PF) 2 mg/mL (0.2%) (NAROPIN) injection     8 mL/hr 8 mL/hr  Epidural Continuous 04/17/19 0854         Plan: Continue current therapy of postop epidural at surgeon's request  Effie Berkshire

## 2019-04-18 NOTE — Evaluation (Signed)
Occupational Therapy Evaluation Patient Details Name: Kathryn Stevenson MRN: [id number] DOB: 07/24/1975 Today's Date: 04/18/2019    History of Present Illness pt is a 43 y/o female presenting with a pancreatic mass.  10/27 s/p classic pancreaticoduodenectomy and placement of biliary and pancreatic stents. Post op central incisional wound VAC, R LQ drains x 2, epidural for pain management and PCA pump.  PMH:  Hep B, pancreatic/liver lesions.   Clinical Impression   Patient is s/p classic pancreaticoduodenectomy and placement of biliary and pancreatic stents surgery resulting in functional limitations due to the deficits listed below (see OT problem list).  Pt requires total +2 mod (A) for basic transfer. Pt with stable VSS during session max HR 110. Encouraged oob throughout weekend with staff.  Patient will benefit from skilled OT acutely to increase independence and safety with ADLS to allow discharge CIR.     Follow Up Recommendations  CIR    Equipment Recommendations  3 in 1 bedside commode;Other (comment)(RW)    Recommendations for Other Services Rehab consult     Precautions / Restrictions Precautions Precautions: Fall Precaution Comments: epidural limiting sensation in legs      Mobility Bed Mobility Overal bed mobility: Needs Assistance Bed Mobility: Rolling;Sidelying to Sit Rolling: Mod assist;+2 for safety/equipment Sidelying to sit: Mod assist;+2 for safety/equipment;HOB elevated       General bed mobility comments: Light mod two person assist to pull up to partial side lying and come up to sitting EOB.  Pt utilizing railing for support and assisted in progressing bil legs over the EOB.   Transfers Overall transfer level: Needs assistance Equipment used: 2 person hand held assist Transfers: Sit to/from Stand Sit to Stand: +2 physical assistance;Min assist;From elevated surface Stand pivot transfers: +2 physical assistance;Min assist;From elevated surface        General transfer comment: Two person min assist to stand and take pivotal steps OOB to chair.  Initially she was anteriorly preferenced with trunk ahead of legs, but was able to get legs underneath her and make the transfer.  Due to epidural pt has continued decreased sensation in L>R right now.     Balance Overall balance assessment: Needs assistance Sitting-balance support: Feet supported;Bilateral upper extremity supported;No upper extremity supported Sitting balance-Leahy Scale: Fair Sitting balance - Comments: sat EOB with UE's more than without, but able to sit EOB without struggle.close supervision EOB, sat for ~ 5 mins to ensure stability before transfer OOB to chair.  Educated pt on the importance of assessing how she feels with every transition (supine to sit, sit to stand) a bit longer than usual.  Postural control: (inital anterior lean)   Standing balance-Leahy Scale: Poor Standing balance comment: Min hand held assist EOB, especially until she got her feet under her and adjusted anterior trunk preference.                            ADL either performed or assessed with clinical judgement   ADL Overall ADL's : Needs assistance/impaired Eating/Feeding: Independent   Grooming: Wash/dry face;Wash/dry hands;Applying deodorant;Modified independent;Sitting Grooming Details (indicate cue type and reason): requires seate positioning and all items on tray. pt will need increase help with static standing at sink Upper Body Bathing: Minimal assistance;Sitting   Lower Body Bathing: Maximal assistance   Upper Body Dressing : Minimal assistance   Lower Body Dressing: Maximal assistance   Toilet Transfer: Moderate assistance;+2 for physical assistance;BSC;Stand-pivot  General ADL Comments: pt thanking therapist for setting up grooming task as she has not completed those task this admission. pt pleasant and agreeable to all opportunities to participate      Vision         Perception     Praxis      Pertinent Vitals/Pain Pain Assessment: Faces Faces Pain Scale: Hurts little more Pain Location: abdominal incisions Pain Descriptors / Indicators: Grimacing;Guarding Pain Intervention(s): Limited activity within patient's tolerance;Repositioned     Hand Dominance Right   Extremity/Trunk Assessment Upper Extremity Assessment Upper Extremity Assessment: Overall WFL for tasks assessed   Lower Extremity Assessment Lower Extremity Assessment: Defer to PT evaluation   Cervical / Trunk Assessment Cervical / Trunk Assessment: Normal   Communication Communication Communication: No difficulties   Cognition Arousal/Alertness: Lethargic;Suspect due to medications Behavior During Therapy: [facility] for tasks assessed/performed Overall Cognitive Status: Within Functional Limits for tasks assessed                                 General Comments: not formally assessed   General Comments  VSS max HR 110     Exercises     Shoulder Instructions      Home Living Family/patient expects to be discharged to:: Private residence Living Arrangements: Spouse/significant other Available Help at Discharge: Available 24 hours/day Type of Home: House Home Access: Stairs to enter [employer] of Steps: 2 Entrance Stairs-Rails: Right;Left Home Layout: One level     Bathroom Shower/Tub: Teacher, early years/pre: Handicapped height Bathroom Accessibility: Yes              Prior Functioning/Environment Level of Independence: Independent                 OT Problem List: Decreased strength;Decreased activity tolerance;Impaired balance (sitting and/or standing);Decreased safety awareness;Decreased knowledge of use of DME or AE;Decreased knowledge of precautions;Obesity;Pain      OT Treatment/Interventions: Self-care/ADL training;Therapeutic exercise;Neuromuscular education;Energy conservation;DME  and/or AE instruction;Manual therapy;Therapeutic activities;Patient/family education;Balance training    OT Goals(Current goals can be found in the care plan section) Acute Rehab OT Goals Patient Stated Goal: to get my teeth brushed OT Goal Formulation: With patient Time For Goal Achievement: 05/02/19 Potential to Achieve Goals: Good  OT Frequency: Min 2X/week   Barriers to D/C:            Co-evaluation PT/OT/SLP Co-Evaluation/Treatment: Yes Reason for Co-Treatment: For patient/therapist safety;To address functional/ADL transfers   OT goals addressed during session: ADL's and self-care;Proper use of Adaptive equipment and DME;Strengthening/ROM      AM-PAC OT "6 Clicks" Daily Activity     Outcome Measure Help from another person eating meals?: None Help from another person taking care of personal grooming?: A Little Help from another person toileting, which includes using toliet, bedpan, or urinal?: A Lot Help from another person bathing (including washing, rinsing, drying)?: A Lot Help from another person to put on and taking off regular upper body clothing?: A Little Help from another person to put on and taking off regular lower body clothing?: A Lot 6 Click Score: 16   End of Session Nurse Communication: Mobility status;Precautions  Activity Tolerance: Patient tolerated treatment well Patient left: in chair;with call bell/phone within reach;with chair alarm set  OT Visit Diagnosis: Unsteadiness on feet (R26.81);Muscle weakness (generalized) (M62.81)                Time: [id number] OT Time  Calculation (min): 21 min Charges:  OT General Charges $OT Visit: 1 Visit OT Evaluation $OT Eval Moderate Complexity: 1 Mod   Brynn, OTR/L  Acute Rehabilitation Services Pager: [phone] Office: [phone] .   Jeri Modena 04/18/2019, 3:23 PM

## 2019-04-19 LAB — CBC
HCT: 26.5 % — ABNORMAL LOW (ref 36.0–46.0)
Hemoglobin: 9.3 g/dL — ABNORMAL LOW (ref 12.0–15.0)
MCH: 29.4 pg (ref 26.0–34.0)
MCHC: 35.1 g/dL (ref 30.0–36.0)
MCV: 83.9 fL (ref 80.0–100.0)
Platelets: 225 10*3/uL (ref 150–400)
RBC: 3.16 MIL/uL — ABNORMAL LOW (ref 3.87–5.11)
RDW: 15.5 % (ref 11.5–15.5)
WBC: 15.7 10*3/uL — ABNORMAL HIGH (ref 4.0–10.5)
nRBC: 0 % (ref 0.0–0.2)

## 2019-04-19 LAB — COMPREHENSIVE METABOLIC PANEL
ALT: 76 U/L — ABNORMAL HIGH (ref 0–44)
AST: 60 U/L — ABNORMAL HIGH (ref 15–41)
Albumin: 1.9 g/dL — ABNORMAL LOW (ref 3.5–5.0)
Alkaline Phosphatase: 74 U/L (ref 38–126)
Anion gap: 8 (ref 5–15)
BUN: <5 mg/dL — ABNORMAL LOW (ref 6–20)
CO2: 22 mmol/L (ref 22–32)
Calcium: 7 mg/dL — ABNORMAL LOW (ref 8.9–10.3)
Chloride: 103 mmol/L (ref 98–111)
Creatinine, Ser: 0.79 mg/dL (ref 0.44–1.00)
GFR calc Af Amer: >60 mL/min (ref >60)
GFR calc non Af Amer: >60 mL/min (ref >60)
Glucose, Bld: 126 mg/dL — ABNORMAL HIGH (ref 70–99)
Potassium: 3.4 mmol/L — ABNORMAL LOW (ref 3.5–5.1)
Sodium: 133 mmol/L — ABNORMAL LOW (ref 135–145)
Total Bilirubin: 3.2 mg/dL — ABNORMAL HIGH (ref 0.3–1.2)
Total Protein: 5.4 g/dL — ABNORMAL LOW (ref 6.5–8.1)

## 2019-04-19 LAB — AMYLASE, PLEURAL OR PERITONEAL FLUID: Amylase, Fluid: 419 U/L

## 2019-04-19 MED ORDER — ENOXAPARIN SODIUM 40 MG/0.4ML ~~LOC~~ SOLN: 40.0000 mg | SUBCUTANEOUS | Status: DC

## 2019-04-19 NOTE — Progress Notes (Signed)
Anesthesiology Epidural Follow-up:  Awake and alert, pain control adequate with epidural ropivacaine at 8 cc/hr. Able to walk and bear weight with assistance yesterday.  VS: T- 37.1 BP- 106/69 HR- 83 RR- 20 O2 Sat 98% on RA K- 3.4 BUN/Cr.- 5/0.79 H/H- 9.3/26.5 Platelets- 225,000  Pain control adequate with epidural ropivacaine 0.2% at 8 cc/hr. Will continue today, consider d/cing catheter in next 1-2 days.  Kathryn Stevenson

## 2019-04-19 NOTE — Addendum Note (Signed)
Addendum  created 04/19/19 1414 by Roberts Gaudy, MD   Clinical Note Signed

## 2019-04-19 NOTE — Progress Notes (Addendum)
[facility] Office:  [phone] General Surgery Progress Note   LOS: 4 days  POD -  4 Days Post-Op  Assessment and Plan: 1.  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  Leukocytosis  WBC - 15,700 - 04/19/2019  On zosyn and Vanc 3.  Anemia   Hgb - 9.3 - 04/19/2019 4.  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  Otherwise comfortable.  Objective:   Vitals:   04/19/19 0500 04/19/19 0600  BP: 113/65 (!) 147/94  Pulse:    Resp: (!) 22   Temp:    SpO2:       Intake/Output from previous day:  10/30 0701 - 10/31 0700 In: 3054.2 [I.V.:1521.8; IV Piggyback:1348.3] Out: 1990 [Urine:1235; Drains:755]  Intake/Output this shift:  No intake/output data recorded.   Physical Exam:   General: Obese AA F who is alert and oriented.    HEENT: Normal. Pupils equal. .   Lungs: Clear.  IS = 750 cc   Abdomen: Soft   Wound: With [facility] Drains - medial/lateral - 545/210 cc last 24 hours    Lab Results:    Recent Labs    04/18/19 0444 04/19/19 0402  WBC 16.8* 15.7*  HGB 9.8* 9.3*  HCT 27.5* 26.5*  PLT 206 225    BMET   Recent Labs    04/18/19 0444 04/19/19 0402  NA 133* 133*  K 3.4* 3.4*  CL 102 103  CO2 22 22  GLUCOSE 106* 126*  BUN 5* <5*  CREATININE 0.84 0.79  CALCIUM 7.1* 7.0*    PT/INR   Recent Labs    04/16/19 0847  LABPROT 15.8*  INR 1.3*    ABG  No results for input(s): PHART, HCO3 in the last 72 hours.  Invalid input(s): PCO2, PO2   Studies/Results:  Dg Chest Port 1 View  Result Date: 04/17/2019 CLINICAL DATA:  43 year old female with fever. EXAM: PORTABLE CHEST 1 VIEW COMPARISON:  Chest radiograph  dated 04/15/2019 FINDINGS: Interval removal of the previously seen enteric tube. Minimal hazy density at the left lung base may represent atelectasis. A small left pleural effusion is not excluded. The right lung is clear. There is no pneumothorax. Left IJ central venous line with tip over SVC at the level of left innominate vein in similar position. Stable mild cardiomegaly. No acute osseous pathology. IMPRESSION: 1. Minimal hazy density at the left lung base likely atelectasis. A small left pleural effusion is less likely. 2. Stable mild cardiomegaly. 3. Interval removal of the enteric tube. The left IJ line is in similar position. Electronically Signed   By: Anner Crete M.D.   On: 04/17/2019 19:25     Anti-infectives:   Anti-infectives (From admission, onward)   Start     Dose/Rate Route Frequency Ordered Stop   04/17/19 1830  vancomycin (VANCOCIN) 1,500 mg in sodium chloride 0.9 % 500 mL IVPB     1,500 mg 250 mL/hr over 120 Minutes Intravenous Every 12 hours 04/17/19 1704     04/17/19 1800  piperacillin-tazobactam (ZOSYN) IVPB 3.375 g     3.375 g 12.5 mL/hr over 240 Minutes Intravenous Every 8 hours  04/17/19 1704     04/15/19 2200  ceFAZolin (ANCEF) IVPB 2g/100 mL premix     2 g 200 mL/hr over 30 Minutes Intravenous Every 8 hours 04/15/19 1628 04/16/19 0007   04/15/19 0600  ceFAZolin (ANCEF) 3 g in dextrose 5 % 50 mL IVPB     3 g 100 mL/hr over 30 Minutes Intravenous On call to O.R. 04/14/19 0711 04/15/19 [place], MD, [facility] Surgery Office: [phone] 04/19/2019

## 2019-04-19 NOTE — Progress Notes (Signed)
Pt having increased drainage from both JP drains.  Output previously 10-109mL for each drain per shift, but has increased to 200 and 451mL.  Pt also having multiple loose liquid stools.  Dr. Kieth Brightly, surgery, on call notified.  Surgery to round in the AM to assess the patient closer.  Advised to monitor for now.

## 2019-04-20 LAB — CBC WITH DIFFERENTIAL/PLATELET
Abs Immature Granulocytes: 0.08 10*3/uL — ABNORMAL HIGH (ref 0.00–0.07)
Basophils Absolute: 0 10*3/uL (ref 0.0–0.1)
Basophils Relative: 0 %
Eosinophils Absolute: 0.2 10*3/uL (ref 0.0–0.5)
Eosinophils Relative: 1 %
HCT: 24.8 % — ABNORMAL LOW (ref 36.0–46.0)
Hemoglobin: 8.9 g/dL — ABNORMAL LOW (ref 12.0–15.0)
Immature Granulocytes: 1 %
Lymphocytes Relative: 15 %
Lymphs Abs: 1.8 10*3/uL (ref 0.7–4.0)
MCH: 29.2 pg (ref 26.0–34.0)
MCHC: 35.9 g/dL (ref 30.0–36.0)
MCV: 81.3 fL (ref 80.0–100.0)
Monocytes Absolute: 1.2 10*3/uL — ABNORMAL HIGH (ref 0.1–1.0)
Monocytes Relative: 10 %
Neutro Abs: 8.9 10*3/uL — ABNORMAL HIGH (ref 1.7–7.7)
Neutrophils Relative %: 73 %
Platelets: 249 10*3/uL (ref 150–400)
RBC: 3.05 MIL/uL — ABNORMAL LOW (ref 3.87–5.11)
RDW: 15.1 % (ref 11.5–15.5)
WBC: 12.2 10*3/uL — ABNORMAL HIGH (ref 4.0–10.5)
nRBC: 0 % (ref 0.0–0.2)

## 2019-04-20 LAB — COMPREHENSIVE METABOLIC PANEL
ALT: 72 U/L — ABNORMAL HIGH (ref 0–44)
AST: 81 U/L — ABNORMAL HIGH (ref 15–41)
Albumin: 1.8 g/dL — ABNORMAL LOW (ref 3.5–5.0)
Alkaline Phosphatase: 100 U/L (ref 38–126)
Anion gap: 6 (ref 5–15)
BUN: <5 mg/dL — ABNORMAL LOW (ref 6–20)
CO2: 23 mmol/L (ref 22–32)
Calcium: 7.3 mg/dL — ABNORMAL LOW (ref 8.9–10.3)
Chloride: 107 mmol/L (ref 98–111)
Creatinine, Ser: 0.74 mg/dL (ref 0.44–1.00)
GFR calc Af Amer: >60 mL/min (ref >60)
GFR calc non Af Amer: >60 mL/min (ref >60)
Glucose, Bld: 109 mg/dL — ABNORMAL HIGH (ref 70–99)
Potassium: 3.3 mmol/L — ABNORMAL LOW (ref 3.5–5.1)
Sodium: 136 mmol/L (ref 135–145)
Total Bilirubin: 2.4 mg/dL — ABNORMAL HIGH (ref 0.3–1.2)
Total Protein: 5.4 g/dL — ABNORMAL LOW (ref 6.5–8.1)

## 2019-04-20 MED ORDER — POTASSIUM CHLORIDE CRYS ER 20 MEQ PO TBCR
40.0000 meq | EXTENDED_RELEASE_TABLET | Freq: Once | ORAL | Status: AC
  Administered 2019-04-20: 40 meq via ORAL
  Filled 2019-04-20: qty 2

## 2019-04-20 NOTE — Progress Notes (Signed)
Pt arrived to [facility] room 25, alert and oriented x 4, able to walk with standby assisted to her bed,  no acute distress noted. CHG bath given, call bell within reach,CCMD called and 2nd person verified EKG was NSR on monitor., BP stable. On PCA pump Hydromorphone through central line left IJ and Epidural Ropivacaine at 8 ml/hr. Her pain controlled well.  Complained having some itching, Benadryl given. Wound vac dressing intact, Right upper quadrant drainaige bag x 2 with moderate serosanguinous drainage. Had Foley catheter, Foley care given. Pt had loose stool at arrival x 1.  Continue to monitor.  [place]eya [id number]

## 2019-04-20 NOTE — Progress Notes (Signed)
Foley catheter not removed due to previous order to leave in place as long as eipdural is infusing.

## 2019-04-20 NOTE — Anesthesia Post-op Follow-up Note (Signed)
  Anesthesia Pain Follow-up Note  Patient: Kathryn Stevenson  Day #: 5  Date of Follow-up: 04/20/2019 Time: 1:07 PM  Last Vitals:  Vitals:   04/20/19 1106 04/20/19 1132  BP:  136/81  Pulse:  76  Resp: 18 (!) 21  Temp:  37.2 C  SpO2: 96% 99%    Level of Consciousness: alert  Pain: none   Side Effects:None  Catheter Site Exam:clean  Epidural / Intrathecal (From admission, onward)   Start     Dose/Rate Route Frequency Ordered Stop   04/17/19 0900  ropivacaine (PF) 2 mg/mL (0.2%) (NAROPIN) injection     8 mL/hr 8 mL/hr  Epidural Continuous 04/17/19 0854         Plan: Probable D/C tomorrow.  [place]

## 2019-04-20 NOTE — Progress Notes (Addendum)
[facility] Office:  [phone] General Surgery Progress Note   LOS: 5 days  POD -  5 Days Post-Op  Assessment and Plan: 1.  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  Leukocytosis  WBC - 12,200 - 04/20/2019  On zosyn and Vanc 3.  Anemia   Hgb - 8.9 - 04/20/2019 4.  DVT prophylaxis - on hold until epidural out 5.  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  Seems to be doing well.  Epidural appears to be providing good pain control.  Objective:   Vitals:   04/20/19 0319 04/20/19 0335  BP: 120/75   Pulse: 73   Resp: 20 (!) 22  Temp:    SpO2: 100% 98%     Intake/Output from previous day:  10/31 0701 - 11/01 0700 In: 3142.1 [P.O.:250; I.V.:2036.3; IV Piggyback:715.6] Out: 2275 [Urine:1700; Drains:575]  Intake/Output this shift:  No intake/output data recorded.   Physical Exam:   General: Obese AA F who is alert and oriented.    HEENT: Normal. Pupils equal. .   Lungs: Clear.     Abdomen: Soft   Wound: With VAC.  Has BS. Drains - medial/lateral - 295/280 cc last 24 hours    Lab Results:    Recent Labs    04/19/19 0402 04/20/19 0350  WBC 15.7* 12.2*  HGB 9.3* 8.9*  HCT 26.5* 24.8*  PLT 225 249    BMET   Recent Labs    04/19/19 0402 04/20/19 0350  NA 133* 136  K 3.4* 3.3*  CL 103 107  CO2 22 23  GLUCOSE 126* 109*  BUN <5* <5*  CREATININE 0.79 0.74  CALCIUM 7.0* 7.3*    PT/INR   No results for input(s): LABPROT, INR in the last 72 hours.  ABG  No results for input(s): PHART, HCO3 in the last 72 hours.  Invalid input(s): PCO2, PO2   Studies/Results:  No results found.   Anti-infectives:   Anti-infectives (From admission,  onward)   Start     Dose/Rate Route Frequency Ordered Stop   04/17/19 1830  vancomycin (VANCOCIN) 1,500 mg in sodium chloride 0.9 % 500 mL IVPB     1,500 mg 250 mL/hr over 120 Minutes Intravenous Every 12 hours 04/17/19 1704     04/17/19 1800  piperacillin-tazobactam (ZOSYN) IVPB 3.375 g     3.375 g 12.5 mL/hr over 240 Minutes Intravenous Every 8 hours 04/17/19 1704     04/15/19 2200  ceFAZolin (ANCEF) IVPB 2g/100 mL premix     2 g 200 mL/hr over 30 Minutes Intravenous Every 8 hours 04/15/19 1628 04/16/19 0007   04/15/19 0600  ceFAZolin (ANCEF) 3 g in dextrose 5 % 50 mL IVPB     3 g 100 mL/hr over 30 Minutes Intravenous On call to O.R. 04/14/19 0711 04/15/19 [place], MD, [facility] Office: [phone] 04/20/2019

## 2019-04-21 LAB — COMPREHENSIVE METABOLIC PANEL
ALT: 81 U/L — ABNORMAL HIGH (ref 0–44)
AST: 97 U/L — ABNORMAL HIGH (ref 15–41)
Albumin: 1.9 g/dL — ABNORMAL LOW (ref 3.5–5.0)
Alkaline Phosphatase: 130 U/L — ABNORMAL HIGH (ref 38–126)
Anion gap: 7 (ref 5–15)
BUN: <5 mg/dL — ABNORMAL LOW (ref 6–20)
CO2: 22 mmol/L (ref 22–32)
Calcium: 7.6 mg/dL — ABNORMAL LOW (ref 8.9–10.3)
Chloride: 107 mmol/L (ref 98–111)
Creatinine, Ser: 0.74 mg/dL (ref 0.44–1.00)
GFR calc Af Amer: >60 mL/min (ref >60)
GFR calc non Af Amer: >60 mL/min (ref >60)
Glucose, Bld: 124 mg/dL — ABNORMAL HIGH (ref 70–99)
Potassium: 3.7 mmol/L (ref 3.5–5.1)
Sodium: 136 mmol/L (ref 135–145)
Total Bilirubin: 1.9 mg/dL — ABNORMAL HIGH (ref 0.3–1.2)
Total Protein: 5.5 g/dL — ABNORMAL LOW (ref 6.5–8.1)

## 2019-04-21 LAB — CBC WITH DIFFERENTIAL/PLATELET
Abs Immature Granulocytes: 0.21 10*3/uL — ABNORMAL HIGH (ref 0.00–0.07)
Basophils Absolute: 0 10*3/uL (ref 0.0–0.1)
Basophils Relative: 0 %
Eosinophils Absolute: 0.1 10*3/uL (ref 0.0–0.5)
Eosinophils Relative: 1 %
HCT: 24.8 % — ABNORMAL LOW (ref 36.0–46.0)
Hemoglobin: 8.9 g/dL — ABNORMAL LOW (ref 12.0–15.0)
Immature Granulocytes: 2 %
Lymphocytes Relative: 13 %
Lymphs Abs: 1.6 10*3/uL (ref 0.7–4.0)
MCH: 29.3 pg (ref 26.0–34.0)
MCHC: 35.9 g/dL (ref 30.0–36.0)
MCV: 81.6 fL (ref 80.0–100.0)
Monocytes Absolute: 1.1 10*3/uL — ABNORMAL HIGH (ref 0.1–1.0)
Monocytes Relative: 9 %
Neutro Abs: 8.9 10*3/uL — ABNORMAL HIGH (ref 1.7–7.7)
Neutrophils Relative %: 75 %
Platelets: 303 10*3/uL (ref 150–400)
RBC: 3.04 MIL/uL — ABNORMAL LOW (ref 3.87–5.11)
RDW: 15.4 % (ref 11.5–15.5)
WBC: 11.9 10*3/uL — ABNORMAL HIGH (ref 4.0–10.5)
nRBC: 0.3 % — ABNORMAL HIGH (ref 0.0–0.2)

## 2019-04-21 LAB — CHROMOGRANIN A: Chromogranin A (ng/mL): 39.7 ng/mL (ref 0.0–101.8)

## 2019-04-21 LAB — SURGICAL PATHOLOGY

## 2019-04-21 MED ORDER — HYDROMORPHONE HCL 1 MG/ML IJ SOLN: 0.5000 mg | INTRAMUSCULAR | Status: DC | PRN

## 2019-04-21 MED ORDER — OXYCODONE HCL 5 MG PO TABS
5.0000 mg | ORAL_TABLET | ORAL | Status: DC | PRN
  Administered 2019-04-21 – 2019-04-22 (×6): 10 mg via ORAL
  Filled 2019-04-21 (×6): qty 2

## 2019-04-21 MED ORDER — NAPROXEN SODIUM 275 MG PO TABS
275.0000 mg | ORAL_TABLET | Freq: Two times a day (BID) | ORAL | Status: DC
  Administered 2019-04-21 – 2019-04-23 (×4): 275 mg via ORAL
  Filled 2019-04-21 (×4): qty 1

## 2019-04-21 MED ORDER — METHOCARBAMOL 500 MG PO TABS
500.0000 mg | ORAL_TABLET | Freq: Four times a day (QID) | ORAL | Status: DC | PRN
  Administered 2019-04-21 – 2019-04-22 (×4): 500 mg via ORAL
  Filled 2019-04-21 (×4): qty 1

## 2019-04-21 NOTE — Progress Notes (Addendum)
Pt appeared pleasant and alert. Requested staff please do not disturb because she needed to sleep more tonight. PCA pump was beeping several times due to Pt took the ETCO2 cable out from her nose. Pt agreed to put it back on to monitor ETCO2 after education given.  Her vital remained stable, remained afebrile. Pain controlled well with PCA and Epidural Ropivacaine 8 ml/hr. She was able to ambulated in the room with one staff assisted. Wound vac sealed intact, no drainage in canister.  JP drainage appeared with serosanguinous, moderated amount 345 ml in 24 hours. Foley catheter still on due to Epidural infusion per order.  Continue to monitor.    04/21/19 0230  Vitals  Temp 99.4 F (37.4 C)  Temp Source Oral  BP 119/68  MAP (mmHg) 82  BP Location Right Wrist  BP Method Manual  Patient Position (if appropriate) Lying  Pulse Rate 76  Pulse Rate Source Monitor  ECG Heart Rate 76  Cardiac Rhythm NSR  Resp 20  Oxygen Therapy  SpO2 100 %  O2 Device Room Air  Pain Assessment  Pain Scale 0-10  Pain Score Asleep  Glasgow Coma Scale  Eye Opening 4  Best Verbal Response (NON-intubated) 5  Best Motor Response 6  Glasgow Coma Scale Score 15  MEWS Score  MEWS RR 0  MEWS Pulse 0  MEWS Systolic 0  MEWS LOC 0  MEWS Temp 0  MEWS Score 0  MEWS Score Color Green  MEWS Assessment  Is this an acute change? No    Kennyth Lose, RN

## 2019-04-21 NOTE — Anesthesia Post-op Follow-up Note (Signed)
  Anesthesia Pain Follow-up Note  Patient: Kathryn Stevenson  Day #: 6  Date of Follow-up: 04/21/2019 Time: 10:21 AM  Last Vitals:  Vitals:   04/21/19 0419 04/21/19 0800  BP: 117/71   Pulse: 72   Resp: 18 18  Temp: 36.8 C   SpO2: 100% 100%    Level of Consciousness: alert  Pain: mild   Side Effects:None  Catheter Site Exam: Clean, dry, intact. Non-tender.  Epidural / Intrathecal (From admission, onward)   Start     Dose/Rate Route Frequency Ordered Stop   04/17/19 0900  ropivacaine (PF) 2 mg/mL (0.2%) (NAROPIN) injection     8 mL/hr 8 mL/hr  Epidural Continuous 04/17/19 0854         Plan: Catheter removed/tip intact at surgeon's request  Audry Pili

## 2019-04-21 NOTE — Progress Notes (Signed)
Pharmacy Antibiotic Note  Kathryn Stevenson is a 43 y.o. female on day # 5 empiric Vancomycin and Zosyn, begun 2 days post-op [place] for possible pancreatic leak.    Tmax 100.1 last night, now afebrile. WBC trending down, renal function stable.  Plan: Continue Zosyn 3.375 gm IV q8h (each over 4 hrs) Continue Vancomycin 1500 mg IV q12h Goal AUC 400-550. Expected AUC: 500 SCr used: 0.8 Will check Vanc levels this week, if vanc to continue more that a few more days.   Height: 5\' 6"  (167.6 cm) Weight: (!) 356 lb 7.7 oz (161.7 kg) IBW/kg (Calculated) : 59.3  Temp (24hrs), Avg:98.8 F (37.1 C), Min:97.8 F (36.6 C), Max:100.1 F (37.8 C)  Recent Labs  Lab 04/17/19 0534 04/17/19 1030 04/18/19 0444 04/19/19 0402 04/20/19 0350 04/21/19 0435  WBC 13.9*  --  16.8* 15.7* 12.2* 11.9*  CREATININE  --  0.73 0.84 0.79 0.74 0.74    Estimated Creatinine Clearance: 142.1 mL/min (by C-G formula based on SCr of 0.74 mg/dL).    No Known Allergies  Antimicrobials this admission:  Vancomycin 10/29 >>  Zosyn 10/29 >>  Dose adjustments this admission:  n/a  Microbiology results: 10/29 urine: negative 10/27 MRSA PCR>>positive > CHG daily, Bactroban 10/28>>11/1  Thank you for allowing pharmacy to be a part of this patient's care.  Arty Baumgartner, [place] Pager: [phone] or phone: [phone] 04/21/2019 3:50 PM

## 2019-04-21 NOTE — Progress Notes (Signed)
Inpatient Rehab Admissions Coordinator:   Met with pt at bedside.  Reports d/c epidural today and planning to attempt transition to PO pain meds today.  Will f/u tomorrow for possible admission pending bed availability and medical readiness.   Shann Medal, PT, DPT Admissions Coordinator [phone] 04/21/19  1:05 PM

## 2019-04-21 NOTE — Progress Notes (Signed)
Physical Therapy Treatment Patient Details Name: Kathryn Stevenson MRN: [id number] DOB: 07/12/1975 Today's Date: 04/21/2019    History of Present Illness pt is a 43 y/o female presenting with a pancreatic mass.  10/27 s/p classic pancreaticoduodenectomy and placement of biliary and pancreatic stents. Post op central incisional wound VAC, R LQ drains x 2, epidural for pain management and PCA pump.  PMH:  Hep B, pancreatic/liver lesions.    PT Comments    Patient progressing slowly towards PT goals. Limited today mainly due to menstrual cramps and pain despite being pre medicated. Tolerated standing and SPT bed to/from [facility] with Min guard assist for balance/safety/lines. Pt needed assist for pericare. Reports she is getting more feeling in BLEs after epidural was turned off. Will plan for gait training next session as tolerated. Will follow.    Follow Up Recommendations  CIR;Supervision/Assistance - 24 hour     Equipment Recommendations  None recommended by PT    Recommendations for Other Services       Precautions / Restrictions Precautions Precautions: Fall Precaution Comments: stopped epidural 11/2- getting sensation back into legs; 2 drains, wound vac, PCA Restrictions Weight Bearing Restrictions: No    Mobility  Bed Mobility Overal bed mobility: Needs Assistance Bed Mobility: Rolling;Sidelying to Sit Rolling: Min guard Sidelying to sit: Min guard;HOB elevated       General bed mobility comments: Use of rail and HOB elevated, rolling towards left side and able to sit up without assist. INcreased time, assist to manage lines.  Transfers Overall transfer level: Needs assistance Equipment used: None Transfers: Sit to/from Stand Sit to Stand: Min guard Stand pivot transfers: Min guard       General transfer comment: Min guard for safety. Stood from [employer]. SPT bed to/from [facility] with Min guard assist, managing lines. Decline further ambulation due to painful menstrual  cramps.  Ambulation/Gait                 Stairs             Wheelchair Mobility    Modified Rankin (Stroke Patients Only)       Balance Overall balance assessment: Needs assistance Sitting-balance support: Feet supported;No upper extremity supported Sitting balance-Leahy Scale: Fair     Standing balance support: During functional activity Standing balance-Leahy Scale: Poor Standing balance comment: Requires UE support for attempted pericare; needed total assist as pt could not reach; needs UE support for dynamic balance.                            Cognition Arousal/Alertness: Awake/alert Behavior During Therapy: WFL for tasks assessed/performed Overall Cognitive Status: Within Functional Limits for tasks assessed                                        Exercises      General Comments General comments (skin integrity, edema, etc.): VSS.      Pertinent Vitals/Pain Pain Assessment: Faces Faces Pain Scale: Hurts whole lot Pain Location: menstrual cramps Pain Descriptors / Indicators: Grimacing;Guarding;Discomfort;Sore Pain Intervention(s): Repositioned;Monitored during session;Limited activity within patient's tolerance    Home Living                      Prior Function            PT Goals (current goals can now  be found in the care plan section) Progress towards PT goals: Progressing toward goals(slowly)    Frequency    Min 3X/week      PT Plan Current plan remains appropriate    Co-evaluation              AM-PAC PT "6 Clicks" Mobility   Outcome Measure  Help needed turning from your back to your side while in a flat bed without using bedrails?: None Help needed moving from lying on your back to sitting on the side of a flat bed without using bedrails?: A Little Help needed moving to and from a bed to a chair (including a wheelchair)?: A Little Help needed standing up from a chair using your  arms (e.g., wheelchair or bedside chair)?: A Little Help needed to walk in hospital room?: A Little Help needed climbing 3-5 steps with a railing? : A Lot 6 Click Score: 18    End of Session   Activity Tolerance: Patient limited by pain Patient left: in bed;with call bell/phone within reach(sitting EOB.) Nurse Communication: Mobility status;Other (comment)(RN notified about pt wanting to turn off pCA pump) PT Visit Diagnosis: Muscle weakness (generalized) (M62.81);Difficulty in walking, not elsewhere classified (R26.2);Pain;Other abnormalities of gait and mobility (R26.89) Pain - part of body: (abdomen)     Time: 1333-1400 PT Time Calculation (min) (ACUTE ONLY): 27 min  Charges:  $Therapeutic Activity: 23-37 mins                     Marisa Severin, PT, DPT Acute Rehabilitation Services Pager [phone] Office [phone]       Marguarite Arbour A Sabra Heck 04/21/2019, 2:14 PM

## 2019-04-21 NOTE — Progress Notes (Signed)
6 Days Post-Op   Subjective/Chief Complaint: Temp curve down.  Transferred to stepdown.  Having some loose stools.   Objective: Vital signs in last 24 hours: Temp:  [97.8 F (36.6 C)-100.1 F (37.8 C)] 98.2 F (36.8 C) (11/02 0419) Pulse Rate:  [72-83] 72 (11/02 0419) Resp:  [18-26] 18 (11/02 0800) BP: (90-136)/(53-81) 117/71 (11/02 0419) SpO2:  [96 %-100 %] 100 % (11/02 0800) Last BM Date: 04/20/19  Intake/Output from previous day: 11/01 0701 - 11/02 0700 In: 3720.9 [P.O.:1090; I.V.:1757.2; IV Piggyback:637.3] Out: 2070 [Urine:1725; Drains:345] Intake/Output this shift: No intake/output data recorded.  General appearance: alert, cooperative and no distress Resp: breathing comfortably Cardio: regular rate and rhythm GI: soft appropriately tender.  provena wound vac in place.   Extremities: extremities normal, atraumatic, no cyanosis or edema One drain sl murky.   Lab Results:  Recent Labs    04/20/19 0350 04/21/19 0435  WBC 12.2* 11.9*  HGB 8.9* 8.9*  HCT 24.8* 24.8*  PLT 249 303   BMET Recent Labs    04/20/19 0350 04/21/19 0435  NA 136 136  K 3.3* 3.7  CL 107 107  CO2 23 22  GLUCOSE 109* 124*  BUN <5* <5*  CREATININE 0.74 0.74  CALCIUM 7.3* 7.6*   PT/INR No results for input(s): LABPROT, INR in the last 72 hours. ABG No results for input(s): PHART, HCO3 in the last 72 hours.  Invalid input(s): PCO2, PO2  Studies/Results: No results found.  Anti-infectives: Anti-infectives (From admission, onward)   Start     Dose/Rate Route Frequency Ordered Stop   04/17/19 1830  vancomycin (VANCOCIN) 1,500 mg in sodium chloride 0.9 % 500 mL IVPB     1,500 mg 250 mL/hr over 120 Minutes Intravenous Every 12 hours 04/17/19 1704     04/17/19 1800  piperacillin-tazobactam (ZOSYN) IVPB 3.375 g     3.375 g 12.5 mL/hr over 240 Minutes Intravenous Every 8 hours 04/17/19 1704     04/15/19 2200  ceFAZolin (ANCEF) IVPB 2g/100 mL premix     2 g 200 mL/hr over 30  Minutes Intravenous Every 8 hours 04/15/19 1628 04/16/19 0007   04/15/19 0600  ceFAZolin (ANCEF) 3 g in dextrose 5 % 50 mL IVPB     3 g 100 mL/hr over 30 Minutes Intravenous On call to O.R. 04/14/19 0711 04/15/19 1427      Assessment/Plan: s/p Procedure(s) with comments: WHIPPLE PROCEDURE (N/A) - EPIDURAL Path solid pseudopapillary tumor.    D/c epidural Then d/c foley Add oxycodone. Soft diet.  Probably to floor tomorrow.    PT. Pulmonary toilet. ARBF.  Presumed low grade panc leak.  On vanc/zosyn.      LOS: 6 days    Stark Klein 04/21/2019

## 2019-04-22 LAB — BPAM RBC
Blood Product Expiration Date: 202011222359
Blood Product Expiration Date: 202011222359
Blood Product Expiration Date: 202011222359
Blood Product Expiration Date: 202011222359
ISSUE DATE / TIME: 202011032051
ISSUE DATE / TIME: 202011032051
Unit Type and Rh: 7300
Unit Type and Rh: 7300
Unit Type and Rh: 7300
Unit Type and Rh: 7300

## 2019-04-22 LAB — TYPE AND SCREEN
ABO/RH(D): B POS
Antibody Screen: NEGATIVE
Unit division: 0
Unit division: 0
Unit division: 0
Unit division: 0

## 2019-04-22 LAB — COMPREHENSIVE METABOLIC PANEL
ALT: 89 U/L — ABNORMAL HIGH (ref 0–44)
AST: 111 U/L — ABNORMAL HIGH (ref 15–41)
Albumin: 1.9 g/dL — ABNORMAL LOW (ref 3.5–5.0)
Alkaline Phosphatase: 156 U/L — ABNORMAL HIGH (ref 38–126)
Anion gap: 12 (ref 5–15)
BUN: <5 mg/dL — ABNORMAL LOW (ref 6–20)
CO2: 21 mmol/L — ABNORMAL LOW (ref 22–32)
Calcium: 7.9 mg/dL — ABNORMAL LOW (ref 8.9–10.3)
Chloride: 104 mmol/L (ref 98–111)
Creatinine, Ser: 0.76 mg/dL (ref 0.44–1.00)
GFR calc Af Amer: >60 mL/min (ref >60)
GFR calc non Af Amer: >60 mL/min (ref >60)
Glucose, Bld: 110 mg/dL — ABNORMAL HIGH (ref 70–99)
Potassium: 3.5 mmol/L (ref 3.5–5.1)
Sodium: 137 mmol/L (ref 135–145)
Total Bilirubin: 2.1 mg/dL — ABNORMAL HIGH (ref 0.3–1.2)
Total Protein: 5.9 g/dL — ABNORMAL LOW (ref 6.5–8.1)

## 2019-04-22 MED ORDER — AMOXICILLIN-POT CLAVULANATE 875-125 MG PO TABS: 1.0000 | ORAL_TABLET | Freq: Two times a day (BID) | ORAL | 0 refills | Status: AC

## 2019-04-22 MED ORDER — PANTOPRAZOLE SODIUM 40 MG PO TBEC
40.0000 mg | DELAYED_RELEASE_TABLET | Freq: Every day | ORAL | Status: DC
  Administered 2019-04-22: 40 mg via ORAL
  Filled 2019-04-22: qty 1

## 2019-04-22 MED ORDER — OXYCODONE HCL 5 MG PO TABS: 5.0000 mg | ORAL_TABLET | Freq: Four times a day (QID) | ORAL | 0 refills | Status: DC | PRN

## 2019-04-22 NOTE — Discharge Instructions (Signed)
[facility]      [facility] °[phone] ° °ABDOMINAL SURGERY: POST OP INSTRUCTIONS ° °Always review your discharge instruction sheet given to you by the facility where your surgery was performed. ° °IF YOU HAVE DISABILITY OR FAMILY LEAVE FORMS, YOU MUST BRING THEM TO THE OFFICE FOR PROCESSING.  PLEASE DO NOT GIVE THEM TO YOUR DOCTOR. ° °1. A prescription for pain medication may be given to you upon discharge.  Take your pain medication as prescribed, if needed.  If narcotic pain medicine is not needed, then you may take acetaminophen (Tylenol) or ibuprofen (Advil) as needed. °2. Take your usually prescribed medications unless otherwise directed. °3. If you need a refill on your pain medication, please contact your pharmacy. They will contact our office to request authorization.  Prescriptions will not be filled after 5pm or on week-ends. °4. You should follow a light diet the first few days after arrival home, such as soup and crackers, pudding, etc.unless your doctor has advised otherwise. A high-fiber, low fat diet can be resumed as tolerated.   Be sure to include lots of fluids daily. Most patients will experience some swelling and bruising on the chest and neck area.  Ice packs will help.  Swelling and bruising can take several days to resolve °5. Most patients will experience some swelling and bruising in the area of the incision. Ice pack will help. Swelling and bruising can take several days to resolve..  °6. It is common to experience some constipation if taking pain medication after surgery.  Increasing fluid intake and taking a stool softener will usually help or prevent this problem from occurring.  A mild laxative (Milk of Magnesia or Miralax) should be taken according to package directions if there are no bowel movements after 48 hours. °7.  You may have steri-strips (small skin tapes) in place directly over the incision.  These strips should be left on the skin for 10-14 days.  If your  surgeon used skin glue on the incision, you may shower in 48 hours.  The glue will flake off over the next 2-3 weeks.  Any sutures or staples will be removed at the office during your follow-up visit. You may find that a light gauze bandage over your incision may keep your staples from being rubbed or pulled. You may shower and replace the bandage daily. °8. ACTIVITIES:  You may resume regular (light) daily activities beginning the next day--such as daily self-care, walking, climbing stairs--gradually increasing activities as tolerated.  You may have sexual intercourse when it is comfortable.  Refrain from any heavy lifting or straining until approved by your doctor. °a. You may drive when you no longer are taking prescription pain medication, you can comfortably wear a seatbelt, and you can safely maneuver your car and apply brakes °b. Return to Work: __________8 weeks if applicable_________________________ °9. You should see your doctor in the office for a follow-up appointment approximately two weeks after your surgery.  Make sure that you call for this appointment within a day or two after you arrive home to insure a convenient appointment time. °OTHER INSTRUCTIONS:  °_____________________________________________________________ °_____________________________________________________________ ° °WHEN TO CALL YOUR DOCTOR: °1. Fever over 101.0 °2. Inability to urinate °3. Nausea and/or vomiting °4. Extreme swelling or bruising °5. Continued bleeding from incision. °6. Increased pain, redness, or drainage from the incision. °7. Difficulty swallowing or breathing °8. Muscle cramping or spasms. °9. Numbness or tingling in hands or feet or around lips. ° °The clinic staff is   available to answer your questions during regular business hours.  Please don’t hesitate to call and ask to speak to one of the nurses if you have concerns. ° °For further questions, please visit [email or web address] ° ° ° °Surgical Drain  Home Care °Surgical drains are used to remove extra fluid that normally builds up in a surgical wound after surgery. A surgical drain helps to heal a surgical wound. Different kinds of surgical drains include: °· Active drains. These drains use suction to pull drainage away from the surgical wound. Drainage flows through a tube to a container outside of the body. With these drains, you need to keep the bulb or the drainage container flat (compressed) at all times, except while you empty it. Flattening the bulb or container creates suction. °· Passive drains. These drains allow fluid to drain naturally, by gravity. Drainage flows through a tube to a bandage (dressing) or a container outside of the body. Passive drains do not need to be emptied. °A drain is placed during surgery. Right after surgery, drainage is usually bright red and a little thicker than water. The drainage may gradually turn yellow or pink and become thinner. It is likely that your health care provider will remove the drain when the drainage stops or when the amount decreases to 1-2 Tbsp (15-30 mL) during a 24-hour period. °Supplies needed: °· Tape. °· Germ-free cleaning solution (sterile saline). °· Cotton swabs. °· Split gauze drain sponge: 4 x 4 inches (10 x 10 cm). °· Gauze square: 4 x 4 inches (10 x 10 cm). °How to care for your surgical drain °Care for your drain as told by your health care provider. This is important to help prevent infection. If your drain is placed at your back, or any other hard-to-reach area, ask another person to assist you in performing the following tasks: °General care °· Keep the skin around the drain dry and covered with a dressing at all times. °· Check your drain area every day for signs of infection. Check for: °? Redness, swelling, or pain. °? Pus or a bad smell. °? Cloudy drainage. °? Tenderness or pressure at the drain exit site. °Changing the dressing °Follow instructions from your health care provider about  how to change your dressing. Change your dressing at least once a day. Change it more often if needed to keep the dressing dry. Make sure you: °1. Gather your supplies. °2. Wash your hands with soap and water before you change your dressing. If soap and water are not available, use hand sanitizer. °3. Remove the old dressing. Avoid using scissors to do that. °4. Wash your hands with soap and water again after removing the old dressing. °5. Use sterile saline to clean your skin around the drain. You may need to use a cotton swab to clean the skin. °6. Place the tube through the slit in a drain sponge. Place the drain sponge so that it covers your wound. °7. Place the gauze square or another drain sponge on top of the drain sponge that is on the wound. Make sure the tube is between those layers. °8. Tape the dressing to your skin. °9. Tape the drainage tube to your skin 1-2 inches (2.5-5 cm) below the place where the tube enters your body. Taping keeps the tube from pulling on any stitches (sutures) that you have. °10. Wash your hands with soap and water. °11. Write down the color of your drainage and how often you change your dressing. °How to empty   your active drain ° °1. Make sure that you have a measuring cup that you can empty your drainage into. °2. Wash your hands with soap and water. If soap and water are not available, use hand sanitizer. °3. Loosen any pins or clips that hold the tube in place. °4. If your health care provider tells you to strip the tube to prevent clots and tube blockages: °? Hold the tube at the skin with one hand. Use your other hand to pinch the tubing with your thumb and first finger. °? Gently move your fingers down the tube while squeezing very lightly. This clears any drainage, clots, or tissue from the tube. °? You may need to do this several times each day to keep the tube clear. Do not pull on the tube. °5. Open the bulb cap or the drain plug. Do not touch the inside of the cap or  the bottom of the plug. °6. Turn the device upside down and gently squeeze. °7. Empty all of the drainage into the measuring cup. °8. Compress the bulb or the container and replace the cap or the plug. To compress the bulb or the container, squeeze it firmly in the middle while you close the cap or plug the container. °9. Write down the amount of drainage that you have in each 24-hour period. If you have less than 2 Tbsp (30 mL) of drainage during 24 hours, contact your health care provider. °10. Flush the drainage down the toilet. °11. Wash your hands with soap and water. °Contact a health care provider if: °· You have redness, swelling, or pain around your drain area. °· You have pus or a bad smell coming from your drain area. °· You have a fever or chills. °· The skin around your drain is warm to the touch. °· The amount of drainage that you have is increasing instead of decreasing. °· You have drainage that is cloudy. °· There is a sudden stop or a sudden decrease in the amount of drainage that you have. °· Your drain tube falls out. °· Your active drain does not stay compressed after you empty it. °Summary °· Surgical drains are used to remove extra fluid that normally builds up in a surgical wound after surgery. °· Different kinds of surgical drains include active drains and passive drains. Active drains use suction to pull drainage away from the surgical wound, and passive drains allow fluid to drain naturally. °· It is important to care for your drain to prevent infection. If your drain is placed at your back, or any other hard-to-reach area, ask another person to assist you. °· Contact your health care provider if you have redness, swelling, or pain around your drain area. °This information is not intended to replace advice given to you by your health care provider. Make sure you discuss any questions you have with your health care provider. °Document Released: 06/02/2000 Document Revised: 07/10/2018  Document Reviewed: 07/10/2018 °Elsevier Patient Education © 2020 [employer] ° °

## 2019-04-22 NOTE — Progress Notes (Signed)
Physical Therapy Treatment Patient Details Name: Kathryn Stevenson MRN: [id number] DOB: [birth year] Today's Date: 04/22/2019    History of Present Illness pt is a 43 y/o female presenting with a pancreatic mass.  10/27 s/p classic pancreaticoduodenectomy and placement of biliary and pancreatic stents. Post op central incisional wound VAC, R LQ drains x 2, epidural for pain management and PCA pump.  PMH:  Hep B, pancreatic/liver lesions.    PT Comments    Patient progressing well towards PT goals. Reports no pain today. Tolerated gait training holding into IV pole for support initially progressing to no DME or UE support needed. 2/4 DOE noted. HR ranged from 90-112 bpm. No balance deficits noted this date. Discharge recommendation updated to home with intermittent supervision due to improvement in functional mobility. Pt reports she has a friend who will stay with her for 1 week. Will follow.    Follow Up Recommendations  No PT follow up;Supervision - Intermittent     Equipment Recommendations  None recommended by PT    Recommendations for Other Services       Precautions / Restrictions Precautions Precautions: None Precaution Comments: drains Restrictions Weight Bearing Restrictions: No    Mobility  Bed Mobility               General bed mobility comments: Up in chair upon PT arrival.  Transfers Overall transfer level: Needs assistance Equipment used: None Transfers: Sit to/from Stand Sit to Stand: Modified independent (Device/Increase time)         General transfer comment: Stood from chair without difficulty.  Ambulation/Gait Ambulation/Gait assistance: Supervision Gait Distance (Feet): 160 Feet Assistive device: None Gait Pattern/deviations: Step-through pattern;Wide base of support;Decreased stride length Gait velocity: decreased   General Gait Details: Slow, steady gait holding onto IV pole for support progressing to no UE support. 2/4 DOE.  VSS.   Stairs             Wheelchair Mobility    Modified Rankin (Stroke Patients Only)       Balance Overall balance assessment: Needs assistance Sitting-balance support: Feet supported;No upper extremity supported Sitting balance-Leahy Scale: Good     Standing balance support: During functional activity Standing balance-Leahy Scale: Fair                              Cognition Arousal/Alertness: Awake/alert Behavior During Therapy: WFL for tasks assessed/performed Overall Cognitive Status: Within Functional Limits for tasks assessed                                        Exercises      General Comments General comments (skin integrity, edema, etc.): HR ranged from 90-112 bpm.      Pertinent Vitals/Pain Pain Assessment: No/denies pain    Home Living                      Prior Function            PT Goals (current goals can now be found in the care plan section) Progress towards PT goals: Progressing toward goals    Frequency    Min 3X/week      PT Plan Discharge plan needs to be updated    Co-evaluation              AM-PAC PT "6 Clicks" Mobility  Outcome Measure  Help needed turning from your back to your side while in a flat bed without using bedrails?: None Help needed moving from lying on your back to sitting on the side of a flat bed without using bedrails?: None Help needed moving to and from a bed to a chair (including a wheelchair)?: None Help needed standing up from a chair using your arms (e.g., wheelchair or bedside chair)?: None Help needed to walk in hospital room?: None Help needed climbing 3-5 steps with a railing? : A Little 6 Click Score: 23    End of Session   Activity Tolerance: Patient tolerated treatment well Patient left: in chair;with call bell/phone within reach Nurse Communication: Mobility status PT Visit Diagnosis: Muscle weakness (generalized) (M62.81);Difficulty in  walking, not elsewhere classified (R26.2);Other abnormalities of gait and mobility (R26.89)     Time: [id number] PT Time Calculation (min) (ACUTE ONLY): 12 min  Charges:  $Gait Training: 8-22 mins                     Marisa Severin, PT, DPT Acute Rehabilitation Services Pager [phone] Office [place] 04/22/2019, 12:51 PM

## 2019-04-22 NOTE — Progress Notes (Signed)
7 Days Post-Op   Subjective/Chief Complaint: "I feel great."  Getting around well.  No nausea.  Passing gas and having stools.  No fever anymore.    Objective: Vital signs in last 24 hours: Temp:  [98.5 F (36.9 C)-98.9 F (37.2 C)] 98.5 F (36.9 C) (11/03 0436) Pulse Rate:  [69-95] 69 (11/03 0436) Resp:  [18-20] 19 (11/03 0436) BP: (112-127)/(61-87) 112/61 (11/03 0436) SpO2:  [94 %-99 %] 98 % (11/03 0436) Last BM Date: 04/21/19  Intake/Output from previous day: 11/02 0701 - 11/03 0700 In: 2015.3 [I.V.:827.1; IV Piggyback:1188.1] Out: [id number] [Urine:2000; Drains:240] Intake/Output this shift: No intake/output data recorded.  General appearance: alert, cooperative and no distress Resp: breathing comfortably Cardio: regular rate and rhythm GI: soft appropriately tender.  provena wound vac in place.   Extremities: extremities normal, atraumatic, no cyanosis or edema One drain sl murky.   Lab Results:  Recent Labs    04/20/19 0350 04/21/19 0435  WBC 12.2* 11.9*  HGB 8.9* 8.9*  HCT 24.8* 24.8*  PLT 249 303   BMET Recent Labs    04/21/19 0435 04/22/19 0238  NA 136 137  K 3.7 3.5  CL 107 104  CO2 22 21*  GLUCOSE 124* 110*  BUN <5* <5*  CREATININE 0.74 0.76  CALCIUM 7.6* 7.9*   PT/INR No results for input(s): LABPROT, INR in the last 72 hours. ABG No results for input(s): PHART, HCO3 in the last 72 hours.  Invalid input(s): PCO2, PO2  Studies/Results: No results found.  Anti-infectives: Anti-infectives (From admission, onward)   Start     Dose/Rate Route Frequency Ordered Stop   04/17/19 1830  vancomycin (VANCOCIN) 1,500 mg in sodium chloride 0.9 % 500 mL IVPB     1,500 mg 250 mL/hr over 120 Minutes Intravenous Every 12 hours 04/17/19 1704     04/17/19 1800  piperacillin-tazobactam (ZOSYN) IVPB 3.375 g     3.375 g 12.5 mL/hr over 240 Minutes Intravenous Every 8 hours 04/17/19 1704     04/15/19 2200  ceFAZolin (ANCEF) IVPB 2g/100 mL premix     2 g 200  mL/hr over 30 Minutes Intravenous Every 8 hours 04/15/19 1628 04/16/19 0007   04/15/19 0600  ceFAZolin (ANCEF) 3 g in dextrose 5 % 50 mL IVPB     3 g 100 mL/hr over 30 Minutes Intravenous On call to O.R. 04/14/19 0711 04/15/19 1427      Assessment/Plan: s/p Procedure(s) with comments: WHIPPLE PROCEDURE (N/A) - EPIDURAL Path solid pseudopapillary tumor.    oxycodone. Soft diet.  PT. Pulmonary toilet. ARBF.  Presumed low grade panc leak.  On vanc/zosyn.    Pt has improved significantly over last 36-48 hours.  She would like to go home rather than rehab.  Will make preparations for home with [facility] PT tomorrow. Will plan oral antibiotics. Will need to go home with drains.  Drain teaching today.    LOS: 7 days    Stark Klein 04/22/2019

## 2019-04-22 NOTE — Progress Notes (Signed)
Inpatient Rehabilitation-Admissions Coordinator   Pt seen this AM. While pt's insurance has approved for CIR, she feels she no longer needs IP rehab. The pt wishes to go home and not pursue CIR. AC will sign off at this time and will contact TOC team to alert them to pt preference.   Please call if questions.   Jhonnie Garner, OTR/L  Rehab Admissions Coordinator  [phone] 04/22/2019 9:57 AM

## 2019-04-22 NOTE — TOC Initial Note (Signed)
Transition of Care [facility]) - Initial/Assessment Note    Patient Details  Name: Kathryn Stevenson MRN: [id number] Date of Birth: 1975/08/14  Transition of [facility]) CM/SW Contact:    Bartholomew Crews, RN Phone Number: [phone] 04/22/2019, 10:42 AM  Clinical Narrative:                 Spoke with patient at the bedside in response to consult for home health and chat communication from [place] that patient wanted to go and no longer felt she needed inpatient rehab. PTA patient was home with significant other. Patient verified her desire to go home and stated that a friend of hers will be staying with her for about 1.5 weeks. Discussed home health agencies and provided list as identified by patient's insurance company as being in network. Advised that CM was told of no copays but could not guarantee - patient states that this information was likely correct since she had likely met her deductibles or out of pocket max. [facility] to [facility] - pending. Patient states that she has someone who can pick her at discharge. TOC following for transition needs.   Expected Discharge Plan: [place] Barriers to Discharge: Continued Medical Work up   Patient Goals and CMS Choice Patient states their goals for this hospitalization and ongoing recovery are:: wants to go home CMS Medicare.gov Compare Post Acute Care list provided to:: Patient Choice offered to / list presented to : Patient  Expected Discharge Plan and Services Expected Discharge Plan: [place] In-house Referral: NA Discharge Planning Services: CM Consult Post Acute Care Choice: [place] arrangements for the past 2 months: Single Family Home                 DME Arranged: N/A DME Agency: NA       HH Arranged: PT          Prior Living Arrangements/Services Living arrangements for the past 2 months: Single Family Home Lives with:: Self, Significant Other Patient  language and need for interpreter reviewed:: Yes Do you feel safe going back to the place where you live?: Yes      Need for Family Participation in Patient Care: Yes (Comment) Care giver support system in place?: Yes (comment)   Criminal Activity/Legal Involvement Pertinent to Current Situation/Hospitalization: No - Comment as needed  Activities of Daily Living Home Assistive Devices/Equipment: None ADL Screening (condition at time of admission) Patient's cognitive ability adequate to safely complete daily activities?: Yes Is the patient deaf or have difficulty hearing?: No Does the patient have difficulty seeing, even when wearing glasses/contacts?: No Does the patient have difficulty concentrating, remembering, or making decisions?: No Patient able to express need for assistance with ADLs?: Yes Does the patient have difficulty dressing or bathing?: No Independently performs ADLs?: Yes (appropriate for developmental age) Does the patient have difficulty walking or climbing stairs?: No Weakness of Legs: None Weakness of Arms/Hands: None  Permission Sought/Granted                  Emotional Assessment Appearance:: Appears stated age Attitude/Demeanor/Rapport: Engaged Affect (typically observed): Accepting Orientation: : Oriented to Self, Oriented to Place, Oriented to  Time, Oriented to Situation Alcohol / Substance Use: Not Applicable Psych Involvement: No (comment)  Admission diagnosis:  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 Pancreatic cancer ([place]) 04/15/2019  .  Primary malignant neoplasm of head of pancreas ([place]) 04/15/2019  . Acute hepatitis B   . Elevated LFTs   . Malaise and fatigue   . Liver dysfunction 07/13/2018  . Pancreatic lesion 07/13/2018  . Right leg pain 03/29/2017  . RUQ abdominal pain 06/30/2016  . Liver lesion 06/30/2016  . Gastroesophageal reflux disease 06/30/2016  .  Morbid obesity ([place]) 09/20/2015  . Severe obesity (BMI >= 40) ([place]) 09/20/2015  . Dermatitis 09/20/2015   PCP:  [name], [employer] [facility] Pharmacy:   [id number] [id number]  [id number]: [phone]: [phone]    Social Determinants of Health (SDOH) Interventions    Readmission Risk Interventions No flowsheet data found.

## 2019-04-22 NOTE — TOC Progression Note (Signed)
Transition of Care [facility]) - Progression Note    Patient Details  Name: EZEKIEL COUEY MRN: [id number] Date of Birth: Apr 06, 1976  [facility] CM/SW Contact  Bartholomew Crews, RN Phone Number: [phone] 04/22/2019, 12:02 PM  Clinical Narrative:    [facility] able to accept patient for [facility] PT only. Patient will need HH orders for PT with Face to Face upon discharge. TOC following for transition needs.    Expected Discharge Plan: [place] Barriers to Discharge: Continued Medical Work up  Expected Discharge Plan and Services Expected Discharge Plan: [place] In-house Referral: NA Discharge Planning Services: CM Consult Post Acute Care Choice: [place] arrangements for the past 2 months: Single Family Home                 DME Arranged: N/A DME Agency: NA       HH Arranged: PT [facility] [employer] Date [place]: 04/22/19 Time [place]: [place] Representative spoke with at [place]: [place]) Interventions    Readmission Risk Interventions No flowsheet data found.

## 2019-04-22 NOTE — Plan of Care (Signed)
Continue to monitor

## 2019-04-22 NOTE — Progress Notes (Signed)
Wound vac removed per MD order. Staples clean dry and intact. Left incision open to air except in crease of lower abdomen. Dry gauze applied.   K. Helaine Chess, RN

## 2019-04-23 LAB — COMPREHENSIVE METABOLIC PANEL
ALT: 84 U/L — ABNORMAL HIGH (ref 0–44)
AST: 91 U/L — ABNORMAL HIGH (ref 15–41)
Albumin: 2 g/dL — ABNORMAL LOW (ref 3.5–5.0)
Alkaline Phosphatase: 164 U/L — ABNORMAL HIGH (ref 38–126)
Anion gap: 9 (ref 5–15)
BUN: <5 mg/dL — ABNORMAL LOW (ref 6–20)
CO2: 22 mmol/L (ref 22–32)
Calcium: 8 mg/dL — ABNORMAL LOW (ref 8.9–10.3)
Chloride: 107 mmol/L (ref 98–111)
Creatinine, Ser: 0.67 mg/dL (ref 0.44–1.00)
GFR calc Af Amer: >60 mL/min (ref >60)
GFR calc non Af Amer: >60 mL/min (ref >60)
Glucose, Bld: 106 mg/dL — ABNORMAL HIGH (ref 70–99)
Potassium: 3.6 mmol/L (ref 3.5–5.1)
Sodium: 138 mmol/L (ref 135–145)
Total Bilirubin: 1.3 mg/dL — ABNORMAL HIGH (ref 0.3–1.2)
Total Protein: 5.6 g/dL — ABNORMAL LOW (ref 6.5–8.1)

## 2019-04-23 NOTE — Discharge Summary (Signed)
Physician Discharge Summary  Patient ID: Kathryn Stevenson MRN: [id number] DOB/AGE: [birth year] 43 y.o.  Admit date: 04/15/2019 Discharge date: 04/24/2019  Admission Diagnoses:  Primary malignant neoplasm of head of pancreas H/o hepatitis B GERD Obesity BMI 57.54 HTN  Discharge Diagnoses:   Primary malignant neoplasm of head of pancreas ([place]) GERD HTN  Discharged Condition:  Stable  Hospital Course:  Patient was admitted to the ICU following a pancreaticoduodenectomy for malignant neoplasm of the head of the pancreas 04/15/2019.  She had an epidural and PCA for pain control.  She had an episode of SVT and required adenosine which resolved the issue.  She developed a fever and had negative fever workup.  Antibiotics were started empirically as her pancreas was very soft.  She then developed murky drain output and drain amylase was elevated.  She had her NGT removed and was able to have her diet advanced.  Originally she had significant difficulty getting around, and it was thought she would need rehab.  However, over the week she was able to move much better and wanted to go home.    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  She was transitioned to oral narcotics and oral antibiotics and was discharged to home in stable condition.    Consults: rehab, anesthesia  Significant Diagnostic Studies: labs: Cr 0.67, T bili 1.3 prior to d/c.  WBCs 11.9  Treatments: surgery: see above.    Discharge Exam: Blood pressure 117/75, pulse 73, temperature 98.1 F (36.7 C), temperature source Oral, resp. rate 18, height 5\' 6"  (1.676 m), weight (!) 161.7 kg, last menstrual period 04/02/2019, SpO2 98 %. General appearance: alert and no distress Resp: breathing comfortably Cardio: regular rate and rhythm GI: soft, non tender.  no wound drainage, drains murky serosang Extremities: extremities normal, atraumatic, no cyanosis or edema  Disposition:    Discharge  Instructions    Call MD for:  difficulty breathing, headache or visual disturbances   Complete by: As directed    Call MD for:  hives   Complete by: As directed    Call MD for:  persistant dizziness or light-headedness   Complete by: As directed    Call MD for:  persistant nausea and vomiting   Complete by: As directed    Call MD for:  redness, tenderness, or signs of infection (pain, swelling, redness, odor or green/yellow discharge around incision site)   Complete by: As directed    Call MD for:  severe uncontrolled pain   Complete by: As directed    Call MD for:  temperature >100.4   Complete by: As directed    Change dressing (specify)   Complete by: As directed    Measure and record drain output twice daily.  Bring record to clinic.   Diet - low sodium heart healthy   Complete by: As directed    Increase activity slowly   Complete by: As directed      Allergies as of 04/23/2019   No Known Allergies     Medication List    STOP taking these medications   ibuprofen 800 MG tablet Commonly known as: ADVIL     TAKE these medications   amoxicillin-clavulanate 875-125 MG tablet Commonly known as: Augmentin Take 1 tablet by mouth 2 (two) times daily for 14 days.   hydrochlorothiazide 12.5 MG tablet Commonly known as: HYDRODIURIL Take 1 tablet (12.5 mg total) by mouth daily.   naproxen sodium 220 MG tablet Commonly known as: Aleve  Take 2 tablets (440 mg total) by mouth 2 (two) times daily with a meal. What changed:   when to take this  reasons to take this   oxyCODONE 5 MG immediate release tablet Commonly known as: Oxy IR/ROXICODONE Take 1-2 tablets (5-10 mg total) by mouth every 6 (six) hours as needed for moderate pain, severe pain or breakthrough pain.   pantoprazole 40 MG tablet Commonly known as: PROTONIX Take 1 tablet (40 mg total) by mouth daily.            Discharge Care Instructions  (From admission, onward)         Start     Ordered    04/23/19 0000  Change dressing (specify)    Comments: Measure and record drain output twice daily.  Bring record to clinic.   04/23/19 0853         Follow-up Information    Surgery, [facility]. Call in 1 week(s).   Specialty: General Surgery Why: staple removal 11/10 Contact information: [place] [address] [phone]        Stark Klein, MD. Call in 2 week(s).   Specialty: General Surgery Contact information: [address] [place] [address] [phone]           Signed: Stark Klein 04/24/2019, 10:28 AM

## 2019-04-23 NOTE — TOC Transition Note (Addendum)
Transition of Care [facility]) - CM/SW Discharge Note Marvetta Gibbons RN, BSN Transitions of Care Unit 4E- RN Case Manager [phone]   Patient Details  Name: ELLERIE PFAU MRN: [id number] Date of Birth: 11/19/1975  Transition of Care [facility]) CM/SW Contact:  Dawayne Patricia, RN Phone Number: 04/23/2019, 12:09 PM   Clinical Narrative:    Pt stable for transition home today, per last PT session - no f/u recommendations made for any therapy f/u at home- and no DME recommendations- no HH orders placed. [facility] notified.    Final next level of care: Home/Self Care Barriers to Discharge: No Barriers Identified, Barriers Resolved   Patient Goals and CMS Choice Patient states their goals for this hospitalization and ongoing recovery are:: wants to go home CMS Medicare.gov Compare Post Acute Care list provided to:: Patient Choice offered to / list presented to : Patient  Discharge Placement                Home/self care        Discharge Plan and Services In-house Referral: NA Discharge Planning Services: CM Consult Post Acute Care Choice: Home Health          DME Arranged: N/A DME Agency: NA       HH Arranged: PT [facility] [employer] Date [place]: 04/22/19 Time [place]: 8 Representative spoke with at [place]: [place]) Interventions     Readmission Risk Interventions No flowsheet data found.

## 2019-05-12 ENCOUNTER — Ambulatory Visit
Admission: RE | Admit: 2019-05-12 | Discharge: 2019-05-12 | Disposition: A | Discharge status: Home / Self Care | Payer: 59 | Source: Ambulatory Visit | Attending: General Surgery | Admitting: General Surgery

## 2019-05-12 ENCOUNTER — Other Ambulatory Visit: Payer: Self-pay | Admitting: General Surgery

## 2019-05-12 DIAGNOSIS — K8681 Exocrine pancreatic insufficiency: Secondary | ICD-10-CM

## 2019-05-12 MED ORDER — IOPAMIDOL (ISOVUE-300) INJECTION 61%
100.0000 mL | Freq: Once | INTRAVENOUS | Status: AC | PRN
  Administered 2019-05-12: 125 mL via INTRAVENOUS

## 2019-05-16 ENCOUNTER — Other Ambulatory Visit: Payer: Self-pay

## 2019-05-16 ENCOUNTER — Encounter (HOSPITAL_COMMUNITY): Payer: Self-pay | Admitting: Emergency Medicine

## 2019-05-16 ENCOUNTER — Emergency Department (HOSPITAL_COMMUNITY)
Admission: EM | Admit: 2019-05-16 | Discharge: 2019-05-16 | Disposition: A | Discharge status: Home / Self Care | Payer: 59 | Attending: Emergency Medicine | Admitting: Emergency Medicine

## 2019-05-16 ENCOUNTER — Emergency Department (HOSPITAL_COMMUNITY): Payer: 59

## 2019-05-16 DIAGNOSIS — R102 Pelvic and perineal pain: Secondary | ICD-10-CM | POA: Diagnosis not present

## 2019-05-16 DIAGNOSIS — Z79899 Other long term (current) drug therapy: Secondary | ICD-10-CM | POA: Diagnosis not present

## 2019-05-16 DIAGNOSIS — R55 Syncope and collapse: Secondary | ICD-10-CM | POA: Insufficient documentation

## 2019-05-16 DIAGNOSIS — C250 Malignant neoplasm of head of pancreas: Secondary | ICD-10-CM | POA: Diagnosis not present

## 2019-05-16 DIAGNOSIS — S99911A Unspecified injury of right ankle, initial encounter: Secondary | ICD-10-CM | POA: Insufficient documentation

## 2019-05-16 DIAGNOSIS — Y939 Activity, unspecified: Secondary | ICD-10-CM | POA: Insufficient documentation

## 2019-05-16 DIAGNOSIS — Y92002 Bathroom of unspecified non-institutional (private) residence single-family (private) house as the place of occurrence of the external cause: Secondary | ICD-10-CM | POA: Diagnosis not present

## 2019-05-16 DIAGNOSIS — Z87891 Personal history of nicotine dependence: Secondary | ICD-10-CM | POA: Insufficient documentation

## 2019-05-16 DIAGNOSIS — Y999 Unspecified external cause status: Secondary | ICD-10-CM | POA: Diagnosis not present

## 2019-05-16 DIAGNOSIS — W1839XA Other fall on same level, initial encounter: Secondary | ICD-10-CM | POA: Diagnosis not present

## 2019-05-16 DIAGNOSIS — N390 Urinary tract infection, site not specified: Secondary | ICD-10-CM | POA: Diagnosis not present

## 2019-05-16 LAB — URINALYSIS, ROUTINE W REFLEX MICROSCOPIC
Glucose, UA: NEGATIVE mg/dL
Hgb urine dipstick: NEGATIVE
Ketones, ur: 15 mg/dL — AB
Nitrite: NEGATIVE
Protein, ur: 100 mg/dL — AB
Specific Gravity, Urine: 1.02 (ref 1.005–1.030)
pH: 6 (ref 5.0–8.0)

## 2019-05-16 LAB — URINALYSIS, MICROSCOPIC (REFLEX)

## 2019-05-16 LAB — CBC
HCT: 32 % — ABNORMAL LOW (ref 36.0–46.0)
Hemoglobin: 11 g/dL — ABNORMAL LOW (ref 12.0–15.0)
MCH: 27.4 pg (ref 26.0–34.0)
MCHC: 34.4 g/dL (ref 30.0–36.0)
MCV: 79.8 fL — ABNORMAL LOW (ref 80.0–100.0)
Platelets: 432 10*3/uL — ABNORMAL HIGH (ref 150–400)
RBC: 4.01 MIL/uL (ref 3.87–5.11)
RDW: 15 % (ref 11.5–15.5)
WBC: 13.3 10*3/uL — ABNORMAL HIGH (ref 4.0–10.5)
nRBC: 0 % (ref 0.0–0.2)

## 2019-05-16 LAB — BASIC METABOLIC PANEL
Anion gap: 12 (ref 5–15)
BUN: 8 mg/dL (ref 6–20)
CO2: 28 mmol/L (ref 22–32)
Calcium: 8.6 mg/dL — ABNORMAL LOW (ref 8.9–10.3)
Chloride: 92 mmol/L — ABNORMAL LOW (ref 98–111)
Creatinine, Ser: 0.89 mg/dL (ref 0.44–1.00)
GFR calc Af Amer: >60 mL/min (ref >60)
GFR calc non Af Amer: >60 mL/min (ref >60)
Glucose, Bld: 131 mg/dL — ABNORMAL HIGH (ref 70–99)
Potassium: 3.1 mmol/L — ABNORMAL LOW (ref 3.5–5.1)
Sodium: 132 mmol/L — ABNORMAL LOW (ref 135–145)

## 2019-05-16 LAB — PREGNANCY, URINE: Preg Test, Ur: NEGATIVE

## 2019-05-16 LAB — D-DIMER, QUANTITATIVE: D-Dimer, Quant: 4.07 ug/mL-FEU — ABNORMAL HIGH (ref 0.00–0.50)

## 2019-05-16 LAB — I-STAT BETA HCG BLOOD, ED (MC, WL, AP ONLY): I-stat hCG, quantitative: <5 m[IU]/mL (ref <5)

## 2019-05-16 LAB — CBG MONITORING, ED: Glucose-Capillary: 146 mg/dL — ABNORMAL HIGH (ref 70–99)

## 2019-05-16 MED ORDER — FENTANYL CITRATE (PF) 100 MCG/2ML IJ SOLN
100.0000 ug | Freq: Once | INTRAMUSCULAR | Status: AC
  Administered 2019-05-16: 100 ug via INTRAVENOUS
  Filled 2019-05-16: qty 2

## 2019-05-16 MED ORDER — KETOROLAC TROMETHAMINE 15 MG/ML IJ SOLN
15.0000 mg | Freq: Once | INTRAMUSCULAR | Status: AC
  Administered 2019-05-16: 15 mg via INTRAVENOUS
  Filled 2019-05-16: qty 1

## 2019-05-16 MED ORDER — IOHEXOL 350 MG/ML SOLN
100.0000 mL | Freq: Once | INTRAVENOUS | Status: AC | PRN
  Administered 2019-05-16: 100 mL via INTRAVENOUS

## 2019-05-16 MED ORDER — ONDANSETRON HCL 4 MG/2ML IJ SOLN
4.0000 mg | Freq: Once | INTRAMUSCULAR | Status: AC
  Administered 2019-05-16: 4 mg via INTRAVENOUS
  Filled 2019-05-16: qty 2

## 2019-05-16 MED ORDER — SODIUM CHLORIDE 0.9% FLUSH: 3.0000 mL | Freq: Once | INTRAVENOUS | Status: DC

## 2019-05-16 MED ORDER — SODIUM CHLORIDE (PF) 0.9 % IJ SOLN
INTRAMUSCULAR | Status: AC
  Filled 2019-05-16: qty 50

## 2019-05-16 MED ORDER — FOSFOMYCIN TROMETHAMINE 3 G PO PACK
3.0000 g | PACK | Freq: Once | ORAL | Status: DC
  Filled 2019-05-16: qty 3

## 2019-05-16 MED ORDER — SODIUM CHLORIDE 0.9 % IV BOLUS
1000.0000 mL | Freq: Once | INTRAVENOUS | Status: AC
  Administered 2019-05-16: 1000 mL via INTRAVENOUS

## 2019-05-16 NOTE — ED Triage Notes (Signed)
Pt presents from home via [facility] for evaluation of syncopal episode in bathroom with no memory of event and right ankle pain. Pt reports having surgery on pancreatic head several weeks ago and has not been able to eat or drink much since then.

## 2019-05-16 NOTE — ED Provider Notes (Signed)
[place] DEPT Provider Note: Georgena Spurling, MD, FACEP  CSN: [id number] MRN: [id number] ARRIVAL: 05/16/19 at [place]: [place]  Syncope and Ankle Injury   HISTORY OF PRESENT ILLNESS  05/16/19 1:16 AM Kathryn Stevenson is a 43 y.o. female who had a Whipple procedure on 04/15/2019 for pancreatic cancer.  She has had limited oral intake since the surgery.  She is here after a syncopal episode that occurred in the bathroom just prior to arrival.  She does not remember the episode.  In the fall she injured her right ankle and is having pain which she rates as a 10 out of 10, worse with movement or palpation.  She denies any prodromal or current chest pain, shortness of breath, abdominal pain, nausea, vomiting or leg swelling.    Past Medical History:  Diagnosis Date  . Acid reflux   . Dermatitis   . Fracture, ankle   . GERD (gastroesophageal reflux disease)   . Hepatic hemangioma   . Hepatitis B   . Malignant neoplasm of head of pancreas ([place])   . Morbid obesity ([place])   . Pancreatic lesion 06/2018   seen on MR of the liver     Past Surgical History:  Procedure Laterality Date  . ANKLE SURGERY Left   . BIOPSY  01/15/2019   Procedure: BIOPSY;  Surgeon: Rush Landmark Telford Nab., MD;  Location: [facility];  Service: Gastroenterology;;  . ESOPHAGOGASTRODUODENOSCOPY (EGD) WITH PROPOFOL N/A 01/15/2019   Procedure: ESOPHAGOGASTRODUODENOSCOPY (EGD) WITH PROPOFOL;  Surgeon: Irving Copas., MD;  Location: [facility];  Service: Gastroenterology;  Laterality: N/A;  . FINE NEEDLE ASPIRATION  01/15/2019   Procedure: FINE NEEDLE ASPIRATION (FNA) LINEAR;  Surgeon: Irving Copas., MD;  Location: [facility];  Service: Gastroenterology;;  . INDUCED ABORTION    . MOUTH SURGERY    . TUBAL LIGATION    . UPPER ESOPHAGEAL ENDOSCOPIC ULTRASOUND (EUS) N/A 01/15/2019   Procedure: UPPER ESOPHAGEAL ENDOSCOPIC ULTRASOUND (EUS);  Surgeon: Irving Copas.,  MD;  Location: [facility];  Service: Gastroenterology;  Laterality: N/A;  . WHIPPLE PROCEDURE N/A 04/15/2019   Procedure: WHIPPLE PROCEDURE;  Surgeon: Stark Klein, MD;  Location: [facility] OR;  Service: General;  Laterality: N/A;  EPIDURAL    Family History  Problem Relation Age of Onset  . Hypertension Mother   . Ovarian cancer Maternal Grandmother   . Heart attack Maternal Grandmother   . Esophageal cancer Neg Hx   . Colon cancer Neg Hx   . Pancreatic cancer Neg Hx   . Stomach cancer Neg Hx   . Liver disease Neg Hx     Social History   Tobacco Use  . Smoking status: Former Smoker    Packs/day: 0.50    Types: Cigarettes    Quit date: 12/11/2016    Years since quitting: 2.4  . Smokeless tobacco: Never Used  Substance Use Topics  . Alcohol use: Yes    Comment: occasionally  . Drug use: No    Prior to Admission medications   Medication Sig Start Date End Date Taking? Authorizing Provider  hydrochlorothiazide (HYDRODIURIL) 12.5 MG tablet Take 1 tablet (12.5 mg total) by mouth daily. 01/27/19  Yes [name], FNP  levofloxacin (LEVAQUIN) 750 MG tablet Take 750 mg by mouth daily. 05/12/19  Yes [provider]  naproxen sodium (ALEVE) 220 MG tablet Take 2 tablets (440 mg total) by mouth 2 (two) times daily with a meal. Patient taking differently: Take 440 mg by mouth  2 (two) times daily as needed (for pain or headache).  12/16/18  Yes [name], FNP  oxyCODONE (OXY IR/ROXICODONE) 5 MG immediate release tablet Take 1-2 tablets (5-10 mg total) by mouth every 6 (six) hours as needed for moderate pain, severe pain or breakthrough pain. 04/22/19  Yes Stark Klein, MD  pantoprazole (PROTONIX) 40 MG tablet Take 1 tablet (40 mg total) by mouth daily. 03/04/19  Yes Danis, Kirke Corin, MD  promethazine (PHENERGAN) 12.5 MG tablet Take 12.5 mg by mouth daily as needed for nausea.  05/12/19  Yes [provider]    Allergies Patient has no known allergies.   REVIEW OF  SYSTEMS  Negative except as noted here or in the History of Present Illness.   PHYSICAL EXAMINATION  Initial Vital Signs Blood pressure 114/87, pulse 79, temperature 98.1 F (36.7 C), temperature source Oral, resp. rate 15, height 5\' 8"  (1.727 m), weight (!) 161.7 kg, SpO2 98 %.  Examination General: Well-developed, obese female in no acute distress; appearance consistent with age of record HENT: normocephalic; atraumatic Eyes: pupils equal, round and reactive to light; extraocular muscles intact Neck: supple Heart: regular rate and rhythm Lungs: clear to auscultation bilaterally Abdomen: soft; nondistended; nontender; bowel sounds present Extremities: No deformity; tenderness and swelling of right lateral malleolus; pulses normal; no edema Neurologic: Awake, alert and oriented; motor function intact in all extremities and symmetric; no facial droop Skin: Warm and dry Psychiatric: Normal mood and affect   RESULTS  Summary of this visit's results, reviewed and interpreted by myself:   EKG Interpretation  Date/Time:  Kiernan May 16 2019 01:03:11 EST Ventricular Rate:  75 PR Interval:    QRS Duration: 106 QT Interval:  433 QTC Calculation: 484 R Axis:     Text Interpretation: Sinus rhythm Ventricular premature complex Left ventricular hypertrophy Rate is slower Confirmed by ,  [phone]) on 05/16/2019 1:12:31 AM      Laboratory Studies: Results for orders placed or performed during the hospital encounter of 05/16/19 (from the past 24 hour(s))  Basic metabolic panel     Status: Abnormal   Collection Time: 05/16/19  1:09 AM  Result Value Ref Range   Sodium 132 (L) 135 - 145 mmol/L   Potassium 3.1 (L) 3.5 - 5.1 mmol/L   Chloride 92 (L) 98 - 111 mmol/L   CO2 28 22 - 32 mmol/L   Glucose, Bld 131 (H) 70 - 99 mg/dL   BUN 8 6 - 20 mg/dL   Creatinine, Ser 0.89 0.44 - 1.00 mg/dL   Calcium 8.6 (L) 8.9 - 10.3 mg/dL   GFR calc non Af Amer >60 >60 mL/min   GFR calc Af  Amer >60 >60 mL/min   Anion gap 12 5 - 15  CBC     Status: Abnormal   Collection Time: 05/16/19  1:09 AM  Result Value Ref Range   WBC 13.3 (H) 4.0 - 10.5 K/uL   RBC 4.01 3.87 - 5.11 MIL/uL   Hemoglobin 11.0 (L) 12.0 - 15.0 g/dL   HCT 32.0 (L) 36.0 - 46.0 %   MCV 79.8 (L) 80.0 - 100.0 fL   MCH 27.4 26.0 - 34.0 pg   MCHC 34.4 30.0 - 36.0 g/dL   RDW 15.0 11.5 - 15.5 %   Platelets 432 (H) 150 - 400 K/uL   nRBC 0.0 0.0 - 0.2 %  Urinalysis, Routine w reflex microscopic     Status: Abnormal   Collection Time: 05/16/19  1:09 AM  Result  Value Ref Range   Color, Urine YELLOW YELLOW   APPearance CLEAR CLEAR   Specific Gravity, Urine 1.020 1.005 - 1.030   pH 6.0 5.0 - 8.0   Glucose, UA NEGATIVE NEGATIVE mg/dL   Hgb urine dipstick NEGATIVE NEGATIVE   Bilirubin Urine MODERATE (A) NEGATIVE   Ketones, ur 15 (A) NEGATIVE mg/dL   Protein, ur 100 (A) NEGATIVE mg/dL   Nitrite NEGATIVE NEGATIVE   Leukocytes,Ua TRACE (A) NEGATIVE  Urinalysis, Microscopic (reflex)     Status: Abnormal   Collection Time: 05/16/19  1:09 AM  Result Value Ref Range   RBC / HPF 0-5 0 - 5 RBC/hpf   WBC, UA 11-20 0 - 5 WBC/hpf   Bacteria, UA FEW (A) NONE SEEN   Squamous Epithelial / LPF 11-20 0 - 5   Hyaline Casts, UA PRESENT   Pregnancy, urine     Status: None   Collection Time: 05/16/19  1:26 AM  Result Value Ref Range   Preg Test, Ur NEGATIVE NEGATIVE  D-dimer, quantitative (not at [facility])     Status: Abnormal   Collection Time: 05/16/19  1:26 AM  Result Value Ref Range   D-Dimer, Quant 4.07 (H) 0.00 - 0.50 ug/mL-FEU  CBG monitoring, ED     Status: Abnormal   Collection Time: 05/16/19  1:45 AM  Result Value Ref Range   Glucose-Capillary 146 (H) 70 - 99 mg/dL  I-Stat beta hCG blood, ED     Status: None   Collection Time: 05/16/19  3:37 AM  Result Value Ref Range   I-stat hCG, quantitative <5.0 <5 mIU/mL   Comment 3           Imaging Studies: Dg Ankle Complete Right  Result Date: 05/16/2019 CLINICAL  DATA:  Recent fall with ankle pain, initial encounter EXAM: RIGHT ANKLE - COMPLETE 3+ VIEW COMPARISON:  03/27/2018 FINDINGS: Mild medial soft tissue swelling is noted. Calcaneal spur is noted. The previously seen avulsion fracture is again identified and well corticated. No acute fracture is seen. Tarsal degenerative changes are noted. IMPRESSION: Medial soft tissue swelling as described. Previously seen laterally avulsion is again noted and stable with increased cortication. No acute fracture is noted. Electronically Signed   By: Inez Catalina M.D.   On: 05/16/2019 01:55   Ct Angio Chest Pe W And/or Wo Contrast  Result Date: 05/16/2019 CLINICAL DATA:  Syncopal event while using the bathroom, recent surgery 04/15/2019 for pancreatic neoplasm EXAM: CT ANGIOGRAPHY CHEST WITH CONTRAST TECHNIQUE: Multidetector CT imaging of the chest was performed using the standard protocol during bolus administration of intravenous contrast. Multiplanar CT image reconstructions and MIPs were obtained to evaluate the vascular anatomy. CONTRAST:  181mL OMNIPAQUE IOHEXOL 350 MG/ML SOLN COMPARISON:  Chest radiograph 04/16/2016 FINDINGS: Cardiovascular: Satisfactory opacification the pulmonary arteries to the segmental level. No pulmonary artery filling defects are identified. Central pulmonary arteries are normal caliber. Normal heart size. No pericardial effusion. Normal caliber thoracic aorta. Shared origin of the brachiocephalic and left common carotid artery. Mediastinum/Nodes: No enlarged mediastinal, hilar, or axillary lymph nodes. Thyroid gland, trachea, and esophagus demonstrate no significant findings. Lungs/Pleura: Bandlike areas of atelectasis are noted in the lung bases with more dependent atelectasis posteriorly. No consolidation, features of edema, pneumothorax, or effusion. No suspicious pulmonary nodules or masses. Upper Abdomen: No acute abnormalities present in the visualized portions of the upper abdomen. Known  postsurgical changes are not well visualized. Musculoskeletal: Minimal degenerative changes in the spine. No acute osseous abnormality or suspicious osseous lesion. No  suspicious chest wall lesions. Review of the MIP images confirms the above findings. IMPRESSION: 1. Negative for pulmonary embolus. 2. Bibasilar dependent and subsegmental atelectasis. Electronically Signed   By: Lovena Le M.D.   On: 05/16/2019 06:29    ED COURSE and MDM  Nursing notes, initial and subsequent vitals signs, including pulse oximetry, reviewed and interpreted by myself.  Vitals:   05/16/19 0400 05/16/19 0430 05/16/19 0500 05/16/19 0530  BP: 125/87 124/89 129/90 132/79  Pulse: 80 82 93 91  Resp: (!) 21 (!) 24 15 11   Temp:      TempSrc:      SpO2: 95% 94% 95% 97%  Weight:      Height:       Medications  sodium chloride flush (NS) 0.9 % injection 3 mL (has no administration in time range)  sodium chloride (PF) 0.9 % injection (has no administration in time range)  fosfomycin (MONUROL) packet 3 g (has no administration in time range)  sodium chloride 0.9 % bolus 1,000 mL (1,000 mLs Intravenous New Bag/Given (Non-Interop) 05/16/19 0418)  ondansetron (ZOFRAN) injection 4 mg (4 mg Intravenous Given 05/16/19 0300)  fentaNYL (SUBLIMAZE) injection 100 mcg (100 mcg Intravenous Given 05/16/19 0304)  iohexol (OMNIPAQUE) 350 MG/ML injection 100 mL (100 mLs Intravenous Contrast Given 05/16/19 0534)  ketorolac (TORADOL) 15 MG/ML injection 15 mg (15 mg Intravenous Given 05/16/19 0623)  ondansetron (ZOFRAN) injection 4 mg (4 mg Intravenous Given 05/16/19 0621)   4:00 AM Patient has a significant elevated D-dimer in the context of recent surgery and syncopal episode.  We will obtain a CT angio chest to evaluate for pulmonary embolism.  Ankle placed in ASO for sprain.  6:42 AM Patient's CT negative for pulmonary embolism.  D-dimer likely elevated postoperatively.  She is complaining of pelvic cramping which improved with  IV Toradol.  This could be a urinary tract infection and we will treat with a dose of fosfomycin in the ED.  The cause of her syncope is unclear and she was advised to follow-up with her PCP.    PROCEDURES  Procedures   ED DIAGNOSES     ICD-10-CM   1. Syncope, unspecified syncope type  R55   2. Injury of right ankle, initial encounter  S99.911A   3. Lower urinary tract infectious disease  N39.0        , , MD 05/16/19 [phone]

## 2019-05-16 NOTE — ED Notes (Signed)
IV team at bedside still attempting to gain IV access.

## 2019-05-16 NOTE — ED Notes (Signed)
Pt refused crutches.

## 2019-05-16 NOTE — ED Notes (Signed)
IV team at bedside 

## 2019-06-18 ENCOUNTER — Ambulatory Visit: Payer: 59 | Admitting: Family Medicine

## 2019-06-24 ENCOUNTER — Ambulatory Visit: Payer: 59 | Admitting: Family Medicine

## 2019-08-06 ENCOUNTER — Encounter: Payer: Self-pay | Admitting: Nurse Practitioner

## 2019-08-06 ENCOUNTER — Other Ambulatory Visit: Payer: Self-pay

## 2019-08-06 ENCOUNTER — Ambulatory Visit (INDEPENDENT_AMBULATORY_CARE_PROVIDER_SITE_OTHER): Payer: 59 | Admitting: Nurse Practitioner

## 2019-08-06 VITALS — BP 114/90 | HR 60 | Temp 99.1°F | Resp 16 | Ht 66.0 in | Wt 313.0 lb

## 2019-08-06 DIAGNOSIS — Z0000 Encounter for general adult medical examination without abnormal findings: Secondary | ICD-10-CM

## 2019-08-06 DIAGNOSIS — R21 Rash and other nonspecific skin eruption: Secondary | ICD-10-CM | POA: Diagnosis not present

## 2019-08-06 DIAGNOSIS — F329 Major depressive disorder, single episode, unspecified: Secondary | ICD-10-CM | POA: Diagnosis not present

## 2019-08-06 DIAGNOSIS — F32A Depression, unspecified: Secondary | ICD-10-CM

## 2019-08-06 DIAGNOSIS — Z202 Contact with and (suspected) exposure to infections with a predominantly sexual mode of transmission: Secondary | ICD-10-CM

## 2019-08-06 MED ORDER — BUPROPION HCL ER (XL) 150 MG PO TB24: 150.0000 mg | ORAL_TABLET | Freq: Every day | ORAL | 0 refills | Status: DC

## 2019-08-06 NOTE — Patient Instructions (Signed)
Rash, Adult  A rash is a change in the color of your skin. A rash can also change the way your skin feels. There are many different conditions and factors that can cause a rash. Follow these instructions at home: The goal of treatment is to stop the itching and keep the rash from spreading. Watch for any changes in your symptoms. Let your doctor know about them. Follow these instructions to help with your condition: Medicine Take or apply over-the-counter and prescription medicines only as told by your doctor. These may include medicines:  To treat red or swollen skin (corticosteroid creams).  To treat itching.  To treat an allergy (oral antihistamines).  To treat very bad symptoms (oral corticosteroids).  Skin care  Put cool cloths (compresses) on the affected areas.  Do not scratch or rub your skin.  Avoid covering the rash. Make sure that the rash is exposed to air as much as possible. Managing itching and discomfort  Avoid hot showers or baths. These can make itching worse. A cold shower may help.  Try taking a bath with: ? Epsom salts. You can get these at your local pharmacy or grocery store. Follow the instructions on the package. ? Baking soda. Pour a small amount into the bath as told by your doctor. ? Colloidal oatmeal. You can get this at your local pharmacy or grocery store. Follow the instructions on the package.  Try putting baking soda paste onto your skin. Stir water into baking soda until it gets like a paste.  Try putting on a lotion that relieves itchiness (calamine lotion).  Keep cool and out of the sun. Sweating and being hot can make itching worse. General instructions   Rest as needed.  Drink enough fluid to keep your pee (urine) pale yellow.  Wear loose-fitting clothing.  Avoid scented soaps, detergents, and perfumes. Use gentle soaps, detergents, perfumes, and other cosmetic products.  Avoid anything that causes your rash. Keep a journal to  help track what causes your rash. Write down: ? What you eat. ? What cosmetic products you use. ? What you drink. ? What you wear. This includes jewelry.  Keep all follow-up visits as told by your doctor. This is important. Contact a doctor if:  You sweat at night.  You lose weight.  You pee (urinate) more than normal.  You pee less than normal, or you notice that your pee is a darker color than normal.  You feel weak.  You throw up (vomit).  Your skin or the whites of your eyes look yellow (jaundice).  Your skin: ? Tingles. ? Is numb.  Your rash: ? Does not go away after a few days. ? Gets worse.  You are: ? More thirsty than normal. ? More tired than normal.  You have: ? New symptoms. ? Pain in your belly (abdomen). ? A fever. ? Watery poop (diarrhea). Get help right away if:  You have a fever and your symptoms suddenly get worse.  You start to feel mixed up (confused).  You have a very bad headache or a stiff neck.  You have very bad joint pains or stiffness.  You have jerky movements that you cannot control (seizure).  Your rash covers all or most of your body. The rash may or may not be painful.  You have blisters that: ? Are on top of the rash. ? Grow larger. ? Grow together. ? Are painful. ? Are inside your nose or mouth.  You have a rash   that: ? Looks like purple pinprick-sized spots all over your body. ? Has a "bull's eye" or looks like a target. ? Is red and painful, causes your skin to peel, and is not from being in the sun too long. Summary  A rash is a change in the color of your skin. A rash can also change the way your skin feels.  The goal of treatment is to stop the itching and keep the rash from spreading.  Take or apply over-the-counter and prescription medicines only as told by your doctor.  Contact a doctor if you have new symptoms or symptoms that get worse.  Keep all follow-up visits as told by your doctor. This is  important. This information is not intended to replace advice given to you by your health care provider. Make sure you discuss any questions you have with your health care provider. Document Revised: 09/27/2018 Document Reviewed: 01/07/2018 Elsevier Patient Education  2020 [employer]  

## 2019-08-06 NOTE — Progress Notes (Signed)
Established Patient Office Visit  Subjective:  Patient ID: Kathryn Stevenson, female    DOB: 11-20-1975  Age: 44 y.o. MRN: [id number]  CC:  Chief Complaint  Patient presents with  . Exposure to STD    wants to be checked for std's   . Medication Management    wants to discuss starting back on wellbutrin to help focus?  Marland Kitchen Acne    discuss getting referred to dermatology?    HPI Kathryn Stevenson presents for follow-up. She has a past medical history of Acid reflux, Dermatitis, Fracture, ankle, GERD (gastroesophageal reflux disease), Hepatic hemangioma, Hepatitis B, Malignant neoplasm of head of pancreas ([place]), Morbid obesity ([place]), and Pancreatic lesion (06/2018).  She is in today for follow-up.  She is a previous patient Kathryn Boast, FNP.  She is recently become sexually active and would like to be screened for all sexually transmitted diseases.  She is having some vaginal irritation.  Denies any pelvic pain or tenderness, amenorrhea irregular bleeding or prolonged heavy bleeding.  Denies vaginal discharge or dysuria.  Denies ulcers or lesions.  Her last menstrual cycle was in January. She admits that overall she is doing well from her Whipple procedure.  She is currently stable on medical leave.  She has been seeing a therapist.  She will be starting work next month.  She was on Wellbutrin in the past for smoking cessation.  She admits that during the time on the medication she felt like she was able to focus more.  It was effective for smoking cessation.  She remembers having some hallucinations but she feels like this was only because she read this has been a possible side effect.  She would like to restart the Wellbutrin to see if this will be helpful when she returns to work.  She is very eager to get healthier. She has noticed facial irritation.  She denies any itching.  She did have some dark discoloration around her right eye.  She admits that this is part of a facial injury.  She has been  using over-the-counter exfoliates.  She feels like this has been effective.  She admits that she has seen her skin tone getting lighter.  She wants to keep this going.   Past Medical History:  Diagnosis Date  . Acid reflux   . Dermatitis   . Fracture, ankle   . GERD (gastroesophageal reflux disease)   . Hepatic hemangioma   . Hepatitis B   . Malignant neoplasm of head of pancreas ([place])   . Morbid obesity ([place])   . Pancreatic lesion 06/2018   seen on MR of the liver     Past Surgical History:  Procedure Laterality Date  . ANKLE SURGERY Left   . BIOPSY  01/15/2019   Procedure: BIOPSY;  Surgeon: Rush Landmark Telford Nab., MD;  Location: [facility];  Service: Gastroenterology;;  . ESOPHAGOGASTRODUODENOSCOPY (EGD) WITH PROPOFOL N/A 01/15/2019   Procedure: ESOPHAGOGASTRODUODENOSCOPY (EGD) WITH PROPOFOL;  Surgeon: Irving Copas., MD;  Location: [facility];  Service: Gastroenterology;  Laterality: N/A;  . FINE NEEDLE ASPIRATION  01/15/2019   Procedure: FINE NEEDLE ASPIRATION (FNA) LINEAR;  Surgeon: Irving Copas., MD;  Location: [facility];  Service: Gastroenterology;;  . INDUCED ABORTION    . MOUTH SURGERY    . TUBAL LIGATION    . UPPER ESOPHAGEAL ENDOSCOPIC ULTRASOUND (EUS) N/A 01/15/2019   Procedure: UPPER ESOPHAGEAL ENDOSCOPIC ULTRASOUND (EUS);  Surgeon: Irving Copas., MD;  Location: [facility];  Service: Gastroenterology;  Laterality: N/A;  . WHIPPLE PROCEDURE N/A 04/15/2019   Procedure: WHIPPLE PROCEDURE;  Surgeon: Stark Klein, MD;  Location: [facility] OR;  Service: General;  Laterality: N/A;  EPIDURAL    Family History  Problem Relation Age of Onset  . Hypertension Mother   . Ovarian cancer Maternal Grandmother   . Heart attack Maternal Grandmother   . Esophageal cancer Neg Hx   . Colon cancer Neg Hx   . Pancreatic cancer Neg Hx   . Stomach cancer Neg Hx   . Liver disease Neg Hx     Social History   Socioeconomic History  . Marital status:  Single    Spouse name: Not on file  . Number of children: Not on file  . Years of education: Not on file  . Highest education level: Not on file  Occupational History  . Not on file  Tobacco Use  . Smoking status: Former Smoker    Packs/day: 0.50    Types: Cigarettes    Quit date: 12/11/2016    Years since quitting: 2.6  . Smokeless tobacco: Never Used  Substance and Sexual Activity  . Alcohol use: Yes    Comment: occasionally  . Drug use: No  . Sexual activity: Yes    Partners: Male    Birth control/protection: Surgical  Other Topics Concern  . Not on file  Social History Narrative  . Not on file   Social Determinants of Health   Financial Resource Strain:   . Difficulty of Paying Living Expenses: Not on file  Food Insecurity:   . Worried About Charity fundraiser in the Last Year: Not on file  . Ran Out of Food in the Last Year: Not on file  Transportation Needs:   . Lack of Transportation (Medical): Not on file  . Lack of Transportation (Non-Medical): Not on file  Physical Activity:   . Days of Exercise per Week: Not on file  . Minutes of Exercise per Session: Not on file  Stress:   . Feeling of Stress : Not on file  Social Connections:   . Frequency of Communication with Friends and Family: Not on file  . Frequency of Social Gatherings with Friends and Family: Not on file  . Attends Religious Services: Not on file  . Active Member of Clubs or Organizations: Not on file  . Attends Archivist Meetings: Not on file  . Marital Status: Not on file  Intimate Partner Violence:   . Fear of Current or Ex-Partner: Not on file  . Emotionally Abused: Not on file  . Physically Abused: Not on file  . Sexually Abused: Not on file    Outpatient Medications Prior to Visit  Medication Sig Dispense Refill  . hydrochlorothiazide (HYDRODIURIL) 12.5 MG tablet Take 1 tablet (12.5 mg total) by mouth daily. 90 tablet 3  . lipase/protease/amylase (CREON) 12000 units CPEP  capsule Take by mouth.    . multivitamin-iron-minerals-folic acid (CENTRUM) chewable tablet Chew 1 tablet by mouth daily.    . pantoprazole (PROTONIX) 40 MG tablet Take 1 tablet (40 mg total) by mouth daily. (Patient not taking: Reported on 08/06/2019) 28 tablet 0  . levofloxacin (LEVAQUIN) 750 MG tablet Take 750 mg by mouth daily.    . naproxen sodium (ALEVE) 220 MG tablet Take 2 tablets (440 mg total) by mouth 2 (two) times daily with a meal. (Patient taking differently: Take 440 mg by mouth 2 (two) times daily as needed (for pain or headache). )    .  oxyCODONE (OXY IR/ROXICODONE) 5 MG immediate release tablet Take 1-2 tablets (5-10 mg total) by mouth every 6 (six) hours as needed for moderate pain, severe pain or breakthrough pain. 40 tablet 0  . promethazine (PHENERGAN) 12.5 MG tablet Take 12.5 mg by mouth daily as needed for nausea.      No facility-administered medications prior to visit.    No Known Allergies  ROS Review of Systems  Constitutional: Positive for appetite change.  HENT: Negative.   Eyes: Negative.   Respiratory: Negative.   Cardiovascular: Negative.   Gastrointestinal: Negative.   Endocrine: Negative.   Genitourinary: Negative.   Musculoskeletal: Negative.   Skin: Negative.   Allergic/Immunologic: Negative.   Neurological: Negative.   Hematological: Negative.   Psychiatric/Behavioral: Negative.        Focal changes       Objective:    Physical Exam  Constitutional: She is oriented to person, place, and time. She appears well-developed and well-nourished.  HENT:  Head: Normocephalic.  Cardiovascular: Normal rate, regular rhythm, normal heart sounds and intact distal pulses.  Pulmonary/Chest: Effort normal.  Abdominal: Soft.  Midline abdominal incision well healed  Genitourinary:    Genitourinary Comments: Deferred   Musculoskeletal:        General: Normal range of motion.     Cervical back: Normal range of motion.  Neurological: She is alert and  oriented to person, place, and time.  Skin: Rash noted.  facial  Psychiatric: She has a normal mood and affect. Her behavior is normal. Judgment and thought content normal.    BP 114/90 (BP Location: Left Arm, Patient Position: Sitting, Cuff Size: Large)   Pulse 60   Temp 99.1 F (37.3 C) (Oral)   Resp 16   Ht 5\' 6"  (1.676 m)   Wt (!) 313 lb (142 kg)   LMP 07/25/2019   SpO2 100%   BMI 50.52 kg/m  Wt Readings from Last 3 Encounters:  08/06/19 (!) 313 lb (142 kg)  05/16/19 (!) 356 lb 7.7 oz (161.7 kg)  04/19/19 (!) 356 lb 7.7 oz (161.7 kg)     There are no preventive care reminders to display for this patient.  There are no preventive care reminders to display for this patient.  Lab Results  Component Value Date   TSH 2.050 08/06/2019   Lab Results  Component Value Date   WBC 8.2 08/06/2019   HGB 11.6 08/06/2019   HCT 34.5 08/06/2019   MCV 81 08/06/2019   PLT 376 08/06/2019   Lab Results  Component Value Date   NA 142 08/06/2019   K 4.1 08/06/2019   CO2 28 05/16/2019   GLUCOSE 88 08/06/2019   BUN 5 (L) 08/06/2019   CREATININE 0.85 08/06/2019   BILITOT 0.2 08/06/2019   ALKPHOS 101 08/06/2019   AST 19 08/06/2019   ALT 84 (H) 04/23/2019   PROT 7.4 08/06/2019   ALBUMIN 3.9 08/06/2019   CALCIUM 9.1 08/06/2019   ANIONGAP 12 05/16/2019   Lab Results  Component Value Date   CHOL 160 09/20/2015   Lab Results  Component Value Date   HDL 59 09/20/2015   Lab Results  Component Value Date   LDLCALC 81 09/20/2015   Lab Results  Component Value Date   TRIG 100 09/20/2015   Lab Results  Component Value Date   CHOLHDL 2.7 09/20/2015   Lab Results  Component Value Date   HGBA1C 5.4 04/09/2019      Assessment & Plan:   Problem List Items  Addressed This Visit      Unprioritized   Severe obesity (BMI >= 40) (HCC)    Other Visit Diagnoses    Facial rash    -  Primary   Depression, unspecified depression type       Relevant Medications   buPROPion  (WELLBUTRIN XL) 150 MG 24 hr tablet   Healthcare maintenance       Relevant Orders   MM DIGITAL SCREENING BILATERAL   CBC with Differential/Platelet (Completed)   Comp. Metabolic Panel (12) (Completed)   TSH (Completed)   VITAMIN D 25 Hydroxy (Vit-D Deficiency, Fractures) (Completed)   Vitamin B12 (Completed)   Magnesium (Completed)   C-reactive protein (Completed)   Sedimentation rate (Completed)   STD exposure       Relevant Orders   STD Screen (8) (Completed)   Chlamydia/Gonococcus/Trichomonas, NAA (Completed)      Meds ordered this encounter  Medications  . buPROPion (WELLBUTRIN XL) 150 MG 24 hr tablet    Sig: Take 1 tablet (150 mg total) by mouth daily.    Dispense:  90 tablet    Refill:  0    Order Specific Question:   Supervising Provider    Answer:   Tresa Garter [id number]    Follow-up: Return in about 3 months (around 11/03/2019).    Vevelyn Francois, NP

## 2019-08-07 LAB — COMP. METABOLIC PANEL (12)
AST: 19 IU/L (ref 0–40)
Albumin/Globulin Ratio: 1.1 — ABNORMAL LOW (ref 1.2–2.2)
Albumin: 3.9 g/dL (ref 3.8–4.8)
Alkaline Phosphatase: 101 IU/L (ref 39–117)
BUN/Creatinine Ratio: 6 — ABNORMAL LOW (ref 9–23)
BUN: 5 mg/dL — ABNORMAL LOW (ref 6–24)
Bilirubin Total: 0.2 mg/dL (ref 0.0–1.2)
Calcium: 9.1 mg/dL (ref 8.7–10.2)
Chloride: 107 mmol/L — ABNORMAL HIGH (ref 96–106)
Creatinine, Ser: 0.85 mg/dL (ref 0.57–1.00)
GFR calc Af Amer: 96 mL/min/{1.73_m2} (ref >59)
GFR calc non Af Amer: 84 mL/min/{1.73_m2} (ref >59)
Globulin, Total: 3.5 g/dL (ref 1.5–4.5)
Glucose: 88 mg/dL (ref 65–99)
Potassium: 4.1 mmol/L (ref 3.5–5.2)
Sodium: 142 mmol/L (ref 134–144)
Total Protein: 7.4 g/dL (ref 6.0–8.5)

## 2019-08-07 LAB — C-REACTIVE PROTEIN: CRP: 11 mg/L — ABNORMAL HIGH (ref 0–10)

## 2019-08-07 LAB — CBC WITH DIFFERENTIAL/PLATELET
Basophils Absolute: 0 10*3/uL (ref 0.0–0.2)
Basos: 1 %
EOS (ABSOLUTE): 0.1 10*3/uL (ref 0.0–0.4)
Eos: 2 %
Hematocrit: 34.5 % (ref 34.0–46.6)
Hemoglobin: 11.6 g/dL (ref 11.1–15.9)
Immature Grans (Abs): 0 10*3/uL (ref 0.0–0.1)
Immature Granulocytes: 0 %
Lymphocytes Absolute: 3.5 10*3/uL — ABNORMAL HIGH (ref 0.7–3.1)
Lymphs: 42 %
MCH: 27.4 pg (ref 26.6–33.0)
MCHC: 33.6 g/dL (ref 31.5–35.7)
MCV: 81 fL (ref 79–97)
Monocytes Absolute: 0.5 10*3/uL (ref 0.1–0.9)
Monocytes: 6 %
Neutrophils Absolute: 4.1 10*3/uL (ref 1.4–7.0)
Neutrophils: 49 %
Platelets: 376 10*3/uL (ref 150–450)
RBC: 4.24 x10E6/uL (ref 3.77–5.28)
RDW: 17.5 % — ABNORMAL HIGH (ref 11.7–15.4)
WBC: 8.2 10*3/uL (ref 3.4–10.8)

## 2019-08-07 LAB — MAGNESIUM: Magnesium: 2.2 mg/dL (ref 1.6–2.3)

## 2019-08-07 LAB — SEDIMENTATION RATE: Sed Rate: 66 mm/hr — ABNORMAL HIGH (ref 0–32)

## 2019-08-07 LAB — VITAMIN D 25 HYDROXY (VIT D DEFICIENCY, FRACTURES): Vit D, 25-Hydroxy: 27.3 ng/mL — ABNORMAL LOW (ref 30.0–100.0)

## 2019-08-07 LAB — VITAMIN B12: Vitamin B-12: 354 pg/mL (ref 232–1245)

## 2019-08-07 LAB — STD SCREEN (8)
HIV Screen 4th Generation wRfx: NONREACTIVE
HSV 1 Glycoprotein G Ab, IgG: 2.48 index — ABNORMAL HIGH (ref 0.00–0.90)
HSV 2 IgG, Type Spec: >23.6 index — ABNORMAL HIGH (ref 0.00–0.90)
Hep A IgM: NEGATIVE
Hep B C IgM: POSITIVE — AB
Hep C Virus Ab: 0.2 s/co ratio (ref 0.0–0.9)
Hepatitis B Surface Ag: NEGATIVE
RPR Ser Ql: NONREACTIVE

## 2019-08-07 LAB — TSH: TSH: 2.05 u[IU]/mL (ref 0.450–4.500)

## 2019-08-08 ENCOUNTER — Telehealth: Payer: Self-pay | Admitting: Family Medicine

## 2019-08-08 LAB — CHLAMYDIA/GONOCOCCUS/TRICHOMONAS, NAA
Chlamydia by NAA: NEGATIVE
Gonococcus by NAA: NEGATIVE
Trich vag by NAA: NEGATIVE

## 2019-08-08 NOTE — Telephone Encounter (Signed)
Crystal,  Can you please contact her regarding labs?

## 2019-09-11 ENCOUNTER — Ambulatory Visit: Payer: 59 | Attending: Family

## 2019-09-11 DIAGNOSIS — Z23 Encounter for immunization: Secondary | ICD-10-CM

## 2019-09-11 NOTE — Progress Notes (Signed)
   Covid-19 Vaccination Clinic  Name:  Kathryn Stevenson    MRN: [id number] DOB: 1976/06/08  09/11/2019  Kathryn Stevenson was observed post Covid-19 immunization for 15 minutes without incident. She was provided with Vaccine Information Sheet and instruction to access the V-Safe system.   Kathryn Stevenson was instructed to call 911 with any severe reactions post vaccine: Marland Kitchen Difficulty breathing  . Swelling of face and throat  . A fast heartbeat  . A bad rash all over body  . Dizziness and weakness   Immunizations Administered    Name Date Dose VIS Date Route   Moderna COVID-19 Vaccine 09/11/2019  1:03 PM 0.5 mL 05/20/2019 Intramuscular   Manufacturer: Moderna   Lot: [id number]   [id number]

## 2019-09-12 ENCOUNTER — Ambulatory Visit
Admission: RE | Admit: 2019-09-12 | Discharge: 2019-09-12 | Disposition: A | Discharge status: Home / Self Care | Payer: 59 | Source: Ambulatory Visit | Attending: Nurse Practitioner | Admitting: Nurse Practitioner

## 2019-09-12 ENCOUNTER — Other Ambulatory Visit: Payer: Self-pay

## 2019-09-12 DIAGNOSIS — Z0000 Encounter for general adult medical examination without abnormal findings: Secondary | ICD-10-CM

## 2019-10-14 ENCOUNTER — Ambulatory Visit: Payer: 59 | Attending: Family

## 2019-10-14 DIAGNOSIS — Z23 Encounter for immunization: Secondary | ICD-10-CM

## 2019-10-14 NOTE — Progress Notes (Signed)
   Covid-19 Vaccination Clinic  Name:  Kathryn Stevenson    MRN: [id number] DOB: 12/11/1975  10/14/2019  [name] was observed post Covid-19 immunization for 15 minutes without incident. She was provided with Vaccine Information Sheet and instruction to access the V-Safe system.   [name] was instructed to call 911 with any severe reactions post vaccine: Marland Kitchen Difficulty breathing  . Swelling of face and throat  . A fast heartbeat  . A bad rash all over body  . Dizziness and weakness   Immunizations Administered    Name Date Dose VIS Date Route   Moderna COVID-19 Vaccine 10/14/2019  1:07 PM 0.5 mL 05/2019 Intramuscular   Manufacturer: Moderna   Lot: [id number]   [id number]

## 2019-11-05 ENCOUNTER — Ambulatory Visit: Payer: 59 | Admitting: Nurse Practitioner

## 2019-11-12 ENCOUNTER — Other Ambulatory Visit: Payer: Self-pay

## 2019-11-12 ENCOUNTER — Ambulatory Visit (INDEPENDENT_AMBULATORY_CARE_PROVIDER_SITE_OTHER): Payer: 59 | Admitting: Nurse Practitioner

## 2019-11-12 DIAGNOSIS — R300 Dysuria: Secondary | ICD-10-CM

## 2019-11-12 MED ORDER — NITROFURANTOIN MONOHYD MACRO 100 MG PO CAPS: 100.0000 mg | ORAL_CAPSULE | Freq: Two times a day (BID) | ORAL | 0 refills | Status: AC

## 2019-11-12 NOTE — Patient Instructions (Signed)
Urinary Tract Infection, Adult A urinary tract infection (UTI) is an infection of any part of the urinary tract. The urinary tract includes:  The kidneys.  The ureters.  The bladder.  The urethra. These organs make, store, and get rid of pee (urine) in the body. What are the causes? This is caused by germs (bacteria) in your genital area. These germs grow and cause swelling (inflammation) of your urinary tract. What increases the risk? You are more likely to develop this condition if:  You have a small, thin tube (catheter) to drain pee.  You cannot control when you pee or poop (incontinence).  You are female, and: ? You use these methods to prevent pregnancy:  A medicine that kills sperm (spermicide).  A device that blocks sperm (diaphragm). ? You have low levels of a female hormone (estrogen). ? You are pregnant.  You have genes that add to your risk.  You are sexually active.  You take antibiotic medicines.  You have trouble peeing because of: ? A prostate that is bigger than normal, if you are female. ? A blockage in the part of your body that drains pee from the bladder (urethra). ? A kidney stone. ? A nerve condition that affects your bladder (neurogenic bladder). ? Not getting enough to drink. ? Not peeing often enough.  You have other conditions, such as: ? Diabetes. ? A weak disease-fighting system (immune system). ? Sickle cell disease. ? Gout. ? Injury of the spine. What are the signs or symptoms? Symptoms of this condition include:  Needing to pee right away (urgently).  Peeing often.  Peeing small amounts often.  Pain or burning when peeing.  Blood in the pee.  Pee that smells bad or not like normal.  Trouble peeing.  Pee that is cloudy.  Fluid coming from the vagina, if you are female.  Pain in the belly or lower back. Other symptoms include:  Throwing up (vomiting).  No urge to eat.  Feeling mixed up (confused).  Being tired  and grouchy (irritable).  A fever.  Watery poop (diarrhea). How is this treated? This condition may be treated with:  Antibiotic medicine.  Other medicines.  Drinking enough water. Follow these instructions at home:  Medicines  Take over-the-counter and prescription medicines only as told by your doctor.  If you were prescribed an antibiotic medicine, take it as told by your doctor. Do not stop taking it even if you start to feel better. General instructions  Make sure you: ? Pee until your bladder is empty. ? Do not hold pee for a long time. ? Empty your bladder after sex. ? Wipe from front to back after pooping if you are a female. Use each tissue one time when you wipe.  Drink enough fluid to keep your pee pale yellow.  Keep all follow-up visits as told by your doctor. This is important. Contact a doctor if:  You do not get better after 1-2 days.  Your symptoms go away and then come back. Get help right away if:  You have very bad back pain.  You have very bad pain in your lower belly.  You have a fever.  You are sick to your stomach (nauseous).  You are throwing up. Summary  A urinary tract infection (UTI) is an infection of any part of the urinary tract.  This condition is caused by germs in your genital area.  There are many risk factors for a UTI. These include having a small, thin   tube to drain pee and not being able to control when you pee or poop.  Treatment includes antibiotic medicines for germs.  Drink enough fluid to keep your pee pale yellow. This information is not intended to replace advice given to you by your health care provider. Make sure you discuss any questions you have with your health care provider. Document Revised: 05/23/2018 Document Reviewed: 12/13/2017 Elsevier Patient Education  2020 Elsevier Inc.  

## 2019-11-12 NOTE — Progress Notes (Signed)
.Virtual Visit via Telephone Note  I connected with Kathryn Stevenson on 11/12/19 at 10:20 AM EDT by telephone and verified that I am speaking with the correct person using two identifiers.   I discussed the limitations, risks, security and privacy concerns of performing an evaluation and management service by telephone and the availability of in person appointments. I also discussed with the patient that there may be a patient responsible charge related to this service. The patient expressed understanding and agreed to proceed.   History of Present Illness:  She  has a past medical history of Acid reflux, Dermatitis, Fracture, ankle, GERD (gastroesophageal reflux disease), Hepatic hemangioma, Hepatitis B, Malignant neoplasm of head of pancreas ([place]), Morbid obesity ([place]), and Pancreatic lesion (06/2018).    Urinary Tract Infection Patient complains of burning with urination. She has had symptoms for 1 day. Patient denies back pain, congestion, cough, fever, headache, rhinitis, sorethroat, stomach ache and vaginal discharge. Patient does have a history of recurrent UTI. Patient does not have a history of pyelonephritis.     Observations/Objective: Patient able to speak in full sentences in no distress. She was out walking her dog while on break from working at home.   Assessment and Plan: Assessment  Primary Diagnosis & Pertinent Problem List: The encounter diagnosis was Burning with urination.  Visit Diagnosis: 1. Burning with urination     Follow Up Instructions:  Plan of Care  Pharmacotherapy (Medications Ordered): Meds ordered this encounter  Medications  . nitrofurantoin, macrocrystal-monohydrate, (MACROBID) 100 MG capsule    Sig: Take 1 capsule (100 mg total) by mouth 2 (two) times daily for 5 days.    Dispense:  10 capsule    Refill:  0    Order Specific Question:   Supervising Provider    Answer:   Tresa Garter [id number]   New Prescriptions   NITROFURANTOIN,  MACROCRYSTAL-MONOHYDRATE, (MACROBID) 100 MG CAPSULE    Take 1 capsule (100 mg total) by mouth 2 (two) times daily for 5 days.   Provider-requested follow-up: Return in about 4 weeks (around 12/10/2019).  Future Appointments  Date Time Provider [place]  12/10/2019  9:00 AM Vevelyn Francois, NP [place] None    Note by: Vevelyn Francois NP Date: 11/12/2019; Time: 11:14 AM  Patient instructions provided during this appointment: Patient Instructions  Urinary Tract Infection, Adult A urinary tract infection (UTI) is an infection of any part of the urinary tract. The urinary tract includes:  The kidneys.  The ureters.  The bladder.  The urethra. These organs make, store, and get rid of pee (urine) in the body. What are the causes? This is caused by germs (bacteria) in your genital area. These germs grow and cause swelling (inflammation) of your urinary tract. What increases the risk? You are more likely to develop this condition if:  You have a small, thin tube (catheter) to drain pee.  You cannot control when you pee or poop (incontinence).  You are female, and: ? You use these methods to prevent pregnancy:  A medicine that kills sperm (spermicide).  A device that blocks sperm (diaphragm). ? You have low levels of a female hormone (estrogen). ? You are pregnant.  You have genes that add to your risk.  You are sexually active.  You take antibiotic medicines.  You have trouble peeing because of: ? A prostate that is bigger than normal, if you are female. ? A blockage in the part of your body that drains pee from the  bladder (urethra). ? A kidney stone. ? A nerve condition that affects your bladder (neurogenic bladder). ? Not getting enough to drink. ? Not peeing often enough.  You have other conditions, such as: ? Diabetes. ? A weak disease-fighting system (immune system). ? Sickle cell disease. ? Gout. ? Injury of the spine. What are the signs or  symptoms? Symptoms of this condition include:  Needing to pee right away (urgently).  Peeing often.  Peeing small amounts often.  Pain or burning when peeing.  Blood in the pee.  Pee that smells bad or not like normal.  Trouble peeing.  Pee that is cloudy.  Fluid coming from the vagina, if you are female.  Pain in the belly or lower back. Other symptoms include:  Throwing up (vomiting).  No urge to eat.  Feeling mixed up (confused).  Being tired and grouchy (irritable).  A fever.  Watery poop (diarrhea). How is this treated? This condition may be treated with:  Antibiotic medicine.  Other medicines.  Drinking enough water. Follow these instructions at home:  Medicines  Take over-the-counter and prescription medicines only as told by your doctor.  If you were prescribed an antibiotic medicine, take it as told by your doctor. Do not stop taking it even if you start to feel better. General instructions  Make sure you: ? Pee until your bladder is empty. ? Do not hold pee for a long time. ? Empty your bladder after sex. ? Wipe from front to back after pooping if you are a female. Use each tissue one time when you wipe.  Drink enough fluid to keep your pee pale yellow.  Keep all follow-up visits as told by your doctor. This is important. Contact a doctor if:  You do not get better after 1-2 days.  Your symptoms go away and then come back. Get help right away if:  You have very bad back pain.  You have very bad pain in your lower belly.  You have a fever.  You are sick to your stomach (nauseous).  You are throwing up. Summary  A urinary tract infection (UTI) is an infection of any part of the urinary tract.  This condition is caused by germs in your genital area.  There are many risk factors for a UTI. These include having a small, thin tube to drain pee and not being able to control when you pee or poop.  Treatment includes antibiotic  medicines for germs.  Drink enough fluid to keep your pee pale yellow. This information is not intended to replace advice given to you by your health care provider. Make sure you discuss any questions you have with your health care provider. Document Revised: 05/23/2018 Document Reviewed: 12/13/2017 Elsevier Patient Education  [place].     I discussed the assessment and treatment plan with the patient. The patient was provided an opportunity to ask questions and all were answered. The patient agreed with the plan and demonstrated an understanding of the instructions.   The patient was advised to call back or seek an in-person evaluation if the symptoms worsen or if the condition fails to improve as anticipated.  I provided 7 minutes of non-face-to-face time during this encounter.   Vevelyn Francois, NP

## 2019-11-12 NOTE — Progress Notes (Signed)
This is a tele-visit.  Patient is having pain when she urinates.  Patient describes a discomfort more than pain.  This pain start on yesterday.

## 2019-12-03 ENCOUNTER — Ambulatory Visit (INDEPENDENT_AMBULATORY_CARE_PROVIDER_SITE_OTHER): Payer: 59 | Admitting: Nurse Practitioner

## 2019-12-03 ENCOUNTER — Encounter: Payer: Self-pay | Admitting: Nurse Practitioner

## 2019-12-03 ENCOUNTER — Other Ambulatory Visit: Payer: Self-pay

## 2019-12-03 VITALS — BP 118/77 | HR 59 | Temp 97.2°F | Ht 66.0 in | Wt 288.4 lb

## 2019-12-03 DIAGNOSIS — C250 Malignant neoplasm of head of pancreas: Secondary | ICD-10-CM

## 2019-12-03 DIAGNOSIS — R300 Dysuria: Secondary | ICD-10-CM

## 2019-12-03 DIAGNOSIS — K219 Gastro-esophageal reflux disease without esophagitis: Secondary | ICD-10-CM | POA: Diagnosis not present

## 2019-12-03 DIAGNOSIS — F32A Depression, unspecified: Secondary | ICD-10-CM

## 2019-12-03 DIAGNOSIS — N760 Acute vaginitis: Secondary | ICD-10-CM

## 2019-12-03 DIAGNOSIS — F329 Major depressive disorder, single episode, unspecified: Secondary | ICD-10-CM

## 2019-12-03 LAB — POCT URINALYSIS DIPSTICK
Bilirubin, UA: NEGATIVE
Glucose, UA: POSITIVE — AB
Ketones, UA: NEGATIVE
Leukocytes, UA: NEGATIVE
Nitrite, UA: POSITIVE
Protein, UA: POSITIVE — AB
Spec Grav, UA: >=1.03 — AB (ref 1.010–1.025)
Urobilinogen, UA: 1 E.U./dL
pH, UA: 5 (ref 5.0–8.0)

## 2019-12-03 MED ORDER — METRONIDAZOLE 500 MG PO TABS: 500.0000 mg | ORAL_TABLET | Freq: Three times a day (TID) | ORAL | 0 refills | Status: DC

## 2019-12-03 MED ORDER — BUPROPION HCL ER (XL) 150 MG PO TB24: 150.0000 mg | ORAL_TABLET | Freq: Every day | ORAL | 0 refills | Status: DC

## 2019-12-03 NOTE — Progress Notes (Signed)
[place], [place]  [address] Phone:  [phone]   Fax:  [phone]   Established Patient Office Visit  Subjective:  Patient ID: Kathryn Stevenson, female    DOB: 29-Apr-1976  Age: 44 y.o. MRN: [id number]  CC:  Chief Complaint  Patient presents with  . Follow-up    burning with ua , started about 1 week ago , just finish meds for uti     HPI Kathryn Stevenson presents for follow up. She  has a past medical history of Acid reflux, Dermatitis, Fracture, ankle, GERD (gastroesophageal reflux disease), Hepatic hemangioma, Hepatitis B, Malignant neoplasm of head of pancreas ([place]), Morbid obesity ([place]), and Pancreatic lesion (06/2018).      She continues to have weight loss. Onset was several months ago. Patient has lost approximately 64 pounds in last 7 months. She feels ideal weight is 200 pounds. History of eating disorders: none. Patient is exercising ; walking the dog daily. She admits that she going to start back doing more exercise. Factors associated with weight loss:dietary changes, increased hydration with water; loose stools 4-5 timed daily. She has been battling some sadness with the loss of a dear friend. Patient denies: abdominal pain, binge/ purge behaviors, constipation, diarrhea, heat intolerance, poor appetite and unexplained fatigue.   Urinary Tract Infection Patient complains of irriation post void. . She has had symptoms for several days.  Patient denies back pain, congestion, cough, fever, headache, rhinitis, sorethroat, stomach ache and vaginal discharge. Patient does not have a history of recurrent UTI. Patient does not have a history of pyelonephritis. She has a history of recurrent vaginosis however she has made some changes and had been doing well. She does amdit to trying a some new produces ; lavender soaps etc.  She was recently treated for UTI via televisit with Macrobid BID.     Past Medical History:  Diagnosis Date  .  Acid reflux   . Dermatitis   . Fracture, ankle   . GERD (gastroesophageal reflux disease)   . Hepatic hemangioma   . Hepatitis B   . Malignant neoplasm of head of pancreas ([place])   . Morbid obesity ([place])   . Pancreatic lesion 06/2018   seen on MR of the liver     Past Surgical History:  Procedure Laterality Date  . ANKLE SURGERY Left   . BIOPSY  01/15/2019   Procedure: BIOPSY;  Surgeon: Rush Landmark Telford Nab., MD;  Location: [facility];  Service: Gastroenterology;;  . ESOPHAGOGASTRODUODENOSCOPY (EGD) WITH PROPOFOL N/A 01/15/2019   Procedure: ESOPHAGOGASTRODUODENOSCOPY (EGD) WITH PROPOFOL;  Surgeon: Irving Copas., MD;  Location: [facility];  Service: Gastroenterology;  Laterality: N/A;  . FINE NEEDLE ASPIRATION  01/15/2019   Procedure: FINE NEEDLE ASPIRATION (FNA) LINEAR;  Surgeon: Irving Copas., MD;  Location: [facility];  Service: Gastroenterology;;  . INDUCED ABORTION    . MOUTH SURGERY    . TUBAL LIGATION    . UPPER ESOPHAGEAL ENDOSCOPIC ULTRASOUND (EUS) N/A 01/15/2019   Procedure: UPPER ESOPHAGEAL ENDOSCOPIC ULTRASOUND (EUS);  Surgeon: Irving Copas., MD;  Location: [facility];  Service: Gastroenterology;  Laterality: N/A;  . WHIPPLE PROCEDURE N/A 04/15/2019   Procedure: WHIPPLE PROCEDURE;  Surgeon: Stark Klein, MD;  Location: [facility] OR;  Service: General;  Laterality: N/A;  EPIDURAL    Family History  Problem Relation Age of Onset  . Hypertension Mother   . Ovarian cancer Maternal Grandmother   . Heart attack Maternal Grandmother   .  Esophageal cancer Neg Hx   . Colon cancer Neg Hx   . Pancreatic cancer Neg Hx   . Stomach cancer Neg Hx   . Liver disease Neg Hx     Social History   Socioeconomic History  . Marital status: Single    Spouse name: Not on file  . Number of children: Not on file  . Years of education: Not on file  . Highest education level: Not on file  Occupational History  . Not on file  Tobacco Use  . Smoking  status: Former Smoker    Packs/day: 0.50    Types: Cigarettes    Quit date: 12/11/2016    Years since quitting: 2.9  . Smokeless tobacco: Never Used  Vaping Use  . Vaping Use: Never used  Substance and Sexual Activity  . Alcohol use: Yes    Comment: occasionally  . Drug use: No  . Sexual activity: Yes    Partners: Male    Birth control/protection: Surgical  Other Topics Concern  . Not on file  Social History Narrative  . Not on file   Social Determinants of Health   Financial Resource Strain:   . Difficulty of Paying Living Expenses:   Food Insecurity:   . Worried About Charity fundraiser in the Last Year:   . Arboriculturist in the Last Year:   Transportation Needs:   . Film/video editor (Medical):   Marland Kitchen Lack of Transportation (Non-Medical):   Physical Activity:   . Days of Exercise per Week:   . Minutes of Exercise per Session:   Stress:   . Feeling of Stress :   Social Connections:   . Frequency of Communication with Friends and Family:   . Frequency of Social Gatherings with Friends and Family:   . Attends Religious Services:   . Active Member of Clubs or Organizations:   . Attends Archivist Meetings:   Marland Kitchen Marital Status:   Intimate Partner Violence:   . Fear of Current or Ex-Partner:   . Emotionally Abused:   Marland Kitchen Physically Abused:   . Sexually Abused:     Outpatient Medications Prior to Visit  Medication Sig Dispense Refill  . hydrochlorothiazide (HYDRODIURIL) 12.5 MG tablet Take 1 tablet (12.5 mg total) by mouth daily. 90 tablet 3  . lipase/protease/amylase (CREON) 12000 units CPEP capsule Take by mouth.    . multivitamin-iron-minerals-folic acid (CENTRUM) chewable tablet Chew 1 tablet by mouth daily.    Marland Kitchen buPROPion (WELLBUTRIN XL) 150 MG 24 hr tablet Take 1 tablet (150 mg total) by mouth daily. 90 tablet 0  . promethazine (PHENERGAN) 12.5 MG tablet Take 12.5 mg by mouth every 6 (six) hours as needed.     No facility-administered medications  prior to visit.    No Known Allergies  ROS Review of Systems    Objective:    Physical Exam Constitutional:      General: She is not in acute distress.    Appearance: She is obese. She is not ill-appearing or toxic-appearing.  HENT:     Head: Normocephalic and atraumatic.     Nose: Nose normal.     Mouth/Throat:     Mouth: Mucous membranes are moist.  Eyes:     Pupils: Pupils are equal, round, and reactive to light.  Cardiovascular:     Rate and Rhythm: Normal rate and regular rhythm.     Pulses: Normal pulses.     Heart sounds: Normal heart sounds.  Pulmonary:     Effort: Pulmonary effort is normal.     Breath sounds: Normal breath sounds.  Abdominal:     Tenderness: There is abdominal tenderness. There is no right CVA tenderness or left CVA tenderness.     Comments: hypoactive  Musculoskeletal:        General: Normal range of motion.     Cervical back: Normal range of motion.     Comments: Well healed scar to left ankle  Skin:    General: Skin is warm and dry.     Capillary Refill: Capillary refill takes less than 2 seconds.  Neurological:     Mental Status: She is alert and oriented to person, place, and time.  Psychiatric:        Mood and Affect: Mood normal.        Behavior: Behavior normal.        Thought Content: Thought content normal.        Judgment: Judgment normal.     BP 118/77 (BP Location: Left Arm, Patient Position: Sitting, Cuff Size: Large)   Pulse (!) 59   Temp (!) 97.2 F (36.2 C) (Temporal)   Ht 5\' 6"  (1.676 m)   Wt 288 lb 6.4 oz (130.8 kg)   LMP 11/29/2019   SpO2 100%   BMI 46.55 kg/m  Wt Readings from Last 3 Encounters:  12/03/19 288 lb 6.4 oz (130.8 kg)  08/06/19 (!) 313 lb (142 kg)  05/16/19 (!) 356 lb 7.7 oz (161.7 kg)     Health Maintenance Due  Topic Date Due  . PAP SMEAR-Modifier  12/09/2019    There are no preventive care reminders to display for this patient.  Lab Results  Component Value Date   TSH 2.050  08/06/2019   Lab Results  Component Value Date   WBC 8.2 08/06/2019   HGB 11.6 08/06/2019   HCT 34.5 08/06/2019   MCV 81 08/06/2019   PLT 376 08/06/2019   Lab Results  Component Value Date   NA 142 08/06/2019   K 4.1 08/06/2019   CO2 28 05/16/2019   GLUCOSE 88 08/06/2019   BUN 5 (L) 08/06/2019   CREATININE 0.85 08/06/2019   BILITOT 0.2 08/06/2019   ALKPHOS 101 08/06/2019   AST 19 08/06/2019   ALT 84 (H) 04/23/2019   PROT 7.4 08/06/2019   ALBUMIN 3.9 08/06/2019   CALCIUM 9.1 08/06/2019   ANIONGAP 12 05/16/2019   Lab Results  Component Value Date   CHOL 160 09/20/2015   Lab Results  Component Value Date   HDL 59 09/20/2015   Lab Results  Component Value Date   LDLCALC 81 09/20/2015   Lab Results  Component Value Date   TRIG 100 09/20/2015   Lab Results  Component Value Date   CHOLHDL 2.7 09/20/2015   Lab Results  Component Value Date   HGBA1C 5.4 04/09/2019      Assessment & Plan:   Problem List Items Addressed This Visit      Digestive   Pancreatic cancer ([place])   Whipple procedure 04/15/2019 no follow up at this time.       Gastroesophageal reflux disease Improved     Other   Morbid obesity ([place]) Weight loss over 75 pounds per patient since surgery 03/2019 Continue with lifestyle modification Will continue 3 mon follow with full lab panel next visit.    Other Visit Diagnoses    Burning with urination    -  Primary   Relevant Orders  POCT Urinalysis Dipstick (Completed)   NuSwab Vaginitis (VG) Urine culture pending   Vaginosis       Relevant Medications   metroNIDAZOLE (FLAGYL) 500 MG tablet Empiric treatment due to history and Macrobid not completely effective.    Other Relevant Orders   NuSwab Vaginitis (VG)  Depression  Will continue with Wellbutrin 150mg  QOD  Will continue to monitor     Meds ordered this encounter  Medications  . metroNIDAZOLE (FLAGYL) 500 MG tablet    Sig: Take 1 tablet (500 mg total) by mouth 3 (three)  times daily.    Dispense:  21 tablet    Refill:  0    Order Specific Question:   Supervising Provider    Answer:   Tresa Garter [id number]  . buPROPion (WELLBUTRIN XL) 150 MG 24 hr tablet    Sig: Take 1 tablet (150 mg total) by mouth daily.    Dispense:  90 tablet    Refill:  0    Order Specific Question:   Supervising Provider    Answer:   Tresa Garter [[id number]]    Follow-up: Return in about 3 months (around 03/04/2020).    Vevelyn Francois, NP

## 2019-12-03 NOTE — Patient Instructions (Signed)
Vaginitis Vaginitis is a condition in which the vaginal tissue swells and becomes red (inflamed). This condition is most often caused by a change in the normal balance of bacteria and yeast that live in the vagina. This change causes an overgrowth of certain bacteria or yeast, which causes the inflammation. There are different types of vaginitis, but the most common types are:  Bacterial vaginosis.  Yeast infection (candidiasis).  Trichomoniasis vaginitis. This is a sexually transmitted disease (STD).  Viral vaginitis.  Atrophic vaginitis.  Allergic vaginitis. What are the causes? The cause of this condition depends on the type of vaginitis. It can be caused by:  Bacteria (bacterial vaginosis).  Yeast, which is a fungus (yeast infection).  A parasite (trichomoniasis vaginitis).  A virus (viral vaginitis).  Low hormone levels (atrophic vaginitis). Low hormone levels can occur during pregnancy, breastfeeding, or after menopause.  Irritants, such as bubble baths, scented tampons, and feminine sprays (allergic vaginitis). Other factors can change the normal balance of the yeast and bacteria that live in the vagina. These include:  Antibiotic medicines.  Poor hygiene.  Diaphragms, vaginal sponges, spermicides, birth control pills, and intrauterine devices (IUD).  Sex.  Infection.  Uncontrolled diabetes.  A weakened defense (immune) system. What increases the risk? This condition is more likely to develop in women who:  Smoke.  Use vaginal douches, scented tampons, or scented sanitary pads.  Wear tight-fitting pants.  Wear thong underwear.  Use oral birth control pills or an IUD.  Have sex without a condom.  Have multiple sex partners.  Have an STD.  Frequently use the spermicide nonoxynol-9.  Eat lots of foods high in sugar.  Have uncontrolled diabetes.  Have low estrogen levels.  Have a weakened immune system from an immune disorder or medical  treatment.  Are pregnant or breastfeeding. What are the signs or symptoms? Symptoms vary depending on the cause of the vaginitis. Common symptoms include:  Abnormal vaginal discharge. ? The discharge is white, gray, or yellow with bacterial vaginosis. ? The discharge is thick, white, and cheesy with a yeast infection. ? The discharge is frothy and yellow or greenish with trichomoniasis.  A bad vaginal smell. The smell is fishy with bacterial vaginosis.  Vaginal itching, pain, or swelling.  Sex that is painful.  Pain or burning when urinating. Sometimes there are no symptoms. How is this diagnosed? This condition is diagnosed based on your symptoms and medical history. A physical exam, including a pelvic exam, will also be done. You may also have other tests, including:  Tests to determine the pH level (acidity or alkalinity) of your vagina.  A whiff test, to assess the odor that results when a sample of your vaginal discharge is mixed with a potassium hydroxide solution.  Tests of vaginal fluid. A sample will be examined under a microscope. How is this treated? Treatment varies depending on the type of vaginitis you have. Your treatment may include:  Antibiotic creams or pills to treat bacterial vaginosis and trichomoniasis.  Antifungal medicines, such as vaginal creams or suppositories, to treat a yeast infection.  Medicine to ease discomfort if you have viral vaginitis. Your sexual partner should also be treated.  Estrogen delivered in a cream, pill, suppository, or vaginal ring to treat atrophic vaginitis. If vaginal dryness occurs, lubricants and moisturizing creams may help. You may need to avoid scented soaps, sprays, or douches.  Stopping use of a product that is causing allergic vaginitis. Then using a vaginal cream to treat the symptoms. Follow   these instructions at home: Lifestyle  Keep your genital area clean and dry. Avoid soap, and only rinse the area with  water.  Do not douche or use tampons until your health care provider says it is okay to do so. Use sanitary pads, if needed.  Do not have sex until your health care provider approves. When you can return to sex, practice safe sex and use condoms.  Wipe from front to back. This avoids the spread of bacteria from the rectum to the vagina. General instructions  Take over-the-counter and prescription medicines only as told by your health care provider.  If you were prescribed an antibiotic medicine, take or use it as told by your health care provider. Do not stop taking or using the antibiotic even if you start to feel better.  Keep all follow-up visits as told by your health care provider. This is important. How is this prevented?  Use mild, non-scented products. Do not use things that can irritate the vagina, such as fabric softeners. Avoid the following products if they are scented: ? Feminine sprays. ? Detergents. ? Tampons. ? Feminine hygiene products. ? Soaps or bubble baths.  Let air reach your genital area. ? Wear cotton underwear to reduce moisture buildup. ? Avoid wearing underwear while you sleep. ? Avoid wearing tight pants and underwear or nylons without a cotton panel. ? Avoid wearing thong underwear.  Take off any wet clothing, such as bathing suits, as soon as possible.  Practice safe sex and use condoms. Contact a health care provider if:  You have abdominal pain.  You have a fever.  You have symptoms that last for more than 2-3 days. Get help right away if:  You have a fever and your symptoms suddenly get worse. Summary  Vaginitis is a condition in which the vaginal tissue becomes inflamed.This condition is most often caused by a change in the normal balance of bacteria and yeast that live in the vagina.  Treatment varies depending on the type of vaginitis you have.  Do not douche, use tampons , or have sex until your health care provider approves. When  you can return to sex, practice safe sex and use condoms. This information is not intended to replace advice given to you by your health care provider. Make sure you discuss any questions you have with your health care provider. Document Revised: 05/18/2017 Document Reviewed: 07/11/2016 Elsevier Patient Education  2020 Elsevier Inc.  

## 2019-12-05 LAB — URINE CULTURE

## 2019-12-08 LAB — NUSWAB VAGINITIS (VG)
Candida albicans, NAA: NEGATIVE
Candida glabrata, NAA: NEGATIVE
Trich vag by NAA: NEGATIVE

## 2019-12-10 ENCOUNTER — Ambulatory Visit: Payer: 59 | Admitting: Nurse Practitioner

## 2020-02-17 ENCOUNTER — Other Ambulatory Visit: Payer: Self-pay | Admitting: Family Medicine

## 2020-02-17 DIAGNOSIS — R609 Edema, unspecified: Secondary | ICD-10-CM

## 2020-02-20 ENCOUNTER — Other Ambulatory Visit: Payer: Self-pay

## 2020-02-20 DIAGNOSIS — R609 Edema, unspecified: Secondary | ICD-10-CM

## 2020-02-20 MED ORDER — HYDROCHLOROTHIAZIDE 12.5 MG PO TABS: 12.5000 mg | ORAL_TABLET | Freq: Every day | ORAL | 3 refills | Status: DC

## 2020-02-24 ENCOUNTER — Other Ambulatory Visit: Payer: Self-pay

## 2020-02-24 DIAGNOSIS — N760 Acute vaginitis: Secondary | ICD-10-CM

## 2020-02-26 ENCOUNTER — Telehealth: Payer: Self-pay | Admitting: Nurse Practitioner

## 2020-02-26 NOTE — Telephone Encounter (Signed)
Please set her up for a virtual mychart visit thanks

## 2020-02-27 ENCOUNTER — Encounter: Payer: Self-pay | Admitting: Nurse Practitioner

## 2020-02-27 ENCOUNTER — Telehealth (INDEPENDENT_AMBULATORY_CARE_PROVIDER_SITE_OTHER): Payer: 59 | Admitting: Nurse Practitioner

## 2020-02-27 DIAGNOSIS — N949 Unspecified condition associated with female genital organs and menstrual cycle: Secondary | ICD-10-CM

## 2020-02-27 MED ORDER — METRONIDAZOLE 500 MG PO TABS: 500.0000 mg | ORAL_TABLET | Freq: Three times a day (TID) | ORAL | 0 refills | Status: DC

## 2020-02-27 NOTE — Patient Instructions (Signed)

## 2020-02-27 NOTE — Progress Notes (Signed)
   [address] [place], [place]  [address] Phone:  [phone]   Fax:  [phone] .Virtual Visit via Telephone Note  I connected with Kathryn Stevenson on 02/27/20 at  3:00 PM EDT by telephone and verified that I am speaking with the correct person using two identifiers.   I discussed the limitations, risks, security and privacy concerns of performing an evaluation and management service by telephone and the availability of in person appointments. I also discussed with the patient that there may be a patient responsible charge related to this service. The patient expressed understanding and agreed to proceed.  Patient home Provider Office  History of Present Illness:   Urinary Tract Infection Patient complains of burning with urination. She has had symptoms for a few day since Tuesday. Patient also complains of back pain and vaginal discharge. Patient denies congestion, cough, fever, headache, rhinitis, sorethroat and stomach ache. Patient does not have a history of recurrent UTI. Patient does not have a history of pyelonephritis. She has been using the AZO. She has also been using the vagisil.    Observations/Objective: Virtual visit  Assessment and Plan: Assessment  Primary Diagnosis & Pertinent Problem List: The encounter diagnosis was Vaginal discomfort.  Visit Diagnosis: 1. Vaginal discomfort     Follow Up Instructions: As scheduled   I discussed the assessment and treatment plan with the patient. The patient was provided an opportunity to ask questions and all were answered. The patient agreed with the plan and demonstrated an understanding of the instructions.   The patient was advised to call back or seek an in-person evaluation if the symptoms worsen or if the condition fails to improve as anticipated.  I provided 7 minutes of non-face-to-face time during this encounter.   Vevelyn Francois, NP

## 2020-03-02 NOTE — Telephone Encounter (Signed)
This patient was seen virtual on 02-27-2020, also follow up appointment is scheduled for 03-04-2020

## 2020-03-04 ENCOUNTER — Other Ambulatory Visit: Payer: Self-pay

## 2020-03-04 ENCOUNTER — Encounter: Payer: Self-pay | Admitting: Nurse Practitioner

## 2020-03-04 ENCOUNTER — Ambulatory Visit (INDEPENDENT_AMBULATORY_CARE_PROVIDER_SITE_OTHER): Payer: 59 | Admitting: Nurse Practitioner

## 2020-03-04 DIAGNOSIS — L659 Nonscarring hair loss, unspecified: Secondary | ICD-10-CM

## 2020-03-04 DIAGNOSIS — F32A Depression, unspecified: Secondary | ICD-10-CM

## 2020-03-04 DIAGNOSIS — F329 Major depressive disorder, single episode, unspecified: Secondary | ICD-10-CM | POA: Diagnosis not present

## 2020-03-04 DIAGNOSIS — Z113 Encounter for screening for infections with a predominantly sexual mode of transmission: Secondary | ICD-10-CM | POA: Diagnosis not present

## 2020-03-04 DIAGNOSIS — Z0000 Encounter for general adult medical examination without abnormal findings: Secondary | ICD-10-CM

## 2020-03-04 DIAGNOSIS — R82998 Other abnormal findings in urine: Secondary | ICD-10-CM

## 2020-03-04 LAB — POCT URINALYSIS DIPSTICK
Bilirubin, UA: NEGATIVE
Blood, UA: NEGATIVE
Glucose, UA: NEGATIVE
Ketones, UA: NEGATIVE
Nitrite, UA: NEGATIVE
Protein, UA: NEGATIVE
Spec Grav, UA: >=1.03 — AB (ref 1.010–1.025)
Urobilinogen, UA: 0.2 E.U./dL
pH, UA: 6 (ref 5.0–8.0)

## 2020-03-04 NOTE — Addendum Note (Signed)
Addended by: Vevelyn Francois on: 03/04/2020 01:30 PM   Modules accepted: Orders

## 2020-03-04 NOTE — Progress Notes (Addendum)
[place],   [address] Phone:  [phone]   Fax:  [phone]   Established Patient Office Visit  Subjective:  Patient ID: Kathryn Stevenson, female    DOB: 1975/09/10  Age: 44 y.o. MRN: [id number]  CC:  Chief Complaint  Patient presents with  . Follow-up    buring while urinating is much better than before    HPI Kathryn Stevenson presents for follow up. She  has a past medical history of Acid reflux, Dermatitis, Fracture, ankle, GERD (gastroesophageal reflux disease), Hepatic hemangioma, Hepatitis B, Malignant neoplasm of head of pancreas ([place]), Morbid obesity ([place]), and Pancreatic lesion (06/2018).   She is here today for follow-up.  She admits that she does not have much normal.  She was recently treated for dysuria.  She states that it has improved.  She would like STD testing.  She has a history of depression she is currently on Wellbutrin 150 mg.  She admits that she did have some deaths.  She has started drinking wine more.  She has occasional moments when her depression worse than others.  She does continue to work full-time and work is going well.  She is able to work from home which is a great benefit.  Her mother lives with her.  She has a dog that has brought joy into the family.  She denies any suicidal thoughts or ideations.  She is concerned with maintaining good health.  She is making better food choices.  She has lost 10 pounds since her last visit in July.  Overall she has lost 78 pounds since November and she is really encouraged.  Hair Loss She is having hair loss.  She feels like it has worsened. The hair loss is localized in distribution, and has been going on now several months. She is currently experiencing symptoms of hair breaking, hair shedding and hair thinning.   Past Medical History:  Diagnosis Date  . Acid reflux   . Dermatitis   . Fracture, ankle   . GERD (gastroesophageal reflux disease)   . Hepatic  hemangioma   . Hepatitis B   . 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 Morbid obesity ([place])   . Pancreatic lesion 06/2018   seen on MR of the liver     Past Surgical History:  Procedure Laterality Date  . ANKLE SURGERY Left   . BIOPSY  01/15/2019   Procedure: BIOPSY;  Surgeon: Rush Landmark Telford Nab., MD;  Location: [facility];  Service: Gastroenterology;;  . ESOPHAGOGASTRODUODENOSCOPY (EGD) WITH PROPOFOL N/A 01/15/2019   Procedure: ESOPHAGOGASTRODUODENOSCOPY (EGD) WITH PROPOFOL;  Surgeon: Irving Copas., MD;  Location: [facility];  Service: Gastroenterology;  Laterality: N/A;  . FINE NEEDLE ASPIRATION  01/15/2019   Procedure: FINE NEEDLE ASPIRATION (FNA) LINEAR;  Surgeon: Irving Copas., MD;  Location: [facility];  Service: Gastroenterology;;  . INDUCED ABORTION    . MOUTH SURGERY    . TUBAL LIGATION    . UPPER ESOPHAGEAL ENDOSCOPIC ULTRASOUND (EUS) N/A 01/15/2019   Procedure: UPPER ESOPHAGEAL ENDOSCOPIC ULTRASOUND (EUS);  Surgeon: Irving Copas., MD;  Location: [facility];  Service: Gastroenterology;  Laterality: N/A;  . WHIPPLE PROCEDURE N/A 04/15/2019   Procedure: WHIPPLE PROCEDURE;  Surgeon: Stark Klein, MD;  Location: [facility] OR;  Service: General;  Laterality: N/A;  EPIDURAL    Family History  Problem Relation Age of Onset  . Hypertension Mother   . Ovarian cancer Maternal Grandmother   .  Heart attack Maternal Grandmother   . Esophageal cancer Neg Hx   . Colon cancer Neg Hx   . Pancreatic cancer Neg Hx   . Stomach cancer Neg Hx   . Liver disease Neg Hx     Social History   Socioeconomic History  . Marital status: Single    Spouse name: Not on file  . Number of children: Not on file  . Years of education: Not on file  . Highest education level: Not on file  Occupational History  . Not on file  Tobacco Use  . Smoking status: Former Smoker    Packs/day: 0.50    Types: Cigarettes    Quit date: 12/11/2016    Years since  quitting: 3.2  . Smokeless tobacco: Never Used  Vaping Use  . Vaping Use: Never used  Substance and Sexual Activity  . Alcohol use: Yes    Comment: occasionally  . Drug use: No  . Sexual activity: Yes    Partners: Male    Birth control/protection: Surgical  Other Topics Concern  . Not on file  Social History Narrative  . Not on file   Social Determinants of Health   Financial Resource Strain:   . Difficulty of Paying Living Expenses: Not on file  Food Insecurity:   . Worried About Charity fundraiser in the Last Year: Not on file  . Ran Out of Food in the Last Year: Not on file  Transportation Needs:   . Lack of Transportation (Medical): Not on file  . Lack of Transportation (Non-Medical): Not on file  Physical Activity:   . Days of Exercise per Week: Not on file  . Minutes of Exercise per Session: Not on file  Stress:   . Feeling of Stress : Not on file  Social Connections:   . Frequency of Communication with Friends and Family: Not on file  . Frequency of Social Gatherings with Friends and Family: Not on file  . Attends Religious Services: Not on file  . Active Member of Clubs or Organizations: Not on file  . Attends Archivist Meetings: Not on file  . Marital Status: Not on file  Intimate Partner Violence:   . Fear of Current or Ex-Partner: Not on file  . Emotionally Abused: Not on file  . Physically Abused: Not on file  . Sexually Abused: Not on file    Outpatient Medications Prior to Visit  Medication Sig Dispense Refill  . hydrochlorothiazide (HYDRODIURIL) 12.5 MG tablet Take 1 tablet (12.5 mg total) by mouth daily. 90 tablet 3  . lipase/protease/amylase (CREON) 12000 units CPEP capsule Take by mouth.    . metroNIDAZOLE (FLAGYL) 500 MG tablet Take 1 tablet (500 mg total) by mouth 3 (three) times daily. 21 tablet 0  . multivitamin-iron-minerals-folic acid (CENTRUM) chewable tablet Chew 1 tablet by mouth daily.    . promethazine (PHENERGAN) 12.5 MG  tablet Take 12.5 mg by mouth every 6 (six) hours as needed.     Marland Kitchen buPROPion (WELLBUTRIN XL) 150 MG 24 hr tablet Take 1 tablet (150 mg total) by mouth daily. 90 tablet 0   No facility-administered medications prior to visit.    No Known Allergies  ROS Review of Systems  Gastrointestinal:       Soft stool that are orange in color      Objective:    Physical Exam Constitutional:      General: She is not in acute distress.    Appearance: She is obese.  She is not ill-appearing, toxic-appearing or diaphoretic.  HENT:     Head: Normocephalic and atraumatic.     Nose: Nose normal.     Mouth/Throat:     Mouth: Mucous membranes are moist.  Cardiovascular:     Rate and Rhythm: Normal rate and regular rhythm.     Pulses: Normal pulses.     Heart sounds: Normal heart sounds.  Pulmonary:     Effort: Pulmonary effort is normal.     Breath sounds: Normal breath sounds.  Abdominal:     Palpations: Abdomen is soft.  Musculoskeletal:        General: Normal range of motion.     Cervical back: Normal range of motion.  Skin:    General: Skin is warm and dry.     Capillary Refill: Capillary refill takes less than 2 seconds.  Neurological:     General: No focal deficit present.     Mental Status: She is alert and oriented to person, place, and time.  Psychiatric:        Mood and Affect: Mood normal.        Behavior: Behavior normal.        Thought Content: Thought content normal.        Judgment: Judgment normal.     BP 133/86   Pulse 60   Temp (!) 97.2 F (36.2 C) (Temporal)   Ht 5\' 6"  (1.676 m)   Wt 278 lb (126.1 kg)   SpO2 100%   BMI 44.87 kg/m  Wt Readings from Last 3 Encounters:  03/04/20 278 lb (126.1 kg)  12/03/19 288 lb 6.4 oz (130.8 kg)  08/06/19 (!) 313 lb (142 kg)     Health Maintenance Due  Topic Date Due  . PAP SMEAR-Modifier  12/09/2019    There are no preventive care reminders to display for this patient.  Lab Results  Component Value Date   TSH  2.050 08/06/2019   Lab Results  Component Value Date   WBC 8.2 08/06/2019   HGB 11.6 08/06/2019   HCT 34.5 08/06/2019   MCV 81 08/06/2019   PLT 376 08/06/2019   Lab Results  Component Value Date   NA 142 08/06/2019   K 4.1 08/06/2019   CO2 28 05/16/2019   GLUCOSE 88 08/06/2019   BUN 5 (L) 08/06/2019   CREATININE 0.85 08/06/2019   BILITOT 0.2 08/06/2019   ALKPHOS 101 08/06/2019   AST 19 08/06/2019   ALT 84 (H) 04/23/2019   PROT 7.4 08/06/2019   ALBUMIN 3.9 08/06/2019   CALCIUM 9.1 08/06/2019   ANIONGAP 12 05/16/2019   Lab Results  Component Value Date   CHOL 160 09/20/2015   Lab Results  Component Value Date   HDL 59 09/20/2015   Lab Results  Component Value Date   LDLCALC 81 09/20/2015   Lab Results  Component Value Date   TRIG 100 09/20/2015   Lab Results  Component Value Date   CHOLHDL 2.7 09/20/2015   Lab Results  Component Value Date   HGBA1C 5.4 04/09/2019      Assessment & Plan:   Problem List Items Addressed This Visit      Other   Morbid obesity ([place]) - Primary Obesity with BMI. Continue to encourage weight loss. She has lost 78 pounds since 04/2020. Discussed proper diet (low fat, low sodium, high fiber) with patient.   Discussed need for regular exercise (3 times per week, 20 minutes per session) with patient.    Relevant  Orders   CBC with Differential/Platelet    Other Visit Diagnoses    Depression, unspecified depression type     We will continue with current regimen.  Follow-up in 3 months if symptoms change or get worse patient is to call the office for sooner follow-up   Relevant Orders   Comp. Metabolic Panel (12)   Screening for STD (sexually transmitted disease)       Relevant Orders   Chlamydia/Gonococcus/Trichomonas, NAA   STD Screen (8)   Healthcare maintenance       Relevant Orders   Lipid panel   Hair loss     encouraged hair skin and nail vitamin OTC we will discuss further after labs   Relevant Orders   TSH    Vitamin B12   VITAMIN D 25 Hydroxy (Vit-D Deficiency, Fractures)   Magnesium      No orders of the defined types were placed in this encounter.   Follow-up: Return for well woman physcial per pt request.    Vevelyn Francois, NP

## 2020-03-04 NOTE — Addendum Note (Signed)
Addended by: Otilio Miu on: 03/04/2020 10:45 AM   Modules accepted: Orders

## 2020-03-04 NOTE — Patient Instructions (Signed)
Alopecia Areata, Adult  Alopecia areata is a condition that causes you to lose hair. You may lose hair on your scalp in patches. In some cases, you may lose all the hair on your scalp (alopecia totalis) or all the hair from your face and body (alopecia universalis). Alopecia areata is an autoimmune disease. This means that your body's defense system (immune system) mistakes normal parts of the body for germs or other things that can make you sick. When you have alopecia areata, the immune system attacks the hair follicles. Alopecia areata usually develops in childhood, but it can develop at any age. For some people, their hair grows back on its own and hair loss does not happen again. For others, their hair may fall out and grow back in cycles. The hair loss may last many years. Having this condition can be emotionally difficult, but it is not dangerous. What are the causes? The cause of this condition is not known. What increases the risk? This condition is more likely to develop in people who have:  A family history of alopecia.  A family history of another autoimmune disease, including type 1 diabetes and rheumatoid arthritis.  Asthma and allergies.  Down syndrome. What are the signs or symptoms? Round spots of patchy hair loss on the scalp is the main symptom of this condition. The spots may be mildly itchy. Other symptoms include:  Short dark hairs in the bald patches that are wider at the top (exclamation point hairs).  Dents, white spots, or lines in the fingernails or toenails.  Balding and body hair loss. This is rare. How is this diagnosed? This condition is diagnosed based on your symptoms and family history. Your health care provider will also check your scalp skin, teeth, and nails. Your health care provider may refer you to a specialist in hair and skin disorders (dermatologist). You may also have tests, including:  A hair pull test.  Blood tests or other screening tests  to check for autoimmune diseases, such as thyroid disease or diabetes.  Skin biopsy to confirm the diagnosis.  A procedure to examine the skin with a lighted magnifying instrument (dermoscopy). How is this treated? There is no cure for alopecia areata. Treatment is aimed at promoting the regrowth of hair and preventing the immune system from overreacting. No single treatment is right for all people with alopecia areata. It depends on the type of hair loss you have and how severe it is. Work with your health care provider to find the best treatment for you. Treatment may include:  Having regular checkups to make sure the condition is not getting worse (watchful waiting).  Steroid creams or pills for 6-8 weeks to stop the immune reaction and help hair to regrow more quickly.  Other topical medicines to alter the immune system response and support the hair growth cycle.  Steroid injections.  Therapy and counseling with a support group or therapist if you are having trouble coping with hair loss. Follow these instructions at home:  Learn as much as you can about your condition.  Apply topical creams only as told by your health care provider.  Take over-the-counter and prescription medicines only as told by your health care provider.  Consider getting a wig or products to make hair look fuller or to cover bald spots, if you feel uncomfortable with your appearance.  Get therapy or counseling if you are having a hard time coping with hair loss. Ask your health care provider to recommend  a counselor or support group.  Keep all follow-up visits as told by your health care provider. This is important. Contact a health care provider if:  Your hair loss gets worse, even with treatment.  You have new symptoms.  You are struggling emotionally. Summary  Alopecia areata is an autoimmune condition that makes your body's defense system (immune system) attack the hair follicles. This causes you  to lose hair.  Treatments may include regular checkups to make sure that the condition is not getting worse (watchful waiting), medicines, and steroid injections. This information is not intended to replace advice given to you by your health care provider. Make sure you discuss any questions you have with your health care provider. Document Revised: 05/18/2017 Document Reviewed: 06/23/2016 Elsevier Patient Education  2020 [place] Following a healthy eating pattern may help you to achieve and maintain a healthy body weight, reduce the risk of chronic disease, and live a long and productive life. It is important to follow a healthy eating pattern at an appropriate calorie level for your body. Your nutritional needs should be met primarily through food by choosing a variety of nutrient-rich foods. What are tips for following this plan? Reading food labels  Read labels and choose the following: ? Reduced or low sodium. ? Juices with 100% fruit juice. ? Foods with low saturated fats and high polyunsaturated and monounsaturated fats. ? Foods with whole grains, such as whole wheat, cracked wheat, brown rice, and wild rice. ? Whole grains that are fortified with folic acid. This is recommended for women who are pregnant or who want to become pregnant.  Read labels and avoid the following: ? Foods with a lot of added sugars. These include foods that contain brown sugar, corn sweetener, corn syrup, dextrose, fructose, glucose, high-fructose corn syrup, honey, invert sugar, lactose, malt syrup, maltose, molasses, raw sugar, sucrose, trehalose, or turbinado sugar.  Do not eat more than the following amounts of added sugar per day:  6 teaspoons (25 g) for women.  9 teaspoons (38 g) for men. ? Foods that contain processed or refined starches and grains. ? Refined grain products, such as white flour, degermed cornmeal, white bread, and white rice. Shopping  Choose nutrient-rich  snacks, such as vegetables, whole fruits, and nuts. Avoid high-calorie and high-sugar snacks, such as potato chips, fruit snacks, and candy.  Use oil-based dressings and spreads on foods instead of solid fats such as butter, stick margarine, or cream cheese.  Limit pre-made sauces, mixes, and "instant" products such as flavored rice, instant noodles, and ready-made pasta.  Try more plant-protein sources, such as tofu, tempeh, black beans, edamame, lentils, nuts, and seeds.  Explore eating plans such as the Mediterranean diet or vegetarian diet. Cooking  Use oil to saut or stir-fry foods instead of solid fats such as butter, stick margarine, or lard.  Try baking, boiling, grilling, or broiling instead of frying.  Remove the fatty part of meats before cooking.  Steam vegetables in water or broth. Meal planning   At meals, imagine dividing your plate into fourths: ? One-half of your plate is fruits and vegetables. ? One-fourth of your plate is whole grains. ? One-fourth of your plate is protein, especially lean meats, poultry, eggs, tofu, beans, or nuts.  Include low-fat dairy as part of your daily diet. Lifestyle  Choose healthy options in all settings, including home, work, school, restaurants, or stores.  Prepare your food safely: ? Wash your hands after handling raw meats. ?  Keep food preparation surfaces clean by regularly washing with hot, soapy water. ? Keep raw meats separate from ready-to-eat foods, such as fruits and vegetables. ? Cook seafood, meat, poultry, and eggs to the recommended internal temperature. ? Store foods at safe temperatures. In general:  Keep cold foods at 39F (4.4C) or below.  Keep hot foods at 139F (60C) or above.  Keep your freezer at [facility] (-17.8C) or below.  Foods are no longer safe to eat when they have been between the temperatures of 40-139F (4.4-60C) for more than 2 hours. What foods should I eat? Fruits Aim to eat 2  cup-equivalents of fresh, canned (in natural juice), or frozen fruits each day. Examples of 1 cup-equivalent of fruit include 1 small apple, 8 large strawberries, 1 cup canned fruit,  cup dried fruit, or 1 cup 100% juice. Vegetables Aim to eat 2-3 cup-equivalents of fresh and frozen vegetables each day, including different varieties and colors. Examples of 1 cup-equivalent of vegetables include 2 medium carrots, 2 cups raw, leafy greens, 1 cup chopped vegetable (raw or cooked), or 1 medium baked potato. Grains Aim to eat 6 ounce-equivalents of whole grains each day. Examples of 1 ounce-equivalent of grains include 1 slice of bread, 1 cup ready-to-eat cereal, 3 cups popcorn, or  cup cooked rice, pasta, or cereal. Meats and other proteins Aim to eat 5-6 ounce-equivalents of protein each day. Examples of 1 ounce-equivalent of protein include 1 egg, 1/2 cup nuts or seeds, or 1 tablespoon (16 g) peanut butter. A cut of meat or fish that is the size of a deck of cards is about 3-4 ounce-equivalents.  Of the protein you eat each week, try to have at least 8 ounces come from seafood. This includes salmon, trout, herring, and anchovies. Dairy Aim to eat 3 cup-equivalents of fat-free or low-fat dairy each day. Examples of 1 cup-equivalent of dairy include 1 cup (240 mL) milk, 8 ounces (250 g) yogurt, 1 ounces (44 g) natural cheese, or 1 cup (240 mL) fortified soy milk. Fats and oils  Aim for about 5 teaspoons (21 g) per day. Choose monounsaturated fats, such as canola and olive oils, avocados, peanut butter, and most nuts, or polyunsaturated fats, such as sunflower, corn, and soybean oils, walnuts, pine nuts, sesame seeds, sunflower seeds, and flaxseed. Beverages  Aim for six 8-oz glasses of water per day. Limit coffee to three to five 8-oz cups per day.  Limit caffeinated beverages that have added calories, such as soda and energy drinks.  Limit alcohol intake to no more than 1 drink a day for  nonpregnant women and 2 drinks a day for men. One drink equals 12 oz of beer (355 mL), 5 oz of wine (148 mL), or 1 oz of hard liquor (44 mL). Seasoning and other foods  Avoid adding excess amounts of salt to your foods. Try flavoring foods with herbs and spices instead of salt.  Avoid adding sugar to foods.  Try using oil-based dressings, sauces, and spreads instead of solid fats. This information is based on general [place] nutrition guidelines. For more information, visit [employer]. Exact amounts may vary based on your nutrition needs. Summary  A healthy eating plan may help you to maintain a healthy weight, reduce the risk of chronic diseases, and stay active throughout your life.  Plan your meals. Make sure you eat the right portions of a variety of nutrient-rich foods.  Try baking, boiling, grilling, or broiling instead of frying.  Choose healthy options in all  settings, including home, work, school, restaurants, or stores. This information is not intended to replace advice given to you by your health care provider. Make sure you discuss any questions you have with your health care provider. Document Revised: 09/17/2017 Document Reviewed: 09/17/2017 Elsevier Patient Education  [place].

## 2020-03-05 LAB — MAGNESIUM: Magnesium: 2.1 mg/dL (ref 1.6–2.3)

## 2020-03-05 LAB — CBC WITH DIFFERENTIAL/PLATELET
Basophils Absolute: 0 10*3/uL (ref 0.0–0.2)
Basos: 0 %
EOS (ABSOLUTE): 0.1 10*3/uL (ref 0.0–0.4)
Eos: 1 %
Hematocrit: 37.7 % (ref 34.0–46.6)
Hemoglobin: 12.9 g/dL (ref 11.1–15.9)
Immature Grans (Abs): 0 10*3/uL (ref 0.0–0.1)
Immature Granulocytes: 0 %
Lymphocytes Absolute: 2.2 10*3/uL (ref 0.7–3.1)
Lymphs: 28 %
MCH: 30.1 pg (ref 26.6–33.0)
MCHC: 34.2 g/dL (ref 31.5–35.7)
MCV: 88 fL (ref 79–97)
Monocytes Absolute: 0.4 10*3/uL (ref 0.1–0.9)
Monocytes: 6 %
Neutrophils Absolute: 4.9 10*3/uL (ref 1.4–7.0)
Neutrophils: 65 %
Platelets: 332 10*3/uL (ref 150–450)
RBC: 4.29 x10E6/uL (ref 3.77–5.28)
RDW: 14 % (ref 11.7–15.4)
WBC: 7.6 10*3/uL (ref 3.4–10.8)

## 2020-03-05 LAB — LIPID PANEL
Chol/HDL Ratio: 1.8 ratio (ref 0.0–4.4)
Cholesterol, Total: 130 mg/dL (ref 100–199)
HDL: 73 mg/dL (ref >39)
LDL Chol Calc (NIH): 43 mg/dL (ref 0–99)
Triglycerides: 69 mg/dL (ref 0–149)
VLDL Cholesterol Cal: 14 mg/dL (ref 5–40)

## 2020-03-05 LAB — COMP. METABOLIC PANEL (12)
AST: 27 IU/L (ref 0–40)
Albumin/Globulin Ratio: 1.5 (ref 1.2–2.2)
Albumin: 4 g/dL (ref 3.8–4.8)
Alkaline Phosphatase: 87 IU/L (ref 44–121)
BUN/Creatinine Ratio: 9 (ref 9–23)
BUN: 8 mg/dL (ref 6–24)
Bilirubin Total: 0.3 mg/dL (ref 0.0–1.2)
Calcium: 8.9 mg/dL (ref 8.7–10.2)
Chloride: 100 mmol/L (ref 96–106)
Creatinine, Ser: 0.87 mg/dL (ref 0.57–1.00)
GFR calc Af Amer: 93 mL/min/{1.73_m2} (ref >59)
GFR calc non Af Amer: 81 mL/min/{1.73_m2} (ref >59)
Globulin, Total: 2.6 g/dL (ref 1.5–4.5)
Glucose: 102 mg/dL — ABNORMAL HIGH (ref 65–99)
Potassium: 3.7 mmol/L (ref 3.5–5.2)
Sodium: 136 mmol/L (ref 134–144)
Total Protein: 6.6 g/dL (ref 6.0–8.5)

## 2020-03-05 LAB — VITAMIN B12: Vitamin B-12: 326 pg/mL (ref 232–1245)

## 2020-03-05 LAB — STD SCREEN (8)
HIV Screen 4th Generation wRfx: NONREACTIVE
HSV 1 Glycoprotein G Ab, IgG: 1.32 index — ABNORMAL HIGH (ref 0.00–0.90)
HSV 2 IgG, Type Spec: >23.6 index — ABNORMAL HIGH (ref 0.00–0.90)
Hep A IgM: NEGATIVE
Hep B C IgM: POSITIVE — AB
Hep C Virus Ab: <0.1 s/co ratio (ref 0.0–0.9)
Hepatitis B Surface Ag: NEGATIVE
RPR Ser Ql: NONREACTIVE

## 2020-03-05 LAB — TSH: TSH: 1.84 u[IU]/mL (ref 0.450–4.500)

## 2020-03-05 LAB — VITAMIN D 25 HYDROXY (VIT D DEFICIENCY, FRACTURES): Vit D, 25-Hydroxy: 33.9 ng/mL (ref 30.0–100.0)

## 2020-03-06 LAB — URINE CULTURE

## 2020-03-06 LAB — CHLAMYDIA/GONOCOCCUS/TRICHOMONAS, NAA
Chlamydia by NAA: NEGATIVE
Gonococcus by NAA: NEGATIVE
Trich vag by NAA: NEGATIVE

## 2020-03-18 ENCOUNTER — Ambulatory Visit: Payer: 59 | Admitting: Nurse Practitioner

## 2020-03-22 ENCOUNTER — Ambulatory Visit: Payer: 59 | Admitting: Nurse Practitioner

## 2020-03-24 ENCOUNTER — Ambulatory Visit: Payer: 59 | Admitting: Nurse Practitioner

## 2020-05-05 ENCOUNTER — Other Ambulatory Visit: Payer: Self-pay | Admitting: General Surgery

## 2020-05-05 DIAGNOSIS — C259 Malignant neoplasm of pancreas, unspecified: Secondary | ICD-10-CM

## 2020-05-10 ENCOUNTER — Other Ambulatory Visit: Payer: Self-pay

## 2020-05-10 ENCOUNTER — Ambulatory Visit (INDEPENDENT_AMBULATORY_CARE_PROVIDER_SITE_OTHER): Payer: 59 | Admitting: Nurse Practitioner

## 2020-05-10 DIAGNOSIS — F32A Depression, unspecified: Secondary | ICD-10-CM

## 2020-05-10 DIAGNOSIS — R609 Edema, unspecified: Secondary | ICD-10-CM

## 2020-05-10 DIAGNOSIS — C250 Malignant neoplasm of head of pancreas: Secondary | ICD-10-CM | POA: Diagnosis not present

## 2020-05-10 NOTE — Progress Notes (Signed)
[place], [place]  [address] Phone:  [phone]   Fax:  [phone]   Established Patient Office Visit  Subjective:  Patient ID: Kathryn Stevenson, female    DOB: [birth year]  Age: 44 y.o. MRN: [id number]  CC:  Chief Complaint  Patient presents with  . Follow-up    HPI Kathryn Stevenson presents for follow up. She  has a past medical history of Acid reflux, Dermatitis, Fracture, ankle, GERD (gastroesophageal reflux disease), Hepatic hemangioma, Hepatitis B, Malignant neoplasm of head of pancreas (Kathryn Stevenson), Morbid obesity (Kathryn Stevenson), and Pancreatic lesion (06/2018).   She is doing well overall. She was not aware that this apt was for her annual exam with Pap test. She would like to reschedule.  She continues to try to make good choices to maintain her current weight.  She has recently followed up with her surgeon; after one year. She has a abdominal CT scan scheduled for 05/18/20 for reevaluation.  She does continue to have increased bowel movements. She admits to being off the Creon for several weeks.   She is excited and nervous about getting married.   Denies headache, dizziness, visual changes, shortness of breath, dyspnea on exertion, chest pain, nausea, vomiting or any edema.    Past Medical History:  Diagnosis Date  . Acid reflux   . Dermatitis   . Fracture, ankle   . GERD (gastroesophageal reflux disease)   . Hepatic hemangioma   . Hepatitis B   . Malignant neoplasm of head of pancreas (Kathryn Stevenson)   . Morbid obesity (Kathryn Stevenson)   . Pancreatic lesion 06/2018   seen on MR of the liver     Past Surgical History:  Procedure Laterality Date  . ANKLE SURGERY Left   . BIOPSY  01/15/2019   Procedure: BIOPSY;  Surgeon: Rush Landmark Telford Nab., MD;  Location: [facility];  Service: Gastroenterology;;  . ESOPHAGOGASTRODUODENOSCOPY (EGD) WITH PROPOFOL N/A 01/15/2019   Procedure: ESOPHAGOGASTRODUODENOSCOPY (EGD) WITH PROPOFOL;  Surgeon: Irving Copas., MD;  Location: [facility];  Service: Gastroenterology;  Laterality: N/A;  . FINE NEEDLE ASPIRATION  01/15/2019   Procedure: FINE NEEDLE ASPIRATION (FNA) LINEAR;  Surgeon: Irving Copas., MD;  Location: [facility];  Service: Gastroenterology;;  . INDUCED ABORTION    . MOUTH SURGERY    . TUBAL LIGATION    . UPPER ESOPHAGEAL ENDOSCOPIC ULTRASOUND (EUS) N/A 01/15/2019   Procedure: UPPER ESOPHAGEAL ENDOSCOPIC ULTRASOUND (EUS);  Surgeon: Irving Copas., MD;  Location: [facility];  Service: Gastroenterology;  Laterality: N/A;  . WHIPPLE PROCEDURE N/A 04/15/2019   Procedure: WHIPPLE PROCEDURE;  Surgeon: Stark Klein, MD;  Location: [facility] OR;  Service: General;  Laterality: N/A;  EPIDURAL    Family History  Problem Relation Age of Onset  . Hypertension Mother   . Ovarian cancer Maternal Grandmother   . Heart attack Maternal Grandmother   . Esophageal cancer Neg Hx   . Colon cancer Neg Hx   . Pancreatic cancer Neg Hx   . Stomach cancer Neg Hx   . Liver disease Neg Hx     Social History   Socioeconomic History  . Marital status: Single    Spouse name: Not on file  . Number of children: Not on file  . Years of education: Not on file  . Highest education level: Not on file  Occupational History  . Not on file  Tobacco Use  . Smoking status: Former Smoker    Packs/day: 0.50  Types: Cigarettes    Quit date: 12/11/2016    Years since quitting: 3.4  . Smokeless tobacco: Never Used  Vaping Use  . Vaping Use: Never used  Substance and Sexual Activity  . Alcohol use: Yes    Comment: occasionally  . Drug use: No  . Sexual activity: Yes    Partners: Male    Birth control/protection: Surgical  Other Topics Concern  . Not on file  Social History Narrative  . Not on file   Social Determinants of Health   Financial Resource Strain:   . Difficulty of Paying Living Expenses: Not on file  Food Insecurity:   . Worried About Charity fundraiser in the  Last Year: Not on file  . Ran Out of Food in the Last Year: Not on file  Transportation Needs:   . Lack of Transportation (Medical): Not on file  . Lack of Transportation (Non-Medical): Not on file  Physical Activity:   . Days of Exercise per Week: Not on file  . Minutes of Exercise per Session: Not on file  Stress:   . Feeling of Stress : Not on file  Social Connections:   . Frequency of Communication with Friends and Family: Not on file  . Frequency of Social Gatherings with Friends and Family: Not on file  . Attends Religious Services: Not on file  . Active Member of Clubs or Organizations: Not on file  . Attends Archivist Meetings: Not on file  . Marital Status: Not on file  Intimate Partner Violence:   . Fear of Current or Ex-Partner: Not on file  . Emotionally Abused: Not on file  . Physically Abused: Not on file  . Sexually Abused: Not on file    Outpatient Medications Prior to Visit  Medication Sig Dispense Refill  . buPROPion (WELLBUTRIN XL) 150 MG 24 hr tablet Take 1 tablet (150 mg total) by mouth daily. 90 tablet 0  . CREON 36000-114000 units CPEP capsule Take by mouth.    . hydrochlorothiazide (HYDRODIURIL) 12.5 MG tablet Take 1 tablet (12.5 mg total) by mouth daily. 90 tablet 3  . lipase/protease/amylase (CREON) 12000 units CPEP capsule Take by mouth.    . multivitamin-iron-minerals-folic acid (CENTRUM) chewable tablet Chew 1 tablet by mouth daily.    . promethazine (PHENERGAN) 12.5 MG tablet Take 12.5 mg by mouth every 6 (six) hours as needed.     . metroNIDAZOLE (FLAGYL) 500 MG tablet Take 1 tablet (500 mg total) by mouth 3 (three) times daily. 21 tablet 0   No facility-administered medications prior to visit.    No Known Allergies  ROS Review of Systems    Objective:    Physical Exam Constitutional:      Appearance: She is obese.  HENT:     Head: Normocephalic and atraumatic.     Nose: Nose normal.     Mouth/Throat:     Mouth: Mucous  membranes are moist.  Cardiovascular:     Rate and Rhythm: Normal rate and regular rhythm.     Pulses: Normal pulses.  Pulmonary:     Effort: Pulmonary effort is normal.     Breath sounds: Normal breath sounds.  Abdominal:     General: Bowel sounds are normal.     Palpations: Abdomen is soft.  Musculoskeletal:        General: Normal range of motion.     Cervical back: Normal range of motion.  Skin:    General: Skin is warm.  Capillary Refill: Capillary refill takes less than 2 seconds.  Neurological:     General: No focal deficit present.     Mental Status: She is alert and oriented to person, place, and time.  Psychiatric:        Mood and Affect: Mood normal.        Behavior: Behavior normal.        Thought Content: Thought content normal.        Judgment: Judgment normal.     BP (!) 110/57 (BP Location: Left Arm, Patient Position: Sitting, Cuff Size: Large)   Pulse 60   Resp 20   Ht 5\' 6"  (1.676 m)   Wt 276 lb 12.8 oz (125.6 kg)   SpO2 100%   BMI 44.68 kg/m  Wt Readings from Last 3 Encounters:  05/10/20 276 lb 12.8 oz (125.6 kg)  03/04/20 278 lb (126.1 kg)  12/03/19 288 lb 6.4 oz (130.8 kg)     Health Maintenance Due  Topic Date Due  . PAP SMEAR-Modifier  12/09/2019    There are no preventive care reminders to display for this patient.  Lab Results  Component Value Date   TSH 1.840 03/04/2020   Lab Results  Component Value Date   WBC 7.6 03/04/2020   HGB 12.9 03/04/2020   HCT 37.7 03/04/2020   MCV 88 03/04/2020   PLT 332 03/04/2020   Lab Results  Component Value Date   NA 136 03/04/2020   K 3.7 03/04/2020   CO2 28 05/16/2019   GLUCOSE 102 (H) 03/04/2020   BUN 8 03/04/2020   CREATININE 0.87 03/04/2020   BILITOT 0.3 03/04/2020   ALKPHOS 87 03/04/2020   AST 27 03/04/2020   ALT 84 (H) 04/23/2019   PROT 6.6 03/04/2020   ALBUMIN 4.0 03/04/2020   CALCIUM 8.9 03/04/2020   ANIONGAP 12 05/16/2019   Lab Results  Component Value Date   CHOL 130  03/04/2020   Lab Results  Component Value Date   HDL 73 03/04/2020   Lab Results  Component Value Date   LDLCALC 43 03/04/2020   Lab Results  Component Value Date   TRIG 69 03/04/2020   Lab Results  Component Value Date   CHOLHDL 1.8 03/04/2020   Lab Results  Component Value Date   HGBA1C 5.4 04/09/2019      Assessment & Plan:   Problem List Items Addressed This Visit      Digestive   Pancreatic cancer ([place])     Other   Morbid obesity ([place]) - Primary Discussed continuation of good choices. Weight is down one pound   Encourage regular exercise      Other Visit Diagnoses    Edema, unspecified type     Stable continue use of HCTZ daily   Depression, unspecified depression type     Stable continue with Wellbutrin 150 mg daily.  Currently smoking admits that she is going to quit.       No orders of the defined types were placed in this encounter.   Follow-up: Return for Appointment As Scheduled.    Vevelyn Francois, NP

## 2020-05-11 ENCOUNTER — Encounter: Payer: Self-pay | Admitting: Nurse Practitioner

## 2020-05-18 ENCOUNTER — Other Ambulatory Visit: Payer: Self-pay | Admitting: General Surgery

## 2020-05-18 ENCOUNTER — Ambulatory Visit
Admission: RE | Admit: 2020-05-18 | Discharge: 2020-05-18 | Disposition: A | Discharge status: Home / Self Care | Payer: 59 | Source: Ambulatory Visit | Attending: General Surgery | Admitting: General Surgery

## 2020-05-18 DIAGNOSIS — C259 Malignant neoplasm of pancreas, unspecified: Secondary | ICD-10-CM

## 2020-05-21 ENCOUNTER — Ambulatory Visit: Payer: 59 | Admitting: Nurse Practitioner

## 2020-06-04 ENCOUNTER — Ambulatory Visit
Admission: RE | Admit: 2020-06-04 | Discharge: 2020-06-04 | Disposition: A | Discharge status: Home / Self Care | Payer: 59 | Source: Ambulatory Visit | Attending: General Surgery | Admitting: General Surgery

## 2020-06-04 DIAGNOSIS — C259 Malignant neoplasm of pancreas, unspecified: Secondary | ICD-10-CM

## 2020-06-04 MED ORDER — IOPAMIDOL (ISOVUE-300) INJECTION 61%
125.0000 mL | Freq: Once | INTRAVENOUS | Status: AC | PRN
  Administered 2020-06-04: 125 mL via INTRAVENOUS

## 2020-08-10 ENCOUNTER — Other Ambulatory Visit: Payer: Self-pay | Admitting: Nurse Practitioner

## 2020-08-10 DIAGNOSIS — Z1231 Encounter for screening mammogram for malignant neoplasm of breast: Secondary | ICD-10-CM

## 2020-09-13 ENCOUNTER — Other Ambulatory Visit: Payer: Self-pay

## 2020-09-13 ENCOUNTER — Ambulatory Visit (INDEPENDENT_AMBULATORY_CARE_PROVIDER_SITE_OTHER): Payer: 59 | Admitting: Nurse Practitioner

## 2020-09-13 ENCOUNTER — Encounter: Payer: Self-pay | Admitting: Nurse Practitioner

## 2020-09-13 VITALS — BP 151/96 | HR 52 | Ht 66.0 in | Wt 273.0 lb

## 2020-09-13 DIAGNOSIS — R030 Elevated blood-pressure reading, without diagnosis of hypertension: Secondary | ICD-10-CM | POA: Diagnosis not present

## 2020-09-13 DIAGNOSIS — L732 Hidradenitis suppurativa: Secondary | ICD-10-CM | POA: Diagnosis not present

## 2020-09-13 DIAGNOSIS — N898 Other specified noninflammatory disorders of vagina: Secondary | ICD-10-CM

## 2020-09-13 MED ORDER — HYDROCHLOROTHIAZIDE 25 MG PO TABS: 25.0000 mg | ORAL_TABLET | Freq: Every day | ORAL | 3 refills | Status: DC

## 2020-09-13 MED ORDER — DOXYCYCLINE HYCLATE 100 MG PO CAPS: 100.0000 mg | ORAL_CAPSULE | Freq: Two times a day (BID) | ORAL | 0 refills | Status: AC

## 2020-09-13 MED ORDER — CLOBETASOL PROPIONATE 0.05 % EX CREA: 1.0000 "application " | TOPICAL_CREAM | Freq: Two times a day (BID) | CUTANEOUS | 0 refills | Status: DC

## 2020-09-13 NOTE — Patient Instructions (Addendum)

## 2020-09-13 NOTE — Progress Notes (Signed)
[place], [place]  [address] Phone:  [phone]   Fax:  [phone]   Established Patient Office Visit  Subjective:  Patient ID: Kathryn Stevenson, female    DOB: 11-22-1975  Age: 45 y.o. MRN: [id number]  CC:  Chief Complaint  Patient presents with  . Hypertension    headaches  . Sinus Problem    Nasal swelling, has applied blistex  . Vaginal Itching  . Recurrent Skin Infections    HPI Kathryn Stevenson presents for follow up. She  has a past medical history of Acid reflux, Dermatitis, Fracture, ankle, GERD (gastroesophageal reflux disease), Hepatic hemangioma, Hepatitis B, Malignant neoplasm of head of pancreas ([place]), Morbid obesity ([place]), and Pancreatic lesion (06/2018).   Patient is here for follow-up of elevated blood pressure. She is not exercising, she does walk her dog and is not adherent to a low-salt diet. Blood pressure is not well controlled at home.  She has been off of her hydrochlorothiazide for at least 4 weeks.  Cardiac symptoms: Headaches. Patient denies chest pain, chest pressure/discomfort, claudication, dyspnea, exertional chest pressure/discomfort, fatigue, irregular heart beat, lower extremity edema, near-syncope, orthopnea, palpitations, paroxysmal nocturnal dyspnea, syncope and tachypnea. Cardiovascular risk factors: obesity (BMI >= 30 kg/m2) and sedentary lifestyle. Use of agents associated with hypertension: none. History of target organ damage: none.  She admits that she is also having some vaginal itch.  She feels like this may be related to soap use. She denies  any pelvic pain or tenderness, amenorrhea irregular bleeding or prolonged heavy bleeding.  Denies vaginal discharge or dysuria.  Denies ulcers or lesions.  She describes it as a bump, that is located on her left axilla.  It was first noticed 2 weeks ago. It has been causing pain with light touch and is increasing diameter, increasing thickness, none.  Previously this spot has been excised. Other spots that are concerning: Infection.  She has history of hidradenitis.  She admits that she is follow-up with the surgeon and is weaning off of the Creon.  She does continue to use pantoprazole.  She also is requesting to remain off the Wellbutrin.   Past Medical History:  Diagnosis Date  . Acid reflux   . Dermatitis   . Fracture, ankle   . GERD (gastroesophageal reflux disease)   . Hepatic hemangioma   . Hepatitis B   . Malignant neoplasm of head of pancreas ([place])   . Morbid obesity ([place])   . Pancreatic lesion 06/2018   seen on MR of the liver     Past Surgical History:  Procedure Laterality Date  . ANKLE SURGERY Left   . BIOPSY  01/15/2019   Procedure: BIOPSY;  Surgeon: Rush Landmark Telford Nab., MD;  Location: [facility];  Service: Gastroenterology;;  . ESOPHAGOGASTRODUODENOSCOPY (EGD) WITH PROPOFOL N/A 01/15/2019   Procedure: ESOPHAGOGASTRODUODENOSCOPY (EGD) WITH PROPOFOL;  Surgeon: Irving Copas., MD;  Location: [facility];  Service: Gastroenterology;  Laterality: N/A;  . FINE NEEDLE ASPIRATION  01/15/2019   Procedure: FINE NEEDLE ASPIRATION (FNA) LINEAR;  Surgeon: Irving Copas., MD;  Location: [facility];  Service: Gastroenterology;;  . INDUCED ABORTION    . MOUTH SURGERY    . TUBAL LIGATION    . UPPER ESOPHAGEAL ENDOSCOPIC ULTRASOUND (EUS) N/A 01/15/2019   Procedure: UPPER ESOPHAGEAL ENDOSCOPIC ULTRASOUND (EUS);  Surgeon: Irving Copas., MD;  Location: [facility];  Service: Gastroenterology;  Laterality: N/A;  . WHIPPLE PROCEDURE N/A 04/15/2019   Procedure:  WHIPPLE PROCEDURE;  Surgeon: Stark Klein, MD;  Location: [facility] OR;  Service: General;  Laterality: N/A;  EPIDURAL    Family History  Problem Relation Age of Onset  . Hypertension Mother   . Ovarian cancer Maternal Grandmother   . Heart attack Maternal Grandmother   . Esophageal cancer Neg Hx   . Colon cancer Neg Hx   . Pancreatic cancer  Neg Hx   . Stomach cancer Neg Hx   . Liver disease Neg Hx     Social History   Socioeconomic History  . Marital status: Single    Spouse name: Not on file  . Number of children: Not on file  . Years of education: Not on file  . Highest education level: Not on file  Occupational History  . Not on file  Tobacco Use  . Smoking status: Former Smoker    Packs/day: 0.50    Types: Cigarettes    Quit date: 12/11/2016    Years since quitting: 3.7  . Smokeless tobacco: Never Used  Vaping Use  . Vaping Use: Never used  Substance and Sexual Activity  . Alcohol use: Yes    Comment: occasionally  . Drug use: No  . Sexual activity: Yes    Partners: Male    Birth control/protection: Surgical  Other Topics Concern  . Not on file  Social History Narrative  . Not on file   Social Determinants of Health   Financial Resource Strain: Not on file  Food Insecurity: Not on file  Transportation Needs: Not on file  Physical Activity: Not on file  Stress: Not on file  Social Connections: Not on file  Intimate Partner Violence: Not on file    Outpatient Medications Prior to Visit  Medication Sig Dispense Refill  . multivitamin-iron-minerals-folic acid (CENTRUM) chewable tablet Chew 1 tablet by mouth daily.    . pantoprazole (PROTONIX) 20 MG tablet Take 20 mg by mouth daily.    . promethazine (PHENERGAN) 12.5 MG tablet Take 12.5 mg by mouth every 6 (six) hours as needed.     Marland Kitchen buPROPion (WELLBUTRIN XL) 150 MG 24 hr tablet Take 1 tablet (150 mg total) by mouth daily. 90 tablet 0  . CREON 36000-114000 units CPEP capsule Take by mouth.    . hydrochlorothiazide (HYDRODIURIL) 12.5 MG tablet Take 1 tablet (12.5 mg total) by mouth daily. 90 tablet 3  . lipase/protease/amylase (CREON) 12000 units CPEP capsule Take by mouth.     No facility-administered medications prior to visit.    No Known Allergies  ROS Review of Systems  Constitutional: Negative.   Eyes: Negative.   Respiratory:  Negative.   Cardiovascular: Negative.  Negative for chest pain and leg swelling.  Neurological: Positive for headaches. Negative for syncope.      Objective:    Physical Exam Constitutional:      General: She is not in acute distress.    Appearance: She is obese.  HENT:     Head: Normocephalic and atraumatic.  Cardiovascular:     Rate and Rhythm: Regular rhythm. Bradycardia present.     Pulses: Normal pulses.     Heart sounds: Normal heart sounds.  Pulmonary:     Effort: Pulmonary effort is normal.     Breath sounds: Normal breath sounds.  Abdominal:     Palpations: Abdomen is soft.  Musculoskeletal:        General: Normal range of motion.     Cervical back: Normal range of motion.  Skin:  General: Skin is warm.     Capillary Refill: Capillary refill takes less than 2 seconds.     Comments: Nodule left axillary region  Neurological:     General: No focal deficit present.     Mental Status: She is alert and oriented to person, place, and time.  Psychiatric:        Mood and Affect: Mood normal.        Behavior: Behavior normal.        Thought Content: Thought content normal.        Judgment: Judgment normal.     Comments: concern about her health     BP (!) 151/96   Pulse (!) 52   Ht 5\' 6"  (1.676 m)   Wt 273 lb (123.8 kg)   SpO2 100%   BMI 44.06 kg/m  Wt Readings from Last 3 Encounters:  09/13/20 273 lb (123.8 kg)  05/10/20 276 lb 12.8 oz (125.6 kg)  03/04/20 278 lb (126.1 kg)     Health Maintenance Due  Topic Date Due  . COLONOSCOPY (Pts 45-30yrs Insurance coverage will need to be confirmed)  Never done  . COVID-19 Vaccine (3 - Moderna risk 4-dose series) 11/11/2019  . PAP SMEAR-Modifier  12/09/2019    There are no preventive care reminders to display for this patient.  Lab Results  Component Value Date   TSH 1.840 03/04/2020   Lab Results  Component Value Date   WBC 7.6 03/04/2020   HGB 12.9 03/04/2020   HCT 37.7 03/04/2020   MCV 88  03/04/2020   PLT 332 03/04/2020   Lab Results  Component Value Date   NA 136 03/04/2020   K 3.7 03/04/2020   CO2 28 05/16/2019   GLUCOSE 102 (H) 03/04/2020   BUN 8 03/04/2020   CREATININE 0.87 03/04/2020   BILITOT 0.3 03/04/2020   ALKPHOS 87 03/04/2020   AST 27 03/04/2020   ALT 84 (H) 04/23/2019   PROT 6.6 03/04/2020   ALBUMIN 4.0 03/04/2020   CALCIUM 8.9 03/04/2020   ANIONGAP 12 05/16/2019   Lab Results  Component Value Date   CHOL 130 03/04/2020   Lab Results  Component Value Date   HDL 73 03/04/2020   Lab Results  Component Value Date   LDLCALC 43 03/04/2020   Lab Results  Component Value Date   TRIG 69 03/04/2020   Lab Results  Component Value Date   CHOLHDL 1.8 03/04/2020   Lab Results  Component Value Date   HGBA1C 5.4 04/09/2019      Assessment & Plan:   Problem List Items Addressed This Visit      Other   Morbid obesity ([place]) Continue weight loss approximately 3 pounds    Other Visit Diagnoses    Vaginal itching    -  Primary Persistent new swab pending Trial clobetasol cream for vaginal itch until swab results   Relevant Orders   NuSwab Vaginitis Plus (VG+)   Elevated blood pressure reading     New however was using hydrochlorothiazide.  Will refill 25 mg daily and have patient to monitor blood pressure at home goal is less than 130/80   Hidradenitis     Recurrent trial of doxycycline 100 mg twice dailY      Meds ordered this encounter  Medications  . hydrochlorothiazide (HYDRODIURIL) 25 MG tablet    Sig: Take 1 tablet (25 mg total) by mouth daily.    Dispense:  90 tablet    Refill:  3  Order Specific Question:   Supervising Provider    Answer:   Tresa Garter [id number]  . doxycycline (VIBRAMYCIN) 100 MG capsule    Sig: Take 1 capsule (100 mg total) by mouth 2 (two) times daily for 7 days.    Dispense:  14 capsule    Refill:  0    Order Specific Question:   Supervising Provider    Answer:   Tresa Garter  [id number]  . clobetasol cream (TEMOVATE) 0.05 %    Sig: Apply 1 application topically 2 (two) times daily.    Dispense:  30 g    Refill:  0    Order Specific Question:   Supervising Provider    Answer:   Tresa Garter [[id number]]    Follow-up: Return in about 6 weeks (around 10/25/2020) for Follow up HTN [id number] can be vrtual.    Vevelyn Francois, NP

## 2020-09-16 LAB — NUSWAB VAGINITIS PLUS (VG+)
Candida albicans, NAA: NEGATIVE
Candida glabrata, NAA: NEGATIVE
Chlamydia trachomatis, NAA: NEGATIVE
Neisseria gonorrhoeae, NAA: NEGATIVE
Trich vag by NAA: NEGATIVE

## 2020-09-22 ENCOUNTER — Ambulatory Visit (HOSPITAL_COMMUNITY)
Admission: EM | Admit: 2020-09-22 | Discharge: 2020-09-22 | Disposition: A | Discharge status: Home / Self Care | Payer: 59 | Attending: Family Medicine | Admitting: Family Medicine

## 2020-09-22 ENCOUNTER — Telehealth: Payer: Self-pay

## 2020-09-22 ENCOUNTER — Other Ambulatory Visit: Payer: Self-pay

## 2020-09-22 ENCOUNTER — Encounter (HOSPITAL_COMMUNITY): Payer: Self-pay

## 2020-09-22 DIAGNOSIS — L02419 Cutaneous abscess of limb, unspecified: Secondary | ICD-10-CM

## 2020-09-22 MED ORDER — LIDOCAINE-EPINEPHRINE 1 %-1:100000 IJ SOLN
INTRAMUSCULAR | Status: AC
  Filled 2020-09-22: qty 1

## 2020-09-22 MED ORDER — HIBICLENS 4 % EX LIQD: Freq: Every day | CUTANEOUS | 0 refills | Status: DC | PRN

## 2020-09-22 MED ORDER — SULFAMETHOXAZOLE-TRIMETHOPRIM 800-160 MG PO TABS: 1.0000 | ORAL_TABLET | Freq: Two times a day (BID) | ORAL | 0 refills | Status: AC

## 2020-09-22 NOTE — Telephone Encounter (Signed)
Boil is getting larger. Needs some advice to handle this.

## 2020-09-22 NOTE — Telephone Encounter (Signed)
Patient went to urgent care for boil.

## 2020-09-22 NOTE — ED Triage Notes (Signed)
Pt reports having a cysts in the left axilla x 1 week. Pt reports she felt she had a fever this morning. Denies drainage. Pt reports she finished taking doxycycline for the abscess 2 days ago.

## 2020-09-22 NOTE — Discharge Instructions (Addendum)
Use the Hibiclens cleaner twice daily to the area until fully resolved

## 2020-09-22 NOTE — ED Provider Notes (Signed)
[facility]    CSN: [id number] Arrival date & time: 09/22/20  0907      History   Chief Complaint Chief Complaint  Patient presents with  . Abscess  . Fever    HPI Kathryn Stevenson is a 45 y.o. female.   Patient presenting today with 1 week history of worsening left axillary abscess.  She states it is red, painful, growing in size.  Has not been actively draining.  No fevers, chills, sweats, body aches.  Just finished a few doxycycline that she had at home.  Has not been doing anything other than warm compresses over-the-counter.  History of recurrent boils in this area.     Past Medical History:  Diagnosis Date  . Acid reflux   . Dermatitis   . Fracture, ankle   . GERD (gastroesophageal reflux disease)   . Hepatic hemangioma   . Hepatitis B   . 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 Morbid obesity ([place])   . Pancreatic lesion 06/2018   seen on MR of the liver     Patient Active Problem List   Diagnosis Date Noted  . Pancreatic cancer ([place]) 04/15/2019  . Primary malignant neoplasm of head of pancreas ([place]) 04/15/2019  . Acute hepatitis B   . Elevated LFTs   . Malaise and fatigue   . Liver dysfunction 07/13/2018  . Pancreatic lesion 07/13/2018  . Anxiety 05/15/2017  . Former smoker 05/15/2017  . Right leg pain 03/29/2017  . RUQ abdominal pain 06/30/2016  . Liver lesion 06/30/2016  . Gastroesophageal reflux disease 06/30/2016  . Morbid obesity ([place]) 09/20/2015  . Severe obesity (BMI >= 40) ([place]) 09/20/2015  . Dermatitis 09/20/2015    Past Surgical History:  Procedure Laterality Date  . ANKLE SURGERY Left   . BIOPSY  01/15/2019   Procedure: BIOPSY;  Surgeon: Rush Landmark Telford Nab., MD;  Location: [facility];  Service: Gastroenterology;;  . ESOPHAGOGASTRODUODENOSCOPY (EGD) WITH PROPOFOL N/A 01/15/2019   Procedure: ESOPHAGOGASTRODUODENOSCOPY (EGD) WITH PROPOFOL;  Surgeon: Irving Copas., MD;  Location: [facility];  Service:  Gastroenterology;  Laterality: N/A;  . FINE NEEDLE ASPIRATION  01/15/2019   Procedure: FINE NEEDLE ASPIRATION (FNA) LINEAR;  Surgeon: Irving Copas., MD;  Location: [facility];  Service: Gastroenterology;;  . INDUCED ABORTION    . MOUTH SURGERY    . TUBAL LIGATION    . UPPER ESOPHAGEAL ENDOSCOPIC ULTRASOUND (EUS) N/A 01/15/2019   Procedure: UPPER ESOPHAGEAL ENDOSCOPIC ULTRASOUND (EUS);  Surgeon: Irving Copas., MD;  Location: [facility];  Service: Gastroenterology;  Laterality: N/A;  . WHIPPLE PROCEDURE N/A 04/15/2019   Procedure: WHIPPLE PROCEDURE;  Surgeon: Stark Klein, MD;  Location: [facility];  Service: General;  Laterality: N/A;  EPIDURAL    OB History    Gravida  6   Para  2   Term  2   Preterm      AB  4   Living  2     SAB  1   IAB  3   Ectopic      Multiple      Live Births               Home Medications    Prior to Admission medications   Medication Sig Start Date End Date Taking? Authorizing Provider  chlorhexidine (HIBICLENS) 4 % external liquid Apply topically daily as needed. 09/22/20  Yes Volney American, PA-C  sulfamethoxazole-trimethoprim (BACTRIM DS) 800-160 MG tablet Take 1 tablet by  mouth 2 (two) times daily for 10 days. 09/22/20 10/02/20 Yes Volney American, PA-C  clobetasol cream (TEMOVATE) 5.62 % Apply 1 application topically 2 (two) times daily. 09/13/20   Vevelyn Francois, NP  hydrochlorothiazide (HYDRODIURIL) 25 MG tablet Take 1 tablet (25 mg total) by mouth daily. 09/13/20   Vevelyn Francois, NP  multivitamin-iron-minerals-folic acid (CENTRUM) chewable tablet Chew 1 tablet by mouth daily.    [provider]  pantoprazole (PROTONIX) 20 MG tablet Take 20 mg by mouth daily.    [provider]  promethazine (PHENERGAN) 12.5 MG tablet Take 12.5 mg by mouth every 6 (six) hours as needed.  08/23/19   [provider]    Family History Family History  Problem Relation Age of Onset  .  Hypertension Mother   . Ovarian cancer Maternal Grandmother   . Heart attack Maternal Grandmother   . Esophageal cancer Neg Hx   . Colon cancer Neg Hx   . Pancreatic cancer Neg Hx   . Stomach cancer Neg Hx   . Liver disease Neg Hx     Social History Social History   Tobacco Use  . Smoking status: Former Smoker    Packs/day: 0.50    Types: Cigarettes    Quit date: 12/11/2016    Years since quitting: 3.7  . Smokeless tobacco: Never Used  Vaping Use  . Vaping Use: Never used  Substance Use Topics  . Alcohol use: Yes    Comment: occasionally  . Drug use: No     Allergies   Patient has no known allergies.   Review of Systems Review of Systems Per HPI Physical Exam Triage Vital Signs ED Triage Vitals  Enc Vitals Group     BP 09/22/20 0900 100/75     Pulse Rate 09/22/20 0900 70     Resp 09/22/20 0900 18     Temp 09/22/20 0900 98.2 F (36.8 C)     Temp Source 09/22/20 0900 Oral     SpO2 --      Weight --      Height --      Head Circumference --      Peak Flow --      Pain Score 09/22/20 0949 10     Pain Loc --      Pain Edu? --      Excl. in [place]? --    No data found.  Updated Vital Signs BP 100/75 (BP Location: Right Arm)   Pulse 70   Temp 98.2 F (36.8 C) (Oral)   Resp 18   Visual Acuity Right Eye Distance:   Left Eye Distance:   Bilateral Distance:    Right Eye Near:   Left Eye Near:    Bilateral Near:     Physical Exam Vitals and nursing note reviewed.  Constitutional:      Appearance: Normal appearance. She is not ill-appearing.  HENT:     Head: Atraumatic.  Eyes:     Extraocular Movements: Extraocular movements intact.     Conjunctiva/sclera: Conjunctivae normal.  Cardiovascular:     Rate and Rhythm: Normal rate and regular rhythm.     Heart sounds: Normal heart sounds.  Pulmonary:     Effort: Pulmonary effort is normal.     Breath sounds: Normal breath sounds.  Musculoskeletal:        General: Normal range of motion.     Cervical  back: Normal range of motion and neck supple.  Skin:  General: Skin is warm and dry.     Findings: Erythema present.     Comments: Erythematous, edematous, fluctuant abscess left axilla Numerous areas of scarring scattered throughout left axilla  Neurological:     Mental Status: She is alert and oriented to person, place, and time.  Psychiatric:        Mood and Affect: Mood normal.        Thought Content: Thought content normal.        Judgment: Judgment normal.      UC Treatments / Results  Labs (all labs ordered are listed, but only abnormal results are displayed) Labs Reviewed - No data to display  EKG   Radiology No results found.  Procedures Incision and Drainage  Date/Time: 09/22/2020 11:45 AM Performed by: Volney American, PA-C Authorized by: Volney American, PA-C   Consent:    Consent obtained:  Verbal   Consent given by:  Patient   Risks, benefits, and alternatives were discussed: yes     Risks discussed:  Bleeding, incomplete drainage, pain and infection   Alternatives discussed:  Alternative treatment Universal protocol:    Procedure explained and questions answered to patient or proxy's satisfaction: yes     Relevant documents present and verified: yes     Site/side marked: yes     Immediately prior to procedure, a time out was called: yes     Patient identity confirmed:  Verbally with patient and arm band Location:    Type:  Abscess   Size:  2 cm   Location: Left axilla. Pre-procedure details:    Skin preparation:  Chlorhexidine with alcohol Sedation:    Sedation type:  None Anesthesia:    Anesthesia method:  Local infiltration   Local anesthetic:  Lidocaine 1% WITH epi Procedure type:    Complexity:  Simple Procedure details:    Ultrasound guidance: no     Needle aspiration: no     Incision types:  Stab incision   Incision depth:  Dermal   Wound management:  Probed and deloculated   Drainage:  Purulent   Drainage amount:   Copious   Wound treatment:  Wound left open   Packing materials:  None Post-procedure details:    Procedure completion:  Tolerated well, no immediate complications   (including critical care time)  Medications Ordered in UC Medications - No data to display  Initial Impression / Assessment and Plan / UC Course  I have reviewed the triage vital signs and the nursing notes.  Pertinent labs & imaging results that were available during my care of the patient were reviewed by me and considered in my medical decision making (see chart for details).     I&D performed today with excellent benefit.  Copious amounts of purulent discharge expressed from abscess.  Will treat with 10-day course of Bactrim, Hibiclens, Neosporin.  Home wound care reviewed.  Return precautions reviewed.  Final Clinical Impressions(s) / UC Diagnoses   Final diagnoses:  Axillary abscess     Discharge Instructions     Use the Hibiclens cleaner twice daily to the area until fully resolved    ED Prescriptions    Medication Sig Dispense Auth. Provider   sulfamethoxazole-trimethoprim (BACTRIM DS) 800-160 MG tablet Take 1 tablet by mouth 2 (two) times daily for 10 days. 20 tablet Volney American, [place]   chlorhexidine (HIBICLENS) 4 % external liquid Apply topically daily as needed. 120 mL Volney American, [place]     PDMP not reviewed  this encounter.   Volney American, [place] 09/22/20 1147

## 2020-09-30 ENCOUNTER — Other Ambulatory Visit: Payer: Self-pay

## 2020-09-30 ENCOUNTER — Ambulatory Visit
Admission: RE | Admit: 2020-09-30 | Discharge: 2020-09-30 | Disposition: A | Discharge status: Home / Self Care | Payer: 59 | Source: Ambulatory Visit | Attending: Nurse Practitioner | Admitting: Nurse Practitioner

## 2020-09-30 DIAGNOSIS — Z1231 Encounter for screening mammogram for malignant neoplasm of breast: Secondary | ICD-10-CM

## 2020-11-08 ENCOUNTER — Ambulatory Visit: Payer: 59 | Admitting: Nurse Practitioner

## 2020-11-27 ENCOUNTER — Ambulatory Visit
Admission: EM | Admit: 2020-11-27 | Discharge: 2020-11-27 | Disposition: A | Discharge status: Home / Self Care | Payer: 59 | Attending: Emergency Medicine | Admitting: Emergency Medicine

## 2020-11-27 ENCOUNTER — Encounter: Payer: Self-pay | Admitting: Emergency Medicine

## 2020-11-27 ENCOUNTER — Other Ambulatory Visit: Payer: Self-pay

## 2020-11-27 DIAGNOSIS — M5441 Lumbago with sciatica, right side: Secondary | ICD-10-CM

## 2020-11-27 DIAGNOSIS — L02412 Cutaneous abscess of left axilla: Secondary | ICD-10-CM

## 2020-11-27 MED ORDER — TIZANIDINE HCL 2 MG PO TABS
2.0000 mg | ORAL_TABLET | Freq: Four times a day (QID) | ORAL | 0 refills | Status: DC | PRN
Start: 2020-11-27 — End: 2021-01-14

## 2020-11-27 MED ORDER — HIBICLENS 4 % EX LIQD: Freq: Every day | CUTANEOUS | 0 refills | Status: DC | PRN

## 2020-11-27 MED ORDER — IBUPROFEN 800 MG PO TABS: 800.0000 mg | ORAL_TABLET | Freq: Three times a day (TID) | ORAL | 0 refills | Status: DC

## 2020-11-27 MED ORDER — KETOROLAC TROMETHAMINE 30 MG/ML IJ SOLN
30.0000 mg | Freq: Once | INTRAMUSCULAR | Status: AC
  Administered 2020-11-27: 09:00:00 30 mg via INTRAMUSCULAR

## 2020-11-27 MED ORDER — DOXYCYCLINE HYCLATE 100 MG PO CAPS: 100.0000 mg | ORAL_CAPSULE | Freq: Two times a day (BID) | ORAL | 0 refills | Status: AC

## 2020-11-27 NOTE — ED Triage Notes (Signed)
Pt sts right sided lower back pain x months with radiation down right leg that has been worse over last few days

## 2020-11-27 NOTE — ED Provider Notes (Signed)
[facility]    CSN: [id number] Arrival date & time: 11/27/20  0800      History   Chief Complaint Chief Complaint  Patient presents with   Back Pain    HPI Kathryn Stevenson is a 45 y.o. female history of pancreatic cancer, presenting today for evaluation of back pain.  Reports that she has had back pain over the past few months.  Pain radiating into right leg over the last few days.  Denies injury or trauma.  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  Denies urinary symptoms.  Also reports flaring of abscess to left axilla.  Requesting refill of Hibiclens.  Denies drainage.  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[place])    Morbid obesity ([place])    Pancreatic lesion 06/2018   seen on MR of the liver     Patient Active Problem List   Diagnosis Date Noted   Pancreatic cancer ([place]) 04/15/2019   Primary malignant neoplasm of head of pancreas ([place]) 04/15/2019   Acute hepatitis B    Elevated LFTs    Malaise and fatigue    Liver dysfunction 07/13/2018   Pancreatic lesion 07/13/2018   Anxiety 05/15/2017   Former smoker 05/15/2017   Right leg pain 03/29/2017   RUQ abdominal pain 06/30/2016   Liver lesion 06/30/2016   Gastroesophageal reflux disease 06/30/2016   Morbid obesity ([place]) 09/20/2015   Severe obesity (BMI >= 40) ([place]) 09/20/2015   Dermatitis 09/20/2015    Past Surgical History:  Procedure Laterality Date   ANKLE SURGERY Left    BIOPSY  01/15/2019   Procedure: BIOPSY;  Surgeon: Irving Copas., MD;  Location: [facility];  Service: Gastroenterology;;   ESOPHAGOGASTRODUODENOSCOPY (EGD) WITH PROPOFOL N/A 01/15/2019   Procedure: ESOPHAGOGASTRODUODENOSCOPY (EGD) WITH PROPOFOL;  Surgeon: Irving Copas., MD;  Location: [facility];  Service:  Gastroenterology;  Laterality: N/A;   FINE NEEDLE ASPIRATION  01/15/2019   Procedure: FINE NEEDLE ASPIRATION (FNA) LINEAR;  Surgeon: Irving Copas., MD;  Location: [facility];  Service: Gastroenterology;;   INDUCED ABORTION     MOUTH SURGERY     TUBAL LIGATION     UPPER ESOPHAGEAL ENDOSCOPIC ULTRASOUND (EUS) N/A 01/15/2019   Procedure: UPPER ESOPHAGEAL ENDOSCOPIC ULTRASOUND (EUS);  Surgeon: Irving Copas., MD;  Location: [facility];  Service: Gastroenterology;  Laterality: N/A;   WHIPPLE PROCEDURE N/A 04/15/2019   Procedure: WHIPPLE PROCEDURE;  Surgeon: Stark Klein, MD;  Location: [facility];  Service: General;  Laterality: N/A;  EPIDURAL    OB History     Gravida  6   Para  2   Term  2   Preterm      AB  4   Living  2      SAB  1   IAB  3   Ectopic      Multiple      Live Births               Home Medications    Prior to Admission medications   Medication Sig Start Date End Date Taking? Authorizing Provider  doxycycline (VIBRAMYCIN) 100 MG capsule Take 1 capsule (100 mg total) by mouth 2 (two) times daily for 7 days. 11/27/20 12/04/20  Yes Srihan Brutus C, PA-C  ibuprofen (ADVIL) 800 MG tablet Take 1 tablet (800 mg total) by mouth 3 (three) times daily. 11/27/20  Yes Jaloni Sorber C, PA-C  tiZANidine (ZANAFLEX) 2 MG tablet Take 1-2 tablets (2-4 mg total) by mouth every 6 (six) hours as needed for muscle spasms. 11/27/20  Yes Mayola Mcbain C, PA-C  chlorhexidine (HIBICLENS) 4 % external liquid Apply topically daily as needed. 11/27/20   Ulys Favia C, PA-C  clobetasol cream (TEMOVATE) 0.16 % Apply 1 application topically 2 (two) times daily. 09/13/20   Vevelyn Francois, NP  hydrochlorothiazide (HYDRODIURIL) 25 MG tablet Take 1 tablet (25 mg total) by mouth daily. 09/13/20   Vevelyn Francois, NP  multivitamin-iron-minerals-folic acid (CENTRUM) chewable tablet Chew 1 tablet by mouth daily.    [provider]  pantoprazole (PROTONIX) 20 MG  tablet Take 20 mg by mouth daily.    [provider]  promethazine (PHENERGAN) 12.5 MG tablet Take 12.5 mg by mouth every 6 (six) hours as needed.  08/23/19   [provider]    Family History Family History  Problem Relation Age of Onset   Hypertension Mother    Ovarian cancer Maternal Grandmother    Heart attack Maternal Grandmother    Esophageal cancer Neg Hx    Colon cancer Neg Hx    Pancreatic cancer Neg Hx    Stomach cancer Neg Hx    Liver disease Neg Hx     Social History Social History   Tobacco Use   Smoking status: Former    Packs/day: 0.50    Pack years: 0.00    Types: Cigarettes    Quit date: 12/11/2016    Years since quitting: 3.9   Smokeless tobacco: Never  Vaping Use   Vaping Use: Never used  Substance Use Topics   Alcohol use: Yes    Comment: occasionally   Drug use: No     Allergies   Patient has no known allergies.   Review of Systems Review of Systems  Constitutional:  Negative for fatigue and fever.  HENT:  Negative for mouth sores.   Eyes:  Negative for visual disturbance.  Respiratory:  Negative for shortness of breath.   Cardiovascular:  Negative for chest pain.  Gastrointestinal:  Negative for abdominal pain, nausea and vomiting.  Genitourinary:  Negative for genital sores.  Musculoskeletal:  Positive for back pain. Negative for arthralgias and joint swelling.  Skin:  Negative for color change, rash and wound.  Neurological:  Negative for dizziness, weakness, light-headedness and headaches.    Physical Exam Triage Vital Signs ED Triage Vitals [11/27/20 0820]  Enc Vitals Group     BP 117/84     Pulse Rate 62     Resp 18     Temp 98.4 F (36.9 C)     Temp Source Oral     SpO2 98 %     Weight      Height      Head Circumference      Peak Flow      Pain Score      Pain Loc      Pain Edu?      Excl. in [place]?    No data found.  Updated Vital Signs BP 117/84 (BP Location: Left Arm)   Pulse 62   Temp 98.4 F  (36.9 C) (Oral)   Resp 18   SpO2 98%   Visual Acuity Right Eye Distance:   Left Eye Distance:  Bilateral Distance:    Right Eye Near:   Left Eye Near:    Bilateral Near:     Physical Exam Vitals and nursing note reviewed.  Constitutional:      Appearance: She is well-developed.     Comments: No acute distress  HENT:     Head: Normocephalic and atraumatic.     Nose: Nose normal.  Eyes:     Conjunctiva/sclera: Conjunctivae normal.  Cardiovascular:     Rate and Rhythm: Normal rate.  Pulmonary:     Effort: Pulmonary effort is normal. No respiratory distress.  Abdominal:     General: There is no distension.  Musculoskeletal:        General: Normal range of motion.     Cervical back: Neck supple.     Comments: Back: Nontender to palpation of lumbar spine midline, increased tenderness throughout right lumbar musculature extending into right glutes, strength at hips and knees 5/5 and equal bilaterally, negative straight leg raise  Skin:    General: Skin is warm and dry.     Comments: Left axilla with multiple areas of induration, minimal erythema  Neurological:     Mental Status: She is alert and oriented to person, place, and time.     UC Treatments / Results  Labs (all labs ordered are listed, but only abnormal results are displayed) Labs Reviewed - No data to display  EKG   Radiology No results found.  Procedures Procedures (including critical care time)  Medications Ordered in UC Medications  ketorolac (TORADOL) 30 MG/ML injection 30 mg (30 mg Intramuscular Given 11/27/20 0847)    Initial Impression / Assessment and Plan / UC Course  I have reviewed the triage vital signs and the nursing notes.  Pertinent labs & imaging results that were available during my care of the patient were reviewed by me and considered in my medical decision making (see chart for details).     Low back pain with right-sided radicular distribution-treating with Toradol,  ibuprofen and tizanidine, alternating ice and heat, gentle stretching Abscess, left axilla-course of doxycycline, refilled Hibiclens, warm compresses and monitoring  Discussed strict return precautions. Patient verbalized understanding and is agreeable with plan.  Final Clinical Impressions(s) / UC Diagnoses   Final diagnoses:  Acute right-sided low back pain with right-sided sciatica  Abscess of left axilla     Discharge Instructions      We gave you a shot of toradol Ibuprofen and Tylenol for back pain Supplement with tizanidine at home/bedtime Alternate ice and heat Gentle stretching-see attached  Continue Hibiclens to oral fluids to prevent abscesses Begin doxycycline twice daily for 1 week Warm compresses  Follow-up if not improving or worsening     ED Prescriptions     Medication Sig Dispense Auth. Provider   chlorhexidine (HIBICLENS) 4 % external liquid Apply topically daily as needed. 473 mL Jennamarie Goings C, PA-C   ibuprofen (ADVIL) 800 MG tablet Take 1 tablet (800 mg total) by mouth 3 (three) times daily. 21 tablet Akshara Blumenthal C, PA-C   tiZANidine (ZANAFLEX) 2 MG tablet Take 1-2 tablets (2-4 mg total) by mouth every 6 (six) hours as needed for muscle spasms. 30 tablet Raiyan Dalesandro C, PA-C   doxycycline (VIBRAMYCIN) 100 MG capsule Take 1 capsule (100 mg total) by mouth 2 (two) times daily for 7 days. 14 capsule Ramondo Dietze, [place] C, PA-C      PDMP not reviewed this encounter.   Janith Lima, [place] 11/27/20 [phone]

## 2020-11-27 NOTE — Discharge Instructions (Addendum)
We gave you a shot of toradol Ibuprofen and Tylenol for back pain Supplement with tizanidine at home/bedtime Alternate ice and heat Gentle stretching-see attached  Continue Hibiclens to oral fluids to prevent abscesses Begin doxycycline twice daily for 1 week Warm compresses  Follow-up if not improving or worsening

## 2020-12-15 ENCOUNTER — Other Ambulatory Visit: Payer: Self-pay

## 2020-12-15 ENCOUNTER — Ambulatory Visit (HOSPITAL_COMMUNITY)
Admission: RE | Admit: 2020-12-15 | Discharge: 2020-12-15 | Disposition: A | Discharge status: Home / Self Care | Payer: 59 | Source: Ambulatory Visit | Attending: Nurse Practitioner | Admitting: Nurse Practitioner

## 2020-12-15 ENCOUNTER — Ambulatory Visit (INDEPENDENT_AMBULATORY_CARE_PROVIDER_SITE_OTHER): Payer: 59 | Admitting: Nurse Practitioner

## 2020-12-15 ENCOUNTER — Encounter: Payer: Self-pay | Admitting: Nurse Practitioner

## 2020-12-15 VITALS — BP 114/61 | HR 53 | Temp 96.8°F | Ht 66.0 in | Wt 267.0 lb

## 2020-12-15 DIAGNOSIS — M79604 Pain in right leg: Secondary | ICD-10-CM

## 2020-12-15 DIAGNOSIS — L989 Disorder of the skin and subcutaneous tissue, unspecified: Secondary | ICD-10-CM

## 2020-12-15 NOTE — Progress Notes (Signed)
[address], [place]  [address] Phone:  [phone]   Fax:  [phone]   Established Patient Office Visit  Subjective:  Patient ID: Kathryn Stevenson, female    DOB: [birth year]  Age: 45 y.o. MRN: [id number]  CC:  Chief Complaint  Patient presents with   Back Pain    Was seen at urgent care 6/11 for back pain was given doxycycline , ibuprofen  and zanaflex.  Cut on left pinky toe     HPI Kathryn Stevenson presents for follow up. 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    She was having right leg pain with swelling. She denies any injury. No previous history. She was having numbness and tingling. She stopped walking walking to weeks prior to this happening. She denies any weakness. She is using the [employer] and muscle relaxer and completing the exercises as recommended. This has been effective with the exception of one night.   She is having some left "pinky" toe. She feels like this is 3-4 time. She is has used OTC cream; antifungal, neosporin, gold bond and peroxide. She feels like the later was the most effective.  She has not had any known injury/ She does have pedicures and has refrained from this with flare ups. She describes it as a redness and hardness to the top of the toe. She does have an itch.    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[place])    Morbid obesity ([place])    Pancreatic lesion 06/2018   seen on MR of the liver     Past Surgical History:  Procedure Laterality Date   ANKLE SURGERY Left    BIOPSY  01/15/2019   Procedure: BIOPSY;  Surgeon: Irving Copas., MD;  Location: [facility];  Service: Gastroenterology;;   ESOPHAGOGASTRODUODENOSCOPY (EGD) WITH PROPOFOL N/A 01/15/2019   Procedure: ESOPHAGOGASTRODUODENOSCOPY (EGD) WITH PROPOFOL;  Surgeon: Irving Copas., MD;  Location: [facility];  Service: Gastroenterology;  Laterality: N/A;   FINE NEEDLE ASPIRATION  01/15/2019   Procedure: FINE NEEDLE ASPIRATION (FNA) LINEAR;  Surgeon: Irving Copas., MD;  Location: [facility];  Service: Gastroenterology;;   INDUCED ABORTION     MOUTH SURGERY     TUBAL LIGATION     UPPER ESOPHAGEAL ENDOSCOPIC ULTRASOUND (EUS) N/A 01/15/2019   Procedure: UPPER ESOPHAGEAL ENDOSCOPIC ULTRASOUND (EUS);  Surgeon: Irving Copas., MD;  Location: [facility];  Service: Gastroenterology;  Laterality: N/A;   WHIPPLE PROCEDURE N/A 04/15/2019   Procedure: WHIPPLE PROCEDURE;  Surgeon: Stark Klein, MD;  Location: [facility];  Service: General;  Laterality: N/A;  EPIDURAL    Family History  Problem Relation Age of Onset   Hypertension Mother    Ovarian cancer Maternal Grandmother    Heart attack Maternal Grandmother    Esophageal cancer Neg Hx    Colon cancer Neg Hx    Pancreatic cancer Neg Hx    Stomach cancer Neg Hx    Liver disease Neg Hx     Social History   Socioeconomic History   Marital status: Single  Spouse name: Not on file   Number of children: Not on file   Years of education: Not on file   Highest education level: Not on file  Occupational History   Not on file  Tobacco Use   Smoking status: Former    Packs/day: 0.50    Pack years: 0.00    Types: Cigarettes    Quit date: 12/11/2016    Years since quitting: 4.0   Smokeless tobacco: Never  Vaping Use   Vaping Use: Never used  Substance and Sexual Activity   Alcohol use: Yes    Comment: occasionally   Drug use: Yes    Types: Marijuana   Sexual activity: Yes    Partners: Male    Birth control/protection: Surgical  Other Topics Concern   Not on file   Social History Narrative   Not on file   Social Determinants of Health   Financial Resource Strain: Not on file  Food Insecurity: Not on file  Transportation Needs: Not on file  Physical Activity: Not on file  Stress: Not on file  Social Connections: Not on file  Intimate Partner Violence: Not on file    Outpatient Medications Prior to Visit  Medication Sig Dispense Refill   chlorhexidine (HIBICLENS) 4 % external liquid Apply topically daily as needed. 473 mL 0   hydrochlorothiazide (HYDRODIURIL) 25 MG tablet Take 1 tablet (25 mg total) by mouth daily. 90 tablet 3   ibuprofen (ADVIL) 800 MG tablet Take 1 tablet (800 mg total) by mouth 3 (three) times daily. 21 tablet 0   multivitamin-iron-minerals-folic acid (CENTRUM) chewable tablet Chew 1 tablet by mouth daily.     tiZANidine (ZANAFLEX) 2 MG tablet Take 1-2 tablets (2-4 mg total) by mouth every 6 (six) hours as needed for muscle spasms. 30 tablet 0   pantoprazole (PROTONIX) 20 MG tablet Take 20 mg by mouth daily.     promethazine (PHENERGAN) 12.5 MG tablet Take 12.5 mg by mouth every 6 (six) hours as needed.      clobetasol cream (TEMOVATE) 1.61 % Apply 1 application topically 2 (two) times daily. 30 g 0   No facility-administered medications prior to visit.    No Known Allergies  ROS Review of Systems    Objective:    Physical Exam Constitutional:      Appearance: She is obese.  HENT:     Head: Normocephalic and atraumatic.  Cardiovascular:     Rate and Rhythm: Normal rate and regular rhythm.     Pulses: Normal pulses.     Heart sounds: Normal heart sounds.  Pulmonary:     Effort: Pulmonary effort is normal.     Breath sounds: Normal breath sounds.  Abdominal:     Palpations: Abdomen is soft.  Musculoskeletal:     Cervical back: Normal range of motion.  Skin:    General: Skin is warm and dry.     Capillary Refill: Capillary refill takes less than 2 seconds.     Findings: Lesion present.  Neurological:      General: No focal deficit present.     Mental Status: She is alert and oriented to person, place, and time.   BP 114/61   Pulse (!) 53   Temp (!) 96.8 F (36 C)   Ht 5\' 6"  (1.676 m)   Wt 267 lb 0.4 oz (121.1 kg)   LMP 11/17/2020   SpO2 98%   BMI 43.10 kg/m  Wt Readings from Last 3 Encounters:  12/15/20 267 lb  0.4 oz (121.1 kg)  09/13/20 273 lb (123.8 kg)  05/10/20 276 lb 12.8 oz (125.6 kg)     Health Maintenance Due  Topic Date Due   Pneumococcal Vaccine 37-20 Years old (1 - PCV) Never done   COLONOSCOPY (Pts 45-26yrs Insurance coverage will need to be confirmed)  Never done   COVID-19 Vaccine (3 - Moderna risk series) 11/11/2019   PAP SMEAR-Modifier  12/09/2019    There are no preventive care reminders to display for this patient.  Lab Results  Component Value Date   TSH 1.840 03/04/2020   Lab Results  Component Value Date   WBC 7.6 03/04/2020   HGB 12.9 03/04/2020   HCT 37.7 03/04/2020   MCV 88 03/04/2020   PLT 332 03/04/2020   Lab Results  Component Value Date   NA 136 03/04/2020   K 3.7 03/04/2020   CO2 28 05/16/2019   GLUCOSE 102 (H) 03/04/2020   BUN 8 03/04/2020   CREATININE 0.87 03/04/2020   BILITOT 0.3 03/04/2020   ALKPHOS 87 03/04/2020   AST 27 03/04/2020   ALT 84 (H) 04/23/2019   PROT 6.6 03/04/2020   ALBUMIN 4.0 03/04/2020   CALCIUM 8.9 03/04/2020   ANIONGAP 12 05/16/2019   Lab Results  Component Value Date   CHOL 130 03/04/2020   Lab Results  Component Value Date   HDL 73 03/04/2020   Lab Results  Component Value Date   LDLCALC 43 03/04/2020   Lab Results  Component Value Date   TRIG 69 03/04/2020   Lab Results  Component Value Date   CHOLHDL 1.8 03/04/2020   Lab Results  Component Value Date   HGBA1C 5.4 04/09/2019      Assessment & Plan:   Problem List Items Addressed This Visit       Other   Right leg pain - Primary Worsening further evaluation with images   Relevant Orders   DG HIP UNILAT W OR W/O PELVIS 2-3  VIEWS RIGHT (Completed)   Other Visit Diagnoses     Lesion of skin of foot     Culture pending Continue to use over-the-counter treatment   Relevant Orders   WOUND CULTURE (Completed)       No orders of the defined types were placed in this encounter.   Follow-up: Return for Appointment As Scheduled.    Vevelyn Francois, NP

## 2020-12-21 ENCOUNTER — Telehealth: Payer: Self-pay

## 2020-12-21 NOTE — Telephone Encounter (Signed)
Pt called in w/ Vaginal pain and said they she is swollen. She's at work and cannot make it to UC she wants to know if can get anything prescribe or advice

## 2020-12-22 LAB — WOUND CULTURE

## 2020-12-22 NOTE — Telephone Encounter (Signed)
Patient called office to follow up. Advised patient per Crystal she will need to come in for at least nurse visit to complete swab and give urine sample. Patient stated she was unable to do so because of her job, advised patient that unfortunately we could not send rx with out knowing what is being treated patient got upset and hung up.

## 2021-01-14 ENCOUNTER — Ambulatory Visit (INDEPENDENT_AMBULATORY_CARE_PROVIDER_SITE_OTHER): Payer: 59 | Admitting: Nurse Practitioner

## 2021-01-14 ENCOUNTER — Other Ambulatory Visit: Payer: Self-pay

## 2021-01-14 VITALS — BP 116/75 | HR 50 | Temp 97.3°F | Ht 66.0 in | Wt 278.0 lb

## 2021-01-14 DIAGNOSIS — Z01419 Encounter for gynecological examination (general) (routine) without abnormal findings: Secondary | ICD-10-CM

## 2021-01-14 DIAGNOSIS — N939 Abnormal uterine and vaginal bleeding, unspecified: Secondary | ICD-10-CM

## 2021-01-14 LAB — POCT URINALYSIS DIPSTICK
Bilirubin, UA: NEGATIVE
Blood, UA: NEGATIVE
Glucose, UA: NEGATIVE
Ketones, UA: NEGATIVE
Leukocytes, UA: NEGATIVE
Nitrite, UA: NEGATIVE
Protein, UA: NEGATIVE
Spec Grav, UA: >=1.03 — AB (ref 1.010–1.025)
Urobilinogen, UA: 0.2 E.U./dL
pH, UA: 5.5 (ref 5.0–8.0)

## 2021-01-14 NOTE — Patient Instructions (Signed)
Pap Test Why am I having this test? A Pap test, also called a Pap smear, is a screening test to check for signs of: Cancer of the vagina, cervix, and uterus. The cervix is the lower part of the uterus that opens into the vagina. Infection. Changes that may be a sign that cancer is developing (precancerous changes). Women need this test on a regular basis. In general, you should have a Pap test every 3 years until you reach menopause or age 46. Women aged 30-60 may choose to have their Pap test done at the same time as an HPV (human papillomavirus) test every 5 years (instead of every 3 years). Your health care provider may recommend having Pap tests more or less oftendepending on your medical conditions and past Pap test results. What kind of sample is taken?  Your health care provider will collect a sample of cells from the surface of your cervix. This will be done using a small cotton swab, plastic spatula, or brush. This sample is often collected during a pelvic exam, when you are lying on your back on an exam table with feet in footrests (stirrups). In some cases, fluids (secretions) from the cervix or vagina may also be collected. How do I prepare for this test? Be aware of where you are in your menstrual cycle. If you are menstruating on the day of the test, you may be asked to reschedule. You may need to reschedule if you have a known vaginal infection on the day of the test. Follow instructions from your health care provider about: Changing or stopping your regular medicines. Some medicines can cause abnormal test results, such as digitalis and tetracycline. Avoiding douching or taking a bath the day before or the day of the test. Tell a health care provider about: Any allergies you have. All medicines you are taking, including vitamins, herbs, eye drops, creams, and over-the-counter medicines. Any blood disorders you have. Any surgeries you have had. Any medical conditions you  have. Whether you are pregnant or may be pregnant. How are the results reported? Your test results will be reported as either abnormal or normal. A false-positive result can occur. A false positive is incorrect because itmeans that a condition is present when it is not. A false-negative result can occur. A false negative is incorrect because itmeans that a condition is not present when it is. What do the results mean? A normal test result means that you do not have signs of cancer of the vagina,cervix, or uterus. An abnormal result may mean that you have: Cancer. A Pap test by itself is not enough to diagnose cancer. You will have more tests done in this case. Precancerous changes in your vagina, cervix, or uterus. Inflammation of the cervix. An STD (sexually transmitted disease). A fungal infection. A parasite infection. Talk with your health care provider about what your results mean. Questions to ask your health care provider Ask your health care provider, or the department that is doing the test: When will my results be ready? How will I get my results? What are my treatment options? What other tests do I need? What are my next steps? Summary In general, women should have a Pap test every 3 years until they reach menopause or age 75. Your health care provider will collect a sample of cells from the surface of your cervix. This will be done using a small cotton swab, plastic spatula, or brush. In some cases, fluids (secretions) from the cervix or  vagina may also be collected. This information is not intended to replace advice given to you by your health care provider. Make sure you discuss any questions you have with your healthcare provider. Document Revised: 05/18/2020 Document Reviewed: 02/06/2020 Elsevier Patient Education  2022 [place] Breast self-awareness is knowing how your breasts look and feel. Doing breast self-awareness is important. It  allows you to catch a breast problem early while it is still small and can be treated. All women should do breast self-awareness, including women who have had breast implants. Tell your doctorif you notice a change in your breasts. What you need: A mirror. A well-lit room. How to do a breast self-exam A breast self-exam is one way to learn what is normal for your breasts and tocheck for changes. To do a breast self-exam: Look for changes  Take off all the clothes above your waist. Stand in front of a mirror in a room with good lighting. Put your hands on your hips. Push your hands down. Look at your breasts and nipples in the mirror to see if one breast or nipple looks different from the other. Check to see if: The shape of one breast is different. The size of one breast is different. There are wrinkles, dips, and bumps in one breast and not the other. Look at each breast for changes in the skin, such as: Redness. Scaly areas. Look for changes in your nipples, such as: Liquid around the nipples. Bleeding. Dimpling. Redness. A change in where the nipples are.  Feel for changes  Lie on your back on the floor. Feel each breast. To do this, follow these steps: Pick a breast to feel. Put the arm closest to that breast above your head. Use your other arm to feel the nipple area of your breast. Feel the area with the pads of your three middle fingers by making small circles with your fingers. For the first circle, press lightly. For the second circle, press harder. For the third circle, press even harder. Keep making circles with your fingers at the different pressures as you move down your breast. Stop when you feel your ribs. Move your fingers a little toward the center of your body. Start making circles with your fingers again, this time going up until you reach your collarbone. Keep making up-and-down circles until you reach your armpit. Remember to keep using the three  pressures. Feel the other breast in the same way. Sit or stand in the tub or shower. With soapy water on your skin, feel each breast the same way you did in step 2 when you were lying on the floor.  Write down what you find Writing down what you find can help you remember what to tell your doctor. Write down: What is normal for each breast. Any changes you find in each breast, including: The kind of changes you find. Whether you have pain. Size and location of any lumps. When you last had your menstrual period. General tips Check your breasts every month. If you are breastfeeding, the best time to check your breasts is after you feed your baby or after you use a breast pump. If you get menstrual periods, the best time to check your breasts is 5-7 days after your menstrual period is over. With time, you will become comfortable with the self-exam, and you will begin to know if there are changes in your breasts. Contact a doctor if you: See a change in the shape or  size of your breasts or nipples. See a change in the skin of your breast or nipples, such as red or scaly skin. Have fluid coming from your nipples that is not normal. Find a lump or thick area that was not there before. Have pain in your breasts. Have any concerns about your breast health. Summary Breast self-awareness includes looking for changes in your breasts, as well as feeling for changes within your breasts. Breast self-awareness should be done in front of a mirror in a well-lit room. You should check your breasts every month. If you get menstrual periods, the best time to check your breasts is 5-7 days after your menstrual period is over. Let your doctor know of any changes you see in your breasts, including changes in size, changes on the skin, pain or tenderness, or fluid from your nipples that is not normal. This information is not intended to replace advice given to you by your health care provider. Make sure you  discuss any questions you have with your healthcare provider. Document Revised: 01/22/2018 Document Reviewed: 01/22/2018 Elsevier Patient Education  [place].

## 2021-01-14 NOTE — Progress Notes (Signed)
Established Patient Office Visit  Subjective:  Patient ID: Kathryn Stevenson, female    DOB: [birth year]  Age: 45 y.o. MRN: [id number]  CC:  Chief Complaint  Patient presents with   Gynecologic Exam    HPI Kathryn Stevenson presents for pap test. 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   She reports that she has had an abnormal menstrual cycle this month.  It appears that it has last several weeks.  She was having pain with back pain.  She feels like this has resolved.  She is hopeful things will continue to improve.  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[place])    Morbid obesity ([place])    Pancreatic lesion 06/2018   seen on MR of the liver     Past Surgical History:  Procedure Laterality Date   ANKLE SURGERY Left    BIOPSY  01/15/2019   Procedure: BIOPSY;  Surgeon: Irving Copas., MD;  Location: [facility];  Service: Gastroenterology;;   ESOPHAGOGASTRODUODENOSCOPY (EGD) WITH PROPOFOL N/A 01/15/2019   Procedure: ESOPHAGOGASTRODUODENOSCOPY (EGD) WITH PROPOFOL;  Surgeon: Irving Copas., MD;  Location: [facility];  Service: Gastroenterology;  Laterality: N/A;   FINE NEEDLE ASPIRATION  01/15/2019   Procedure: FINE NEEDLE ASPIRATION (FNA) LINEAR;  Surgeon: Irving Copas., MD;  Location: [facility];  Service: Gastroenterology;;   INDUCED ABORTION     MOUTH SURGERY     TUBAL LIGATION     UPPER ESOPHAGEAL ENDOSCOPIC ULTRASOUND (EUS) N/A 01/15/2019   Procedure: UPPER ESOPHAGEAL ENDOSCOPIC ULTRASOUND (EUS);  Surgeon: Irving Copas., MD;  Location: [facility];  Service:  Gastroenterology;  Laterality: N/A;   WHIPPLE PROCEDURE N/A 04/15/2019   Procedure: WHIPPLE PROCEDURE;  Surgeon: Stark Klein, MD;  Location: [facility];  Service: General;  Laterality: N/A;  EPIDURAL    Family History  Problem Relation Age of Onset   Hypertension Mother    Ovarian cancer Maternal Grandmother    Heart attack Maternal Grandmother    Esophageal cancer Neg Hx    Colon cancer Neg Hx    Pancreatic cancer Neg Hx    Stomach cancer Neg Hx    Liver disease Neg Hx     Social History   Socioeconomic History   Marital status: Single    Spouse name: Not on file   Number of children: Not on file   Years of education: Not on file   Highest education level: Not on file  Occupational History   Not on file  Tobacco Use   Smoking status: Former    Packs/day: 0.50    Types: Cigarettes    Quit date: 12/11/2016    Years since quitting: 4.0   Smokeless tobacco: Never  Vaping Use   Vaping Use: Never used  Substance and Sexual Activity   Alcohol use: Yes    Comment: occasionally   Drug use: Yes    Types: Marijuana   Sexual activity: Yes    Partners: Male    Birth control/protection: Surgical  Other Topics Concern   Not on file  Social History Narrative   Not on file  Social Determinants of Health   Financial Resource Strain: Not on file  Food Insecurity: Not on file  Transportation Needs: Not on file  Physical Activity: Not on file  Stress: Not on file  Social Connections: Not on file  Intimate Partner Violence: Not on file    Outpatient Medications Prior to Visit  Medication Sig Dispense Refill   hydrochlorothiazide (HYDRODIURIL) 25 MG tablet Take 1 tablet (25 mg total) by mouth daily. 90 tablet 3   multivitamin-iron-minerals-folic acid (CENTRUM) chewable tablet Chew 1 tablet by mouth daily.     chlorhexidine (HIBICLENS) 4 % external liquid Apply topically daily as needed. 473 mL 0   ibuprofen (ADVIL) 800 MG tablet Take 1 tablet (800 mg total) by mouth 3 (three)  times daily. 21 tablet 0   tiZANidine (ZANAFLEX) 2 MG tablet Take 1-2 tablets (2-4 mg total) by mouth every 6 (six) hours as needed for muscle spasms. 30 tablet 0   No facility-administered medications prior to visit.    No Known Allergies  ROS Review of Systems    Objective:    Physical Exam Exam conducted with a chaperone present.  Constitutional:      General: She is not in acute distress.    Appearance: She is obese. She is not ill-appearing, toxic-appearing or diaphoretic.  HENT:     Head: Normocephalic and atraumatic.  Genitourinary:    General: Normal vulva.     Vagina: Normal.     Comments: Small area 11 o'clock Neurological:     Mental Status: She is alert.   BP 116/75 (BP Location: Right Arm, Patient Position: Sitting)   Pulse (!) 50   Temp (!) 97.3 F (36.3 C)   Ht '5\' 6"'$  (1.676 m)   Wt 278 lb 0.6 oz (126.1 kg)   LMP 12/24/2020   SpO2 100%   BMI 44.88 kg/m  Wt Readings from Last 3 Encounters:  01/14/21 278 lb 0.6 oz (126.1 kg)  12/15/20 267 lb 0.4 oz (121.1 kg)  09/13/20 273 lb (123.8 kg)     Health Maintenance Due  Topic Date Due   Pneumococcal Vaccine 69-38 Years old (2 - PCV) 09/19/2016   COLONOSCOPY (Pts 45-52yr Insurance coverage will need to be confirmed)  Never done   COVID-19 Vaccine (3 - Moderna risk series) 11/11/2019   PAP SMEAR-Modifier  12/09/2019    There are no preventive care reminders to display for this patient.  Lab Results  Component Value Date   TSH 1.840 03/04/2020   Lab Results  Component Value Date   WBC 7.6 03/04/2020   HGB 12.9 03/04/2020   HCT 37.7 03/04/2020   MCV 88 03/04/2020   PLT 332 03/04/2020   Lab Results  Component Value Date   NA 136 03/04/2020   K 3.7 03/04/2020   CO2 28 05/16/2019   GLUCOSE 102 (H) 03/04/2020   BUN 8 03/04/2020   CREATININE 0.87 03/04/2020   BILITOT 0.3 03/04/2020   ALKPHOS 87 03/04/2020   AST 27 03/04/2020   ALT 84 (H) 04/23/2019   PROT 6.6 03/04/2020   ALBUMIN 4.0  03/04/2020   CALCIUM 8.9 03/04/2020   ANIONGAP 12 05/16/2019   Lab Results  Component Value Date   CHOL 130 03/04/2020   Lab Results  Component Value Date   HDL 73 03/04/2020   Lab Results  Component Value Date   LDLCALC 43 03/04/2020   Lab Results  Component Value Date   TRIG 69 03/04/2020   Lab Results  Component Value  Date   CHOLHDL 1.8 03/04/2020   Lab Results  Component Value Date   HGBA1C 5.4 04/09/2019      Assessment & Plan:   Problem List Items Addressed This Visit   None Visit Diagnoses     Encounter for gynecological examination    -  Primary   Relevant Orders   IGP, CtNg, rfx Aptima HPV ASCU   Urinalysis Dipstick (Completed)   Abnormal vaginal bleeding     Resolved       No orders of the defined types were placed in this encounter.   Follow-up: Return in about 6 months (around 07/17/2021).    Vevelyn Francois, NP

## 2021-01-18 LAB — IGP, CTNG, RFX APTIMA HPV ASCU
Chlamydia, Nuc. Acid Amp: NEGATIVE
Gonococcus by Nucleic Acid Amp: NEGATIVE

## 2021-10-20 ENCOUNTER — Ambulatory Visit (HOSPITAL_COMMUNITY)
Admission: RE | Admit: 2021-10-20 | Discharge: 2021-10-20 | Disposition: A | Discharge status: Home / Self Care | Payer: 59 | Source: Ambulatory Visit | Attending: Internal Medicine | Admitting: Internal Medicine

## 2021-10-20 ENCOUNTER — Encounter (HOSPITAL_COMMUNITY): Payer: Self-pay

## 2021-10-20 ENCOUNTER — Other Ambulatory Visit: Payer: Self-pay | Admitting: Internal Medicine

## 2021-10-20 VITALS — BP 145/89 | HR 56 | Temp 98.2°F | Resp 18

## 2021-10-20 DIAGNOSIS — L02411 Cutaneous abscess of right axilla: Secondary | ICD-10-CM

## 2021-10-20 DIAGNOSIS — J301 Allergic rhinitis due to pollen: Secondary | ICD-10-CM | POA: Diagnosis not present

## 2021-10-20 DIAGNOSIS — Z1231 Encounter for screening mammogram for malignant neoplasm of breast: Secondary | ICD-10-CM

## 2021-10-20 MED ORDER — CETIRIZINE HCL 10 MG PO TABS: 10.0000 mg | ORAL_TABLET | Freq: Every day | ORAL | 1 refills | Status: DC

## 2021-10-20 MED ORDER — AMOXICILLIN-POT CLAVULANATE 875-125 MG PO TABS: 1.0000 | ORAL_TABLET | Freq: Two times a day (BID) | ORAL | 0 refills | Status: DC

## 2021-10-20 MED ORDER — FLUTICASONE PROPIONATE 50 MCG/ACT NA SUSP: NASAL | 1 refills | Status: DC

## 2021-10-20 NOTE — ED Triage Notes (Signed)
Pt presents with begining stages of an abscess under right arm X 2 days. ?

## 2021-10-20 NOTE — Discharge Instructions (Addendum)
You were seen today for your seasonal allergies and your nodules to your right armpit.  I have prescribed an antibiotic to treat any infection that is present to the nodules under your arm.  Please take Augmentin twice daily for the next 7 days.  Please take this medication with food to avoid GI upset.  Follow-up with your primary care provider in the next 2 weeks for close follow-up of your nodules for further evaluation and to ensure that the swelling has been reduced with the antibiotic.  ? ?Please take cetirizine once daily for seasonal allergies.  Please also take Flonase for allergies.  Use 2 puffs of Flonase into each nostril once daily for 1 week, then 1 puff into each nostril daily ongoing for your symptoms. ? ?If you develop any new or worsening symptoms or do not improve in the next 2 to 3 days, please return.  If your symptoms are severe, please go to the emergency room.  Follow-up with your primary care provider for further evaluation and management of your symptoms as well as ongoing wellness visits.  I hope you feel better!  ?

## 2021-10-20 NOTE — ED Provider Notes (Signed)
[facility]    CSN: [id number] Arrival date & time: 10/20/21  0900      History   Chief Complaint Chief Complaint  Patient presents with   Abscess    HPI Kathryn Stevenson is a 46 y.o. female.   Patient presents to urgent care today for evaluation of her seasonal allergies and "boil coming up" in her right axilla region. She has had abscesses in the past to the left axilla, but never to the right axilla. She noticed an abscess begin to grow in her armpit about 2-3 days ago. Reports 6 on a scale of 0-10 pain to right arm pit. States it's red, swollen, warm to the touch, and "squishes" when touched. Denies fever at this time. She states that she has had fevers with her abscesses in the past and wanted to come to urgent care to get it taken care of before it became too large/uncomfortable. She believes that these are similar to the abscesses that she has had drained in the past and her left axilla and has been using warm compresses for the last 3 days without relief.  She is currently taking Claritin for seasonal allergies and at nighttime she will take benadryl. Symptoms started about 1 week ago and she is complaining of sneezing, nasal drainage, and nasal swelling. She also reports watery/itchy eyes, especially in the morning when she walks outside for exercise.    Abscess  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HCC)    Morbid obesity (HCC)    Pancreatic lesion 06/2018   seen on MR of the liver     Patient Active Problem List   Diagnosis Date Noted   Pancreatic cancer (HCC) 04/15/2019   Primary malignant neoplasm of head of pancreas (HCC) 04/15/2019   Acute hepatitis B    Elevated LFTs    Malaise and fatigue    Liver dysfunction 07/13/2018   Pancreatic lesion 07/13/2018   Anxiety 05/15/2017    Former smoker 05/15/2017   Right leg pain 03/29/2017   RUQ abdominal pain 06/30/2016   Liver lesion 06/30/2016   Gastroesophageal reflux disease 06/30/2016   Morbid obesity (HCC) 09/20/2015   Severe obesity (BMI >= 40) (HCC) 09/20/2015   Dermatitis 09/20/2015    Past Surgical History:  Procedure Laterality Date   ANKLE SURGERY Left    BIOPSY  01/15/2019   Procedure: BIOPSY;  Surgeon: Lemar Lofty., MD;  Location: [facility];  Service: Gastroenterology;;   ESOPHAGOGASTRODUODENOSCOPY (EGD) WITH PROPOFOL N/A 01/15/2019   Procedure: ESOPHAGOGASTRODUODENOSCOPY (EGD) WITH PROPOFOL;  Surgeon: Lemar Lofty., MD;  Location: [facility];  Service: Gastroenterology;  Laterality: N/A;   FINE NEEDLE ASPIRATION  01/15/2019   Procedure: FINE NEEDLE ASPIRATION (FNA) LINEAR;  Surgeon: Lemar Lofty., MD;  Location: [facility];  Service: Gastroenterology;;   INDUCED ABORTION     MOUTH SURGERY     TUBAL LIGATION     UPPER ESOPHAGEAL ENDOSCOPIC ULTRASOUND (EUS) N/A 01/15/2019   Procedure: UPPER ESOPHAGEAL ENDOSCOPIC ULTRASOUND (EUS);  Surgeon: Lemar Lofty., MD;  Location: [facility];  Service: Gastroenterology;  Laterality: N/A;   WHIPPLE PROCEDURE N/A 04/15/2019   Procedure: WHIPPLE PROCEDURE;  Surgeon: Almond Lint, MD;  Location: [facility];  Service: General;  Laterality: N/A;  EPIDURAL  OB History     Gravida  6   Para  2   Term  2   Preterm      AB  4   Living  2      SAB  1   IAB  3   Ectopic      Multiple      Live Births               Home Medications    Prior to Admission medications   Medication Sig Start Date End Date Taking? Authorizing Provider  amoxicillin-clavulanate (AUGMENTIN) 875-125 MG tablet Take 1 tablet by mouth every 12 (twelve) hours. 10/20/21  Yes Carlisle Beers, FNP  cetirizine (ZYRTEC) 10 MG tablet Take 1 tablet (10 mg total) by mouth daily. 10/20/21  Yes Carlisle Beers, FNP  fluticasone  (FLONASE) 50 MCG/ACT nasal spray Place 2 sprays into both nostrils daily for 7 days, THEN 1 spray daily. 10/20/21 12/26/21 Yes Floyd Wade, Donavan Burnet, FNP  hydrochlorothiazide (HYDRODIURIL) 25 MG tablet Take 1 tablet (25 mg total) by mouth daily. 09/13/20   Barbette Merino, NP  multivitamin-iron-minerals-folic acid (CENTRUM) chewable tablet Chew 1 tablet by mouth daily.    [provider]    Family History Family History  Problem Relation Age of Onset   Hypertension Mother    Ovarian cancer Maternal Grandmother    Heart attack Maternal Grandmother    Esophageal cancer Neg Hx    Colon cancer Neg Hx    Pancreatic cancer Neg Hx    Stomach cancer Neg Hx    Liver disease Neg Hx     Social History Social History   Tobacco Use   Smoking status: Former    Packs/day: 0.50    Types: Cigarettes    Quit date: 12/11/2016    Years since quitting: 4.8   Smokeless tobacco: Never  Vaping Use   Vaping Use: Never used  Substance Use Topics   Alcohol use: Yes    Comment: occasionally   Drug use: Yes    Types: Marijuana     Allergies   Patient has no known allergies.   Review of Systems Review of Systems   Physical Exam Triage Vital Signs ED Triage Vitals  Enc Vitals Group     BP 10/20/21 0942 (!) 145/89     Pulse Rate 10/20/21 0942 (!) 56     Resp 10/20/21 0942 18     Temp 10/20/21 0942 98.2 F (36.8 C)     Temp Source 10/20/21 0942 Oral     SpO2 10/20/21 0942 99 %     Weight --      Height --      Head Circumference --      Peak Flow --      Pain Score 10/20/21 0943 5     Pain Loc --      Pain Edu? --      Excl. in GC? --    No data found.  Updated Vital Signs BP (!) 145/89 (BP Location: Right Arm)   Pulse (!) 56   Temp 98.2 F (36.8 C) (Oral)   Resp 18   LMP 09/24/2021   SpO2 99%   Visual Acuity Right Eye Distance:   Left Eye Distance:   Bilateral Distance:    Right Eye Near:   Left Eye Near:    Bilateral Near:     Physical Exam Vitals and  nursing note reviewed. Exam conducted with a chaperone present.  Constitutional:      General: She is not in acute distress.    Appearance: Normal appearance. She is well-developed. She is not ill-appearing.  HENT:     Head: Normocephalic and atraumatic.     Right Ear: Tympanic membrane, ear canal and external ear normal.     Left Ear: Tympanic membrane, ear canal and external ear normal.     Nose: Congestion and rhinorrhea present.     Mouth/Throat:     Mouth: Mucous membranes are moist.     Pharynx: Posterior oropharyngeal erythema present.     Comments: Minimal posterior oropharynx erythema and small amount of postnasal drainage noted to patient's posterior oropharynx Eyes:     Extraocular Movements: Extraocular movements intact.     Conjunctiva/sclera: Conjunctivae normal.  Cardiovascular:     Rate and Rhythm: Normal rate and regular rhythm.     Heart sounds: Normal heart sounds. No murmur heard.   No friction rub. No gallop.  Pulmonary:     Effort: Pulmonary effort is normal. No respiratory distress.     Breath sounds: Normal breath sounds. No wheezing, rhonchi or rales.  Chest:     Chest wall: No tenderness.     Comments: No nipple discharge, dimpling, swelling, or ecchymosis to breasts bilaterally. Abdominal:     General: Bowel sounds are normal.     Palpations: Abdomen is soft.     Tenderness: There is no abdominal tenderness. There is no right CVA tenderness or left CVA tenderness.  Musculoskeletal:        General: No swelling.     Cervical back: Neck supple.  Lymphadenopathy:     Cervical: No cervical adenopathy.  Skin:    General: Skin is warm and dry.     Capillary Refill: Capillary refill takes less than 2 seconds.     Findings: No rash.     Comments: Approximately 4 fixed and firm nodules underneath patient's right axilla, 1 of which extends into patient's right upper extremity posteriorly.  Nodules are approximately 1 inch in diameter.  She is tender to palpation  of these nodules.   Neurological:     General: No focal deficit present.     Mental Status: She is alert and oriented to person, place, and time.     Motor: No weakness.     Gait: Gait normal.  Psychiatric:        Mood and Affect: Mood normal.        Behavior: Behavior normal.        Thought Content: Thought content normal.        Judgment: Judgment normal.     UC Treatments / Results  Labs (all labs ordered are listed, but only abnormal results are displayed) Labs Reviewed - No data to display  EKG   Radiology No results found.  Procedures Procedures (including critical care time)  Medications Ordered in UC Medications - No data to display  Initial Impression / Assessment and Plan / UC Course  I have reviewed the triage vital signs and the nursing notes.  Pertinent labs & imaging results that were available during my care of the patient were reviewed by me and considered in my medical decision making (see chart for details).  Patient is a 46 year old female who presents to urgent care today with concern for new abscesses underneath her right axilla region.  Physical exam reveals approximately 4 fixed and firm cyst like nodules that are approximately 1 inch in diameter and spherical.  Nodules  are not fluctuant, but are tender to palpation.  No cervical or occipital lymphadenopathy appreciated to physical exam.  Patient denies nipple discharge, nipple dimpling, breast pain, skin dimpling, color change to her breasts, or any new breast lumps.  She does state that she is due for a mammogram and plans on calling to make an appointment for one today.  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   Plan to treat with Augmentin for 7 days to cover for possible infection to the nodules given patient's history and physical exam findings of warmth and swelling to the area.  She denies drainage  from the area at this time and fever.  Patient instructed to follow-up with her primary care provider in the next 2 weeks for close monitoring of nodules.  Also instructed to call to get an appointment for a mammogram today and stay up-to-date on her breast cancer screenings.   Plan to treat seasonal allergies with cetirizine 10 mg daily as well as Flonase daily.  Patient instructed to use 2 puffs of Flonase into her nostrils daily for 1 week.  She can then reduce the dose to 1 puff of Flonase in her nostrils bilaterally daily ongoing for allergic rhinitis symptoms.  Counseled patient regarding appropriate use of medications and potential side effects for all medications recommended or prescribed today.  Discussed red flag signs and symptoms of worsening condition,when to call the PCP office, return to urgent care, and when to seek higher level of care. Patient verbalizes understanding and agreement with plan. All questions answered. Patient discharged in stable condition.   Final Clinical Impressions(s) / UC Diagnoses   Final diagnoses:  Seasonal allergic rhinitis due to pollen  Abscess of axilla, right     Discharge Instructions      You were seen today for your seasonal allergies and your nodules to your right armpit.  I have prescribed an antibiotic to treat any infection that is present to the nodules under your arm.  Please take Augmentin twice daily for the next 7 days.  Please take this medication with food to avoid GI upset.  Follow-up with your primary care provider in the next 2 weeks for close follow-up of your nodules for further evaluation and to ensure that the swelling has been reduced with the antibiotic.   Please take cetirizine once daily for seasonal allergies.  Please also take Flonase for allergies.  Use 2 puffs of Flonase into each nostril once daily for 1 week, then 1 puff into each nostril daily ongoing for your symptoms.  If you develop any new or worsening symptoms or  do not improve in the next 2 to 3 days, please return.  If your symptoms are severe, please go to the emergency room.  Follow-up with your primary care provider for further evaluation and management of your symptoms as well as ongoing wellness visits.  I hope you feel better!      ED Prescriptions     Medication Sig Dispense Auth. Provider   cetirizine (ZYRTEC) 10 MG tablet Take 1 tablet (10 mg total) by mouth daily. 30 tablet Reita May M, FNP   fluticasone [facility]) 50 MCG/ACT nasal spray Place 2 sprays into both nostrils daily for 7 days, THEN 1 spray daily. 16 g Reita May M, FNP   amoxicillin-clavulanate (AUGMENTIN) 875-125 MG tablet Take 1 tablet by mouth every 12 (twelve) hours. 14 tablet  Carlisle Beers, FNP      PDMP not reviewed this encounter.   Carlisle Beers, [place] 10/20/21 1046

## 2021-10-27 ENCOUNTER — Ambulatory Visit
Admission: RE | Admit: 2021-10-27 | Discharge: 2021-10-27 | Disposition: A | Discharge status: Home / Self Care | Payer: 59 | Source: Ambulatory Visit | Attending: Internal Medicine | Admitting: Internal Medicine

## 2021-10-27 DIAGNOSIS — Z1231 Encounter for screening mammogram for malignant neoplasm of breast: Secondary | ICD-10-CM

## 2021-11-24 ENCOUNTER — Emergency Department (HOSPITAL_COMMUNITY): Payer: 59

## 2021-11-24 ENCOUNTER — Encounter (HOSPITAL_COMMUNITY): Payer: Self-pay | Admitting: Emergency Medicine

## 2021-11-24 ENCOUNTER — Other Ambulatory Visit: Payer: Self-pay

## 2021-11-24 ENCOUNTER — Emergency Department (HOSPITAL_COMMUNITY)
Admission: EM | Admit: 2021-11-24 | Discharge: 2021-11-24 | Disposition: A | Discharge status: Home / Self Care | Payer: 59 | Attending: Emergency Medicine | Admitting: Emergency Medicine

## 2021-11-24 DIAGNOSIS — R519 Headache, unspecified: Secondary | ICD-10-CM | POA: Diagnosis present

## 2021-11-24 DIAGNOSIS — I10 Essential (primary) hypertension: Secondary | ICD-10-CM | POA: Insufficient documentation

## 2021-11-24 DIAGNOSIS — R002 Palpitations: Secondary | ICD-10-CM | POA: Insufficient documentation

## 2021-11-24 DIAGNOSIS — H93A3 Pulsatile tinnitus, bilateral: Secondary | ICD-10-CM | POA: Insufficient documentation

## 2021-11-24 DIAGNOSIS — Z79899 Other long term (current) drug therapy: Secondary | ICD-10-CM | POA: Insufficient documentation

## 2021-11-24 DIAGNOSIS — R001 Bradycardia, unspecified: Secondary | ICD-10-CM | POA: Insufficient documentation

## 2021-11-24 LAB — BASIC METABOLIC PANEL
Anion gap: 8 (ref 5–15)
BUN: 9 mg/dL (ref 6–20)
CO2: 23 mmol/L (ref 22–32)
Calcium: 8.5 mg/dL — ABNORMAL LOW (ref 8.9–10.3)
Chloride: 107 mmol/L (ref 98–111)
Creatinine, Ser: 0.84 mg/dL (ref 0.44–1.00)
GFR, Estimated: >60 mL/min (ref >60)
Glucose, Bld: 99 mg/dL (ref 70–99)
Potassium: 3.9 mmol/L (ref 3.5–5.1)
Sodium: 138 mmol/L (ref 135–145)

## 2021-11-24 LAB — CBC
HCT: 34.3 % — ABNORMAL LOW (ref 36.0–46.0)
Hemoglobin: 12.5 g/dL (ref 12.0–15.0)
MCH: 30.3 pg (ref 26.0–34.0)
MCHC: 36.4 g/dL — ABNORMAL HIGH (ref 30.0–36.0)
MCV: 83.1 fL (ref 80.0–100.0)
Platelets: 263 10*3/uL (ref 150–400)
RBC: 4.13 MIL/uL (ref 3.87–5.11)
RDW: 14.9 % (ref 11.5–15.5)
WBC: 5.8 10*3/uL (ref 4.0–10.5)
nRBC: 0 % (ref 0.0–0.2)

## 2021-11-24 LAB — TROPONIN I (HIGH SENSITIVITY): Troponin I (High Sensitivity): 7 ng/L (ref <18)

## 2021-11-24 LAB — I-STAT BETA HCG BLOOD, ED (MC, WL, AP ONLY): I-stat hCG, quantitative: <5 m[IU]/mL (ref <5)

## 2021-11-24 NOTE — Discharge Instructions (Addendum)
You were seen in the ER for evaluation of your headache and hearing your pulse in your ears. You have declined IV fluids and medication management for your headache as it is currently not there. You have also declined a CT scan. I have included the information for a cardiologist to this chart for you to call to schedule an appointment if needed.  If you have any chest pain, shortness of breath, palpitations again, headache, blurry vision, neck stiffness, fever, please return to the nearest emergency department for reevaluation. Please follow up with your PCP office tomorrow.   Contact a health care provider if: Your pain does not improve, even with medicine. Your migraines continue to return, even with medicine. Get help right away if: Your migraine becomes severe and medicine does not help. You have a stiff neck and fever. You have a loss of vision. You have muscle weakness or loss of muscle control. You start losing your balance, or you have trouble walking. You feel like you may faint, or you faint. You start having sudden and unexpected, severe headaches. You have a seizure.

## 2021-11-24 NOTE — ED Provider Notes (Signed)
[facility] Provider Note   CSN: [id number] Arrival date & time: 11/24/21  8546     History Chief Complaint  Patient presents with   Headache   Hypertension    Kathryn Stevenson is a 46 y.o. female with history of Whipple and hepatitis B presents the emergency department for multiple complaints.  Patient reports that she has had a headache above her bilateral eyebrows intermittently for the past week.  She reports that it is when she first wakes up and gets better throughout the day.  She also reports that she has been hearing the pulse through both of her ears at night when she is laying in bed and sleep, but not when she is up walking.  She reports that at some point this week she did feel her heart fluttering but she thinks it is just her anxiety.  She denies any blurry vision, chest pain, shortness of breath, weakness, abdominal pain, nausea, or vomiting.  Patient reports that her headache is relieved with Excedrin.  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  Additionally, she mentions that she has had higher blood pressures with systolically in the 270J.  She felt the need to check it as she was hearing her pulse in her ear sympathetic her blood pressure was high.  Denies any unilateral weakness or any fever cough or cold symptoms.  She denies any daily medications.  No known drug allergies.  Denies any tobacco or EtOH.  Mentions marijuana use.  Headache Associated symptoms: no abdominal pain, no congestion, no cough, no diarrhea, no fever, no nausea, no neck pain, no photophobia, no sore throat, no vomiting and no weakness   Hypertension Associated symptoms include headaches. Pertinent negatives include no chest pain, no abdominal pain and no shortness of breath.       Home Medications Prior to Admission medications   Medication Sig Start Date End Date Taking? Authorizing Provider   amoxicillin-clavulanate (AUGMENTIN) 875-125 MG tablet Take 1 tablet by mouth every 12 (twelve) hours. 10/20/21   Talbot Grumbling, FNP  cetirizine (ZYRTEC) 10 MG tablet Take 1 tablet (10 mg total) by mouth daily. 10/20/21   Talbot Grumbling, FNP  fluticasone (FLONASE) 50 MCG/ACT nasal spray Place 2 sprays into both nostrils daily for 7 days, THEN 1 spray daily. 10/20/21 12/26/21  Talbot Grumbling, FNP  hydrochlorothiazide (HYDRODIURIL) 25 MG tablet Take 1 tablet (25 mg total) by mouth daily. 09/13/20   Vevelyn Francois, NP  multivitamin-iron-minerals-folic acid (CENTRUM) chewable tablet Chew 1 tablet by mouth daily.    [provider]      Allergies    Patient has no known allergies.    Review of Systems   Review of Systems  Constitutional:  Negative for chills and fever.  HENT:  Positive for tinnitus. Negative for congestion, rhinorrhea and sore throat.   Eyes:  Negative for photophobia and visual disturbance.  Respiratory:  Negative for cough and shortness of breath.   Cardiovascular:  Positive for palpitations. Negative for chest pain.  Gastrointestinal:  Negative for abdominal pain, diarrhea, nausea and vomiting.  Musculoskeletal:  Negative for neck pain.  Neurological:  Positive for headaches. Negative for syncope, weakness and light-headedness.    Physical Exam Updated Vital Signs BP (!) 180/101 (BP Location: Right Arm)   Pulse (!) 49   Temp 98.6 F (37 C) (Oral)   Resp 16   Ht  $'5\' 6"'A$  (1.676 m)   LMP 10/23/2021 (Exact Date)   SpO2 100%   BMI 44.88 kg/m  Physical Exam Vitals and nursing note reviewed.  Constitutional:      General: She is not in acute distress.    Appearance: Normal appearance. She is not toxic-appearing.  HENT:     Head: Normocephalic and atraumatic.     Mouth/Throat:     Mouth: Mucous membranes are moist.  Eyes:     General: No scleral icterus.    Extraocular Movements: Extraocular movements intact.     Pupils: Pupils are equal,  round, and reactive to light.  Cardiovascular:     Rate and Rhythm: Normal rate and regular rhythm.  Pulmonary:     Effort: Pulmonary effort is normal. No respiratory distress.     Breath sounds: Normal breath sounds.  Abdominal:     General: Abdomen is flat.  Musculoskeletal:        General: No tenderness or deformity.     Cervical back: Normal range of motion. No rigidity.  Lymphadenopathy:     Cervical: No cervical adenopathy.  Skin:    General: Skin is warm and dry.     Capillary Refill: Capillary refill takes less than 2 seconds.  Neurological:     General: No focal deficit present.     Mental Status: She is alert. Mental status is at baseline.     GCS: GCS eye subscore is 4. GCS verbal subscore is 5. GCS motor subscore is 6.     Cranial Nerves: No cranial nerve deficit, dysarthria or facial asymmetry.     Sensory: No sensory deficit.     Motor: No weakness or pronator drift.     Coordination: Finger-Nose-Finger Test normal.     Comments: Pain 5 out of 5 in patient's upper and lower bilateral extremities.  Sensation intact throughout.     ED Results / Procedures / Treatments   Labs (all labs ordered are listed, but only abnormal results are displayed) Labs Reviewed  BASIC METABOLIC PANEL - Abnormal; Notable for the following components:      Result Value   Calcium 8.5 (*)    All other components within normal limits  CBC - Abnormal; Notable for the following components:   HCT 34.3 (*)    MCHC 36.4 (*)    All other components within normal limits  I-STAT BETA HCG BLOOD, ED ([facility], [facility], [facility] ONLY)  TROPONIN I (HIGH SENSITIVITY)  TROPONIN I (HIGH SENSITIVITY)    EKG EKG Interpretation  Date/Time:  Thursday November 24 2021 07:05:28 EDT Ventricular Rate:  49 PR Interval:  174 QRS Duration: 74 QT Interval:  432 QTC Calculation: 390 R Axis:   1 Text Interpretation: Sinus bradycardia Cannot rule out Anterior infarct , age undetermined Abnormal ECG When compared with ECG of  16-May-2019 01:03, PREVIOUS ECG IS PRESENT when compared to prior, slower rate. No STEMI Confirmed by Antony Blackbird [phone]) on 11/24/2021 8:24:33 AM  Radiology DG Chest 2 View  Result Date: 11/24/2021 CLINICAL DATA:  Palpitations, headache, hypertension EXAM: CHEST - 2 VIEW COMPARISON:  04/17/2019 FINDINGS: The heart size and mediastinal contours are within normal limits. Both lungs are clear. The visualized skeletal structures are unremarkable. IMPRESSION: No active cardiopulmonary disease. Electronically Signed   By: Jerilynn Mages.  Shick M.D.   On: 11/24/2021 07:53    Procedures Procedures   Medications Ordered in ED Medications - No data to display  ED Course/ Medical Decision Making/ A&P  Medical Decision Making Amount and/or Complexity of Data Reviewed Labs: ordered. Radiology: ordered.   46 year old female presents the emergency department for evaluation of headache, pulsatile tinnitus, elevated blood pressure, intermittent palpitations for the past week.  Differential diagnosis includes was not limited to tension type headache, space-occupying lesion, migraine, dehydration, electro abnormality, pulsatile tinnitus, elevated blood pressure, stroke.  Vital signs show elevated blood pressure at 156/86, afebrile, bradycardia although this is chronic for patient looking at previous notes in the past year.  Satting well on room air without any increased work of breathing.  Physical exam is otherwise unremarkable.  Patient is well-appearing in no acute distress.  Ears are clear.  Face is atraumatic.  Lungs are clear to auscultation.  Regular rate and rhythm.  Normal neurological exam.  GCS 15.  No cranial nerve deficit.  Patient is speaking in full sentences with ease.  She is answering questions appropriate with appropriate speech.  No dysarthria.  Sensation intact throughout.  No pronator drift.  Strength is 5 out of 5 in patient's upper and lower bilateral extremities.  Normal  finger-nose.  I independently reviewed and interpreted the patient's labs.  BMP shows slight decrease in calcium at 8.5 otherwise no electrolyte abnormality.  CBC shows no anemia or leukocytosis.  hCG negative.  Troponin is 7.  Given the patient has had symptoms for over a few days with the palpitations as her last one, she is out of any threshold.  1 troponin is necessary as patient does not have any chest pain or shortness of breath.  No EKG differences other than slightly slower rate.  Chest x-ray shows no acute cardiopulmonary process.  I had a shared decision-making with the patient on what we could do for her headaches to provide her some peace of mind given that she has anxiety about them.  I discussed with her that I can do a CT imaging on her head and or do a migraine cocktail although she is not having a headache right now and or follow-up with her PCP for blood pressure management.  The patient would like to forego the CT scan and migraine cocktail images like to follow-up with her PCP tomorrow.  I think this is reasonable.  I discussed with her that pulsatile tinnitus can be normal, however it is reassuring that her headaches are relieved with her Excedrin.  She reports that she has an appointment with PCP tomorrow to be started on blood pressure medication.  Although I think her palpitations are mostly anxiety, and so it is the patient, I have listed a cardiologist for her to follow-up with if needed.  I also advised her to mention this to her PCP for possible Holter monitor if she sees necessary.  We discussed in the meantime strict return precautions and red flag symptoms.  Patient agrees to plan.  Patient is stable being discharged home in good condition.  I discussed this case with my attending physician who cosigned this note including patient's presenting symptoms, physical exam, and planned diagnostics and interventions. Attending physician stated agreement with plan or made changes to plan  which were implemented.   Final Clinical Impression(s) / ED Diagnoses Final diagnoses:  Bad headache  Palpitation  Pulsatile tinnitus of both ears    Rx / DC Orders ED Discharge Orders     None         Sherrell Puller, PA-C 11/24/21 1423    Tegeler, Gwenyth Allegra, MD 11/24/21 [phone]

## 2021-11-24 NOTE — ED Notes (Signed)
Pt reports feeling like her heart was fluttering and headache. Pt checked her BP at home and found it to be high. Pt reports she is normally bradycardic in the 40's to 50's and that her SBP is normally around 118. Pt reports no diagnosis of HTN, but reports family hx of such. Pt denies any chest fluttering at this time, just a headache.

## 2021-11-24 NOTE — ED Triage Notes (Signed)
Pt states she feels like her heart is racing/fluttering, has a bad headache, and her bp has been elevated since Tuesday. Denies dizziness, sob, chest pain.

## 2021-11-24 NOTE — ED Notes (Signed)
Reviewed discharge instructions with patient. Follow-up care reviewed. Patient verbalized understanding. Patient A&Ox4, VSS, and ambulatory with steady gait upon discharge.  

## 2021-11-25 ENCOUNTER — Encounter: Payer: Self-pay | Admitting: Nurse Practitioner

## 2021-11-25 ENCOUNTER — Ambulatory Visit (INDEPENDENT_AMBULATORY_CARE_PROVIDER_SITE_OTHER): Payer: 59 | Admitting: Nurse Practitioner

## 2021-11-25 VITALS — BP 138/87 | HR 50 | Temp 97.9°F | Ht 66.0 in | Wt 288.2 lb

## 2021-11-25 DIAGNOSIS — L732 Hidradenitis suppurativa: Secondary | ICD-10-CM | POA: Diagnosis not present

## 2021-11-25 DIAGNOSIS — Z6841 Body Mass Index (BMI) 40.0 and over, adult: Secondary | ICD-10-CM

## 2021-11-25 DIAGNOSIS — R030 Elevated blood-pressure reading, without diagnosis of hypertension: Secondary | ICD-10-CM | POA: Diagnosis not present

## 2021-11-25 DIAGNOSIS — Z09 Encounter for follow-up examination after completed treatment for conditions other than malignant neoplasm: Secondary | ICD-10-CM

## 2021-11-25 DIAGNOSIS — E66813 Obesity, class 3: Secondary | ICD-10-CM

## 2021-11-25 MED ORDER — DOXYCYCLINE HYCLATE 100 MG PO TABS: 100.0000 mg | ORAL_TABLET | Freq: Two times a day (BID) | ORAL | 1 refills | Status: DC

## 2021-11-25 MED ORDER — LISINOPRIL 20 MG PO TABS: 20.0000 mg | ORAL_TABLET | Freq: Every day | ORAL | 3 refills | Status: DC

## 2021-11-25 MED ORDER — CHLORHEXIDINE GLUCONATE 4 % EX LIQD: Freq: Every day | CUTANEOUS | 0 refills | Status: DC | PRN

## 2021-11-25 NOTE — Patient Instructions (Signed)
You were seen today in the [facility] for multiple complaints. Labs were collected, results will be available via MyChart or, if abnormal, you will be contacted by clinic staff. You were prescribed medications, please take as directed. Please follow up in 2 mths for reevaluation of HTN, anxiety and weight  management

## 2021-11-25 NOTE — Progress Notes (Unsigned)
[address] [place], [place]  [address] Phone:  [phone]   Fax:  [phone] Subjective:   Patient ID: Kathryn Stevenson, female    DOB: 10-14-1975, 46 y.o.   MRN: [id number]  Chief Complaint  Patient presents with  . Hospitalization Follow-up    Pt is here for hospital follow up. Pt stated she is concern about her BP pt also has a boil on her vagina would like doxycyline for it. Pt is requesting help with  weight management and anxiety    HPI 51 Kathryn Stevenson 46 y.o. female  has a past medical history of Acid reflux, Dermatitis, Fracture, ankle, GERD (gastroesophageal reflux disease), Hepatic hemangioma, Hepatitis B, Malignant neoplasm of head of pancreas ([place]), Morbid obesity ([place]), and Pancreatic lesion (06/2018). To the [facility] for hospital follow up.   Went to the ED yesterday for HA and elevated B/P. Continues to have a HA today, but suspects its related to not eating. Also suspects symptoms were related to anxiety. Was treated for anxiety in the past, but uncertain of medications she was given in the past for management.   Also concerned about boil to right labia x 1 wk. Endorses some clear drainage from site. Area has decreased in size, suspects it is improving and no longer infectious. Requesting prescription for doxycycline. Last treatment for vaginal boil within the past year.   Endorses shaving, states that she understands the risk of continued shaving, but it is her preferred method of hygiene at this time.   Patient requesting referral to weight management, ideally weight loss surgery. Has a current weight loss goal of 100 lbs. States that she was recently diagnosed with plantar fascitis, which has impacted her ability to exercise regularly. Denies any other concerns today.   Denies any fatigue, chest pain, shortness of breath, HA or dizziness. Denies any blurred vision, numbness or tingling.   Past Medical History:  Diagnosis Date  .  Acid reflux   . Dermatitis   . Fracture, ankle   . GERD (gastroesophageal reflux disease)   . Hepatic hemangioma   . Hepatitis B   . 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 Morbid obesity ([place])   . Pancreatic lesion 06/2018   seen on MR of the liver     Past Surgical History:  Procedure Laterality Date  . ANKLE SURGERY Left   . BIOPSY  01/15/2019   Procedure: BIOPSY;  Surgeon: Rush Landmark Telford Nab., MD;  Location: [facility];  Service: Gastroenterology;;  . ESOPHAGOGASTRODUODENOSCOPY (EGD) WITH PROPOFOL N/A 01/15/2019   Procedure: ESOPHAGOGASTRODUODENOSCOPY (EGD) WITH PROPOFOL;  Surgeon: Irving Copas., MD;  Location: [facility];  Service: Gastroenterology;  Laterality: N/A;  . FINE NEEDLE ASPIRATION  01/15/2019   Procedure: FINE NEEDLE ASPIRATION (FNA) LINEAR;  Surgeon: Irving Copas., MD;  Location: [facility];  Service: Gastroenterology;;  . INDUCED ABORTION    . MOUTH SURGERY    . TUBAL LIGATION    . UPPER ESOPHAGEAL ENDOSCOPIC ULTRASOUND (EUS) N/A 01/15/2019   Procedure: UPPER ESOPHAGEAL ENDOSCOPIC ULTRASOUND (EUS);  Surgeon: Irving Copas., MD;  Location: [facility];  Service: Gastroenterology;  Laterality: N/A;  . WHIPPLE PROCEDURE N/A 04/15/2019   Procedure: WHIPPLE PROCEDURE;  Surgeon: Stark Klein, MD;  Location: [facility] OR;  Service: General;  Laterality: N/A;  EPIDURAL    Family History  Problem Relation Age of Onset  . Hypertension Mother   . Ovarian cancer Maternal Grandmother   . Heart  attack Maternal Grandmother   . Esophageal cancer Neg Hx   . Colon cancer Neg Hx   . Pancreatic cancer Neg Hx   . Stomach cancer Neg Hx   . Liver disease Neg Hx     Social History   Socioeconomic History  . Marital status: Single    Spouse name: Not on file  . Number of children: Not on file  . Years of education: Not on file  . Highest education level: Not on file  Occupational History  . Not on file  Tobacco Use  . Smoking  status: Former    Packs/day: 0.50    Types: Cigarettes    Quit date: 12/11/2016    Years since quitting: 4.9  . Smokeless tobacco: Never  Vaping Use  . Vaping Use: Never used  Substance and Sexual Activity  . Alcohol use: Yes    Comment: occasionally  . Drug use: Yes    Types: Marijuana  . Sexual activity: Yes    Partners: Male    Birth control/protection: Surgical  Other Topics Concern  . Not on file  Social History Narrative  . Not on file   Social Determinants of Health   Financial Resource Strain: Not on file  Food Insecurity: Not on file  Transportation Needs: Not on file  Physical Activity: Not on file  Stress: Not on file  Social Connections: Not on file  Intimate Partner Violence: Not on file    Outpatient Medications Prior to Visit  Medication Sig Dispense Refill  . cetirizine (ZYRTEC) 10 MG tablet Take 1 tablet (10 mg total) by mouth daily. 30 tablet 1  . fluticasone (FLONASE) 50 MCG/ACT nasal spray Place 2 sprays into both nostrils daily for 7 days, THEN 1 spray daily. 16 g 1  . multivitamin-iron-minerals-folic acid (CENTRUM) chewable tablet Chew 1 tablet by mouth daily.    Marland Kitchen amoxicillin-clavulanate (AUGMENTIN) 875-125 MG tablet Take 1 tablet by mouth every 12 (twelve) hours. (Patient not taking: Reported on 11/25/2021) 14 tablet 0  . hydrochlorothiazide (HYDRODIURIL) 25 MG tablet Take 1 tablet (25 mg total) by mouth daily. (Patient not taking: Reported on 11/25/2021) 90 tablet 3   No facility-administered medications prior to visit.    No Known Allergies  Review of Systems  Constitutional:  Negative for chills, fever and malaise/fatigue.  Respiratory:  Negative for cough and shortness of breath.   Cardiovascular:  Negative for chest pain, palpitations and leg swelling.  Gastrointestinal:  Negative for abdominal pain, blood in stool, constipation, diarrhea, nausea and vomiting.  Musculoskeletal: Negative.   Skin: Negative.  Negative for itching and rash.        See HPI/ boil  Neurological:  Positive for headaches.  Psychiatric/Behavioral:  Negative for depression. The patient is nervous/anxious.   All other systems reviewed and are negative.      Objective:    Physical Exam Vitals reviewed.  Constitutional:      General: She is not in acute distress.    Appearance: Normal appearance. She is obese.  HENT:     Head: Normocephalic.  Neck:     Vascular: No carotid bruit.  Cardiovascular:     Rate and Rhythm: Normal rate and regular rhythm.     Pulses: Normal pulses.     Heart sounds: Normal heart sounds.     Comments: No obvious peripheral edema Pulmonary:     Effort: Pulmonary effort is normal.     Breath sounds: Normal breath sounds.  Genitourinary:    Comments:  Exam deferred  Musculoskeletal:        General: No swelling, tenderness, deformity or signs of injury. Normal range of motion.     Cervical back: Neck supple. No rigidity or tenderness.     Right lower leg: No edema.     Left lower leg: No edema.  Lymphadenopathy:     Cervical: No cervical adenopathy.  Skin:    General: Skin is warm and dry.     Capillary Refill: Capillary refill takes less than 2 seconds.  Neurological:     General: No focal deficit present.     Mental Status: She is alert and oriented to person, place, and time.  Psychiatric:        Mood and Affect: Mood normal.        Behavior: Behavior normal.        Thought Content: Thought content normal.        Judgment: Judgment normal.    BP 138/87 (BP Location: Right Arm, Patient Position: Sitting, Cuff Size: Large)   Pulse (!) 50   Temp 97.9 F (36.6 C)   Ht '5\' 6"'$  (1.676 m)   Wt 288 lb 3.2 oz (130.7 kg)   LMP 10/23/2021 (Exact Date)   SpO2 100%   BMI 46.52 kg/m  Wt Readings from Last 3 Encounters:  11/25/21 288 lb 3.2 oz (130.7 kg)  01/14/21 278 lb 0.6 oz (126.1 kg)  12/15/20 267 lb 0.4 oz (121.1 kg)    Immunization History  Administered Date(s) Administered  . Moderna Sars-Covid-2  Vaccination 09/11/2019, 10/14/2019  . Pneumococcal Polysaccharide-23 09/20/2015  . Tdap 09/20/2015    Diabetic Foot Exam - Simple   No data filed     Lab Results  Component Value Date   TSH 1.840 03/04/2020   Lab Results  Component Value Date   WBC 5.8 11/24/2021   HGB 12.5 11/24/2021   HCT 34.3 (L) 11/24/2021   MCV 83.1 11/24/2021   PLT 263 11/24/2021   Lab Results  Component Value Date   NA 138 11/24/2021   K 3.9 11/24/2021   CO2 23 11/24/2021   GLUCOSE 99 11/24/2021   BUN 9 11/24/2021   CREATININE 0.84 11/24/2021   BILITOT 0.3 03/04/2020   ALKPHOS 87 03/04/2020   AST 27 03/04/2020   ALT 84 (H) 04/23/2019   PROT 6.6 03/04/2020   ALBUMIN 4.0 03/04/2020   CALCIUM 8.5 (L) 11/24/2021   ANIONGAP 8 11/24/2021   Lab Results  Component Value Date   CHOL 130 03/04/2020   CHOL 160 09/20/2015   Lab Results  Component Value Date   HDL 73 03/04/2020   HDL 59 09/20/2015   Lab Results  Component Value Date   LDLCALC 43 03/04/2020   LDLCALC 81 09/20/2015   Lab Results  Component Value Date   TRIG 69 03/04/2020   TRIG 100 09/20/2015   Lab Results  Component Value Date   CHOLHDL 1.8 03/04/2020   CHOLHDL 2.7 09/20/2015   Lab Results  Component Value Date   HGBA1C 5.4 04/09/2019   HGBA1C 5.0 01/04/2017       Assessment & Plan:   Problem List Items Addressed This Visit   None   I am having Kathryn Stevenson maintain her multivitamin-iron-minerals-folic acid, hydrochlorothiazide, cetirizine, fluticasone, and amoxicillin-clavulanate.  No orders of the defined types were placed in this encounter.    Teena Dunk, NP

## 2021-11-26 LAB — LIPID PANEL
Chol/HDL Ratio: 1.9 ratio (ref 0.0–4.4)
Cholesterol, Total: 154 mg/dL (ref 100–199)
HDL: 82 mg/dL (ref >39)
LDL Chol Calc (NIH): 58 mg/dL (ref 0–99)
Triglycerides: 72 mg/dL (ref 0–149)
VLDL Cholesterol Cal: 14 mg/dL (ref 5–40)

## 2021-11-26 LAB — HEMOGLOBIN A1C
Est. average glucose Bld gHb Est-mCnc: 103 mg/dL
Hgb A1c MFr Bld: 5.2 % (ref 4.8–5.6)

## 2021-12-12 ENCOUNTER — Other Ambulatory Visit: Payer: Self-pay

## 2021-12-16 ENCOUNTER — Ambulatory Visit (HOSPITAL_COMMUNITY)
Admission: EM | Admit: 2021-12-16 | Discharge: 2021-12-16 | Disposition: A | Discharge status: Home / Self Care | Payer: 59 | Attending: Physician Assistant | Admitting: Physician Assistant

## 2021-12-16 ENCOUNTER — Encounter (HOSPITAL_COMMUNITY): Payer: Self-pay

## 2021-12-16 DIAGNOSIS — L732 Hidradenitis suppurativa: Secondary | ICD-10-CM

## 2021-12-16 DIAGNOSIS — L02412 Cutaneous abscess of left axilla: Secondary | ICD-10-CM

## 2021-12-16 MED ORDER — LIDOCAINE-EPINEPHRINE 1 %-1:100000 IJ SOLN
INTRAMUSCULAR | Status: AC
  Filled 2021-12-16: qty 1

## 2021-12-16 MED ORDER — CHLORHEXIDINE GLUCONATE 4 % EX LIQD: Freq: Every day | CUTANEOUS | 0 refills | Status: DC | PRN

## 2021-12-16 MED ORDER — CLINDAMYCIN HCL 300 MG PO CAPS: 300.0000 mg | ORAL_CAPSULE | Freq: Three times a day (TID) | ORAL | 0 refills | Status: DC

## 2021-12-16 MED ORDER — HYDROCODONE-ACETAMINOPHEN 5-325 MG PO TABS: 1.0000 | ORAL_TABLET | Freq: Two times a day (BID) | ORAL | 0 refills | Status: AC | PRN

## 2021-12-16 NOTE — ED Triage Notes (Addendum)
Pt presents with a boil under her left arm. Pt would like a refill on Hibiclens.

## 2021-12-16 NOTE — ED Provider Notes (Signed)
[facility]    CSN: [id number] Arrival date & time: 12/16/21  1819      History   Chief Complaint Chief Complaint  Patient presents with   Recurrent Skin Infections    HPI Kathryn Stevenson is a 46 y.o. female.   Patient presents today with a 5-day history of enlarging painful lesion in her left axilla.  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  She reports that she was recently treated with doxycycline within the past month for similar symptoms which did not provide any relief of symptoms.  She reports that pain is rated 10 on a 0-10 pain scale, described as throbbing, worse with palpation, no alleviating factors identified.  She denies any fever, nausea, vomiting, chest pain, shortness of breath.  She has not seen a dermatologist in the past and denies formal diagnosis of hidradenitis suppurativa.  She has not seen a surgeon in the past.    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[place])    Morbid obesity ([place])    Pancreatic lesion 06/2018   seen on MR of the liver     Patient Active Problem List   Diagnosis Date Noted   Pancreatic cancer ([place]) 04/15/2019   Primary malignant neoplasm of head of pancreas ([place]) 04/15/2019   Acute hepatitis B    Elevated LFTs    Malaise and fatigue    Liver dysfunction 07/13/2018   Pancreatic lesion 07/13/2018   Anxiety 05/15/2017   Former smoker 05/15/2017   Right leg pain 03/29/2017   RUQ abdominal pain 06/30/2016   Liver lesion 06/30/2016   Gastroesophageal reflux disease 06/30/2016   Morbid obesity ([place]) 09/20/2015   Severe obesity (BMI >= 40) ([place]) 09/20/2015   Dermatitis 09/20/2015    Past Surgical History:  Procedure Laterality Date   ANKLE SURGERY Left    BIOPSY  01/15/2019   Procedure: BIOPSY;   Surgeon: Irving Copas., MD;  Location: [facility];  Service: Gastroenterology;;   ESOPHAGOGASTRODUODENOSCOPY (EGD) WITH PROPOFOL N/A 01/15/2019   Procedure: ESOPHAGOGASTRODUODENOSCOPY (EGD) WITH PROPOFOL;  Surgeon: Irving Copas., MD;  Location: [facility];  Service: Gastroenterology;  Laterality: N/A;   FINE NEEDLE ASPIRATION  01/15/2019   Procedure: FINE NEEDLE ASPIRATION (FNA) LINEAR;  Surgeon: Irving Copas., MD;  Location: [facility];  Service: Gastroenterology;;   INDUCED ABORTION     MOUTH SURGERY     TUBAL LIGATION     UPPER ESOPHAGEAL ENDOSCOPIC ULTRASOUND (EUS) N/A 01/15/2019   Procedure: UPPER ESOPHAGEAL ENDOSCOPIC ULTRASOUND (EUS);  Surgeon: Irving Copas., MD;  Location: [facility];  Service: Gastroenterology;  Laterality: N/A;   WHIPPLE PROCEDURE N/A 04/15/2019   Procedure: WHIPPLE PROCEDURE;  Surgeon: Stark Klein, MD;  Location: [facility];  Service: General;  Laterality: N/A;  EPIDURAL    OB History     Gravida  6   Para  2   Term  2   Preterm      AB  4   Living  2      SAB  1   IAB  3   Ectopic      Multiple      Live Births  Home Medications    Prior to Admission medications   Medication Sig Start Date End Date Taking? Authorizing Provider  clindamycin (CLEOCIN) 300 MG capsule Take 1 capsule (300 mg total) by mouth 3 (three) times daily. 12/16/21  Yes Adron Geisel, Derry Skill, PA-C  HYDROcodone-acetaminophen (NORCO/VICODIN) 5-325 MG tablet Take 1 tablet by mouth 2 (two) times daily as needed for up to 3 days. 12/16/21 12/19/21 Yes Starlett Pehrson K, PA-C  chlorhexidine (HIBICLENS) 4 % external liquid Apply topically daily as needed. 12/16/21   Krisha Beegle K, PA-C  fluticasone (FLONASE) 50 MCG/ACT nasal spray Place 2 sprays into both nostrils daily for 7 days, THEN 1 spray daily. 10/20/21 12/26/21  Talbot Grumbling, FNP  hydrochlorothiazide (HYDRODIURIL) 25 MG tablet Take 1 tablet (25 mg total) by mouth  daily. Patient not taking: Reported on 11/25/2021 09/13/20   Vevelyn Francois, NP  lisinopril (ZESTRIL) 20 MG tablet Take 1 tablet (20 mg total) by mouth daily. 11/25/21   Bo Merino I, NP  multivitamin-iron-minerals-folic acid (CENTRUM) chewable tablet Chew 1 tablet by mouth daily.    [provider]    Family History Family History  Problem Relation Age of Onset   Hypertension Mother    Ovarian cancer Maternal Grandmother    Heart attack Maternal Grandmother    Esophageal cancer Neg Hx    Colon cancer Neg Hx    Pancreatic cancer Neg Hx    Stomach cancer Neg Hx    Liver disease Neg Hx     Social History Social History   Tobacco Use   Smoking status: Former    Packs/day: 0.50    Types: Cigarettes    Quit date: 12/11/2016    Years since quitting: 5.0   Smokeless tobacco: Never  Vaping Use   Vaping Use: Never used  Substance Use Topics   Alcohol use: Yes    Comment: occasionally   Drug use: Yes    Types: Marijuana     Allergies   Patient has no known allergies.   Review of Systems Review of Systems  Constitutional:  Positive for activity change. Negative for appetite change, fatigue and fever.  Respiratory:  Negative for cough and shortness of breath.   Cardiovascular:  Negative for chest pain.  Gastrointestinal:  Negative for abdominal pain, diarrhea, nausea and vomiting.  Skin:  Positive for color change and wound.  Neurological:  Negative for dizziness, light-headedness and headaches.     Physical Exam Triage Vital Signs ED Triage Vitals [12/16/21 1857]  Enc Vitals Group     BP 120/84     Pulse Rate 63     Resp 16     Temp 99 F (37.2 C)     Temp Source Oral     SpO2 99 %     Weight      Height      Head Circumference      Peak Flow      Pain Score      Pain Loc      Pain Edu?      Excl. in [place]?    No data found.  Updated Vital Signs BP 120/84 (BP Location: Left Arm)   Pulse 63   Temp 99 F (37.2 C) (Oral)   Resp 16   SpO2 99%    Visual Acuity Right Eye Distance:   Left Eye Distance:   Bilateral Distance:    Right Eye Near:   Left Eye Near:    Bilateral Near:  Physical Exam Vitals reviewed.  Constitutional:      General: She is awake. She is not in acute distress.    Appearance: Normal appearance. She is well-developed. She is not ill-appearing.     Comments: Very pleasant female appears stated age in no acute distress sitting comfortably in exam room  HENT:     Head: Normocephalic and atraumatic.  Cardiovascular:     Rate and Rhythm: Normal rate and regular rhythm.     Heart sounds: Normal heart sounds, S1 normal and S2 normal. No murmur heard. Pulmonary:     Effort: Pulmonary effort is normal.     Breath sounds: Normal breath sounds. No wheezing, rhonchi or rales.     Comments: Clear to auscultation bilaterally Abdominal:     Palpations: Abdomen is soft.     Tenderness: There is no abdominal tenderness.  Skin:    Findings: Abscess present.     Comments: 3 cm x 4 cm abscess with mild central fluctuance and surrounding erythema.  No streaking or evidence of lymphangitis.  No active bleeding or drainage noted.  Psychiatric:        Behavior: Behavior is cooperative.      UC Treatments / Results  Labs (all labs ordered are listed, but only abnormal results are displayed) Labs Reviewed - No data to display  EKG   Radiology No results found.  Procedures Incision and Drainage  Date/Time: 12/16/2021 7:35 PM  Performed by: Terrilee Croak, PA-C Authorized by: Terrilee Croak, PA-C   Consent:    Consent obtained:  Verbal   Consent given by:  Patient   Risks discussed:  Incomplete drainage, infection, pain and damage to other organs   Alternatives discussed:  Observation and referral Universal protocol:    Procedure explained and questions answered to patient or proxy's satisfaction: yes     Patient identity confirmed:  Verbally with patient Location:    Type:  Abscess   Size:  3 cm x  4 cm   Location:  Upper extremity   Upper extremity location:  Arm Pre-procedure details:    Skin preparation:  Chlorhexidine with alcohol Sedation:    Sedation type:  None Anesthesia:    Anesthesia method:  Local infiltration   Local anesthetic:  Lidocaine 1% WITH epi Procedure type:    Complexity:  Simple Procedure details:    Ultrasound guidance: no     Needle aspiration: no     Incision types:  Stab incision   Drainage:  Bloody and purulent   Drainage amount:  Scant   Wound treatment:  Wound left open   Packing materials:  None Post-procedure details:    Procedure completion:  Procedure terminated electively by provider  (including critical care time)  Medications Ordered in UC Medications - No data to display  Initial Impression / Assessment and Plan / UC Course  I have reviewed the triage vital signs and the nursing notes.  Pertinent labs & imaging results that were available during my care of the patient were reviewed by me and considered in my medical decision making (see chart for details).     Attempted I&D in clinic, however, only scant purulent drainage was expressed from wound and patient had additional areas of drainage in axilla with pressure indicating drainage and significant tracking.  Procedure was stopped since we do not have imaging capabilities.  We will start clindamycin 3 times daily.  Discussed that this can upset her stomach so she should take it with food.  If she develops any significant diarrhea she is to stop the medication to be seen immediately.  Recommended warm compresses and keeping area clean.  She was given hydrocodone several doses for pain relief.  Review of [place] controlled substance database shows no inappropriate refill.  Discussed that she should follow-up with the surgeon first thing next week for further evaluation and management.  Also discussed that symptoms are likely related to hidradenitis suppurativa.  She should follow-up  with dermatology she may need immunosuppressive medication.  She was given contact information for local provider with instruction to call to schedule an appointment.  Discussed that if she has any increased pain, swelling, fever, nausea, vomiting she needs to go immediately to the emergency room for further evaluation and management as a may be able to drain this under image guidance.  Discussed at length alarm symptoms that warrant emergent evaluation.  Should return precautions given.  Work excuse note provided.  Final Clinical Impressions(s) / UC Diagnoses   Final diagnoses:  Abscess of left axilla     Discharge Instructions      I am concerned that you have a condition called hidradenitis suppurativa causing all of these abscesses.  Unfortunately we are unable to drain this abscess in clinic today.  Please use warm compresses and keep the area clean.  I have refilled your Hibiclens.  Start clindamycin 3 times daily.  You can use hydrocodone for pain relief.  Do not drive or drink alcohol with taking this medication.  You should follow-up with the surgeon first thing next week for reevaluation.  I also recommend that you establish with a dermatologist that she may need ongoing management of this condition.  Please call to schedule an appointment.  If you have any worsening symptoms you need to go to the emergency room immediately.     ED Prescriptions     Medication Sig Dispense Auth. Provider   chlorhexidine (HIBICLENS) 4 % external liquid Apply topically daily as needed. 473 mL Thalia Turkington K, PA-C   clindamycin (CLEOCIN) 300 MG capsule Take 1 capsule (300 mg total) by mouth 3 (three) times daily. 30 capsule Syrah Daughtrey K, PA-C   HYDROcodone-acetaminophen (NORCO/VICODIN) 5-325 MG tablet Take 1 tablet by mouth 2 (two) times daily as needed for up to 3 days. 6 tablet Booker Bhatnagar K, PA-C      I have reviewed the PDMP during this encounter.   Terrilee Croak, PA-C 12/16/21 2022

## 2021-12-16 NOTE — Discharge Instructions (Signed)
I am concerned that you have a condition called hidradenitis suppurativa causing all of these abscesses.  Unfortunately we are unable to drain this abscess in clinic today.  Please use warm compresses and keep the area clean.  I have refilled your Hibiclens.  Start clindamycin 3 times daily.  You can use hydrocodone for pain relief.  Do not drive or drink alcohol with taking this medication.  You should follow-up with the surgeon first thing next week for reevaluation.  I also recommend that you establish with a dermatologist that she may need ongoing management of this condition.  Please call to schedule an appointment.  If you have any worsening symptoms you need to go to the emergency room immediately.

## 2022-02-27 ENCOUNTER — Ambulatory Visit: Payer: 59 | Admitting: Nurse Practitioner

## 2022-04-03 ENCOUNTER — Emergency Department (HOSPITAL_COMMUNITY)
Admission: EM | Admit: 2022-04-03 | Discharge: 2022-04-03 | Disposition: A | Discharge status: Home / Self Care | Payer: 59 | Attending: Emergency Medicine | Admitting: Emergency Medicine

## 2022-04-03 ENCOUNTER — Encounter (HOSPITAL_COMMUNITY): Payer: Self-pay

## 2022-04-03 ENCOUNTER — Other Ambulatory Visit: Payer: Self-pay

## 2022-04-03 DIAGNOSIS — Y92410 Unspecified street and highway as the place of occurrence of the external cause: Secondary | ICD-10-CM | POA: Diagnosis not present

## 2022-04-03 DIAGNOSIS — Z79899 Other long term (current) drug therapy: Secondary | ICD-10-CM | POA: Insufficient documentation

## 2022-04-03 DIAGNOSIS — R220 Localized swelling, mass and lump, head: Secondary | ICD-10-CM | POA: Insufficient documentation

## 2022-04-03 DIAGNOSIS — H02846 Edema of left eye, unspecified eyelid: Secondary | ICD-10-CM | POA: Insufficient documentation

## 2022-04-03 MED ORDER — TETRACAINE HCL 0.5 % OP SOLN
2.0000 [drp] | Freq: Once | OPHTHALMIC | Status: AC
  Administered 2022-04-03: 2 [drp] via OPHTHALMIC
  Filled 2022-04-03: qty 4

## 2022-04-03 MED ORDER — FLUORESCEIN SODIUM 1 MG OP STRP
1.0000 | ORAL_STRIP | Freq: Once | OPHTHALMIC | Status: AC
  Administered 2022-04-03: 1 via OPHTHALMIC
  Filled 2022-04-03: qty 1
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
## 2022-04-03 NOTE — Discharge Instructions (Signed)
Note the work-up today was overall reassuring.  As discussed, take ibuprofen/Aleve as needed for pain/inflammation.  Continue to ice the area for the next couple of days.  You can then switch to heat to help resorb bruising noted on your left eye.  Please do not hesitate to return to emergency department the worrisome signs symptoms we discussed become apparent.

## 2022-10-09 ENCOUNTER — Other Ambulatory Visit: Payer: Self-pay | Admitting: Internal Medicine

## 2022-10-09 DIAGNOSIS — Z1231 Encounter for screening mammogram for malignant neoplasm of breast: Secondary | ICD-10-CM

## 2022-10-24 IMAGING — MG MM DIGITAL SCREENING BILAT W/ TOMO AND CAD
8 series · 8 of 24 positions shown · non-contrast
Comparison: Previous exam(s).

CLINICAL DATA: Screening.

EXAM:
DIGITAL SCREENING BILATERAL MAMMOGRAM WITH TOMOSYNTHESIS AND CAD
TECHNIQUE: Bilateral screening digital craniocaudal and mediolateral oblique
mammograms were obtained. Bilateral screening digital breast
tomosynthesis was performed. The images were evaluated with
computer-aided detection.

[L CC synth-2D]
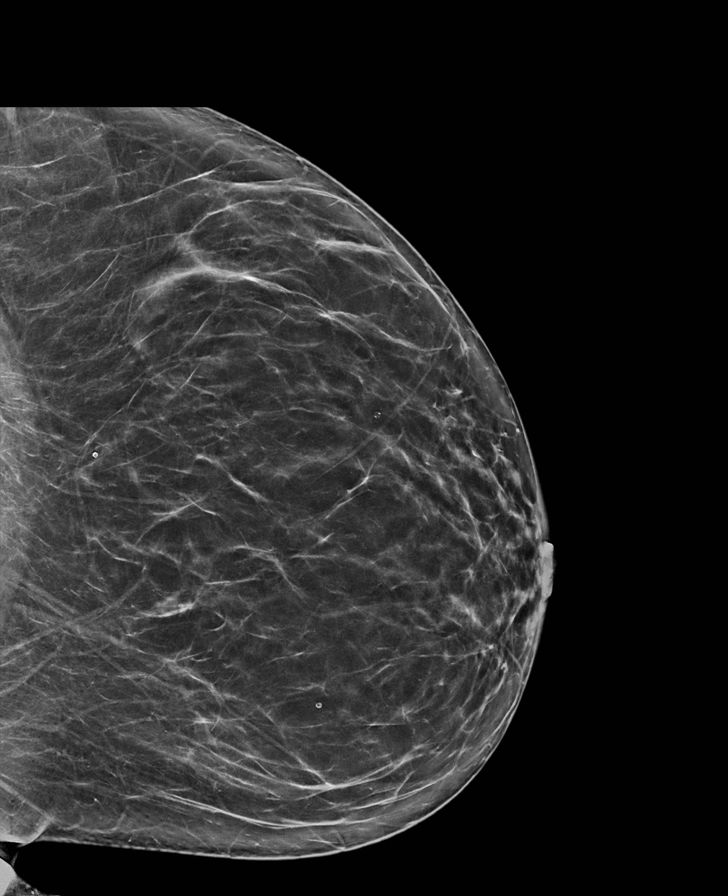

[L MLO synth-2D]
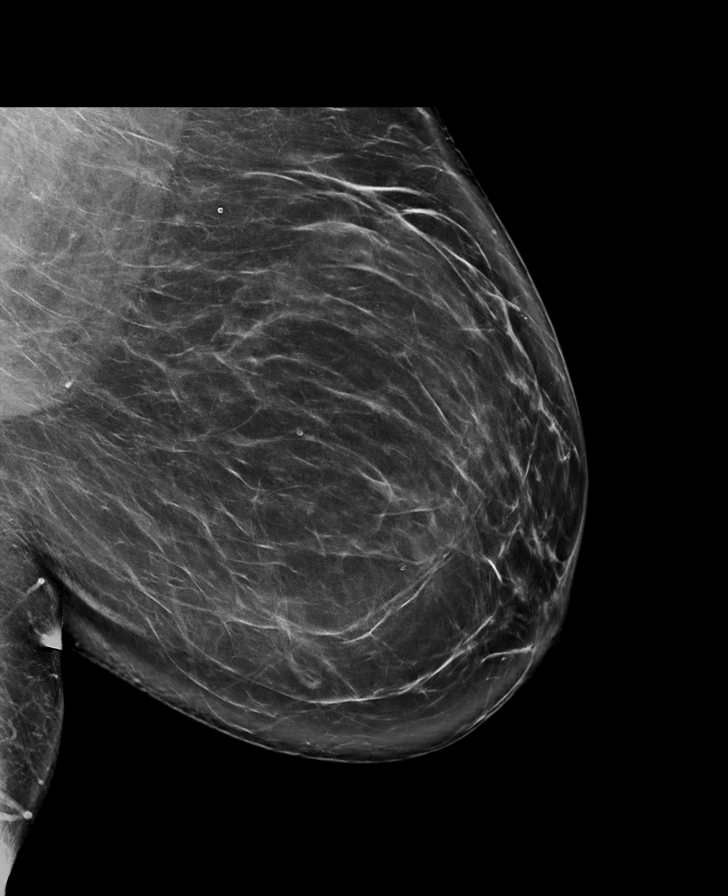

[R MLO synth-2D]
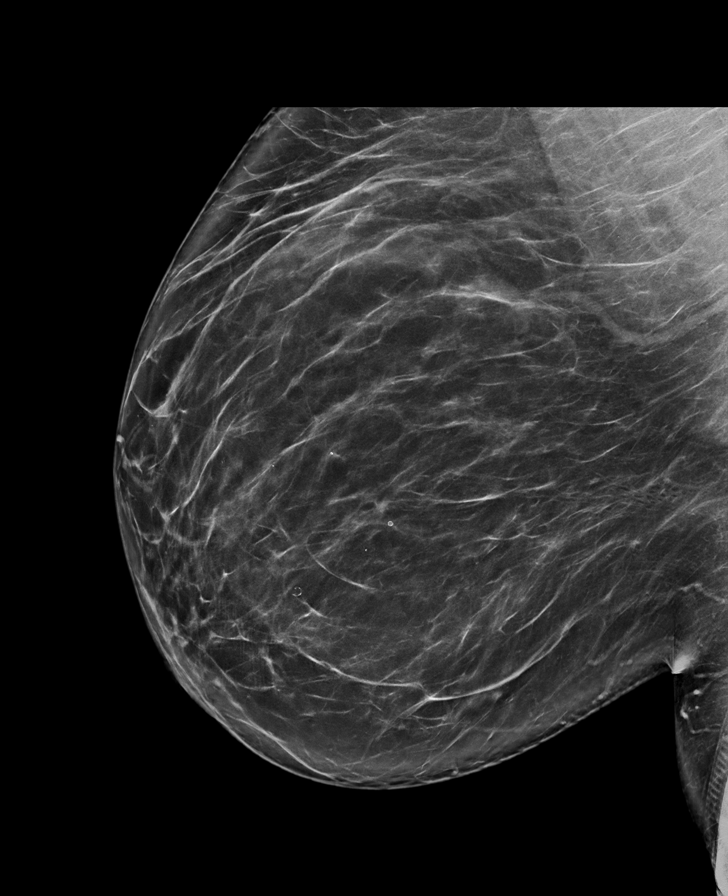

[R CC synth-2D]
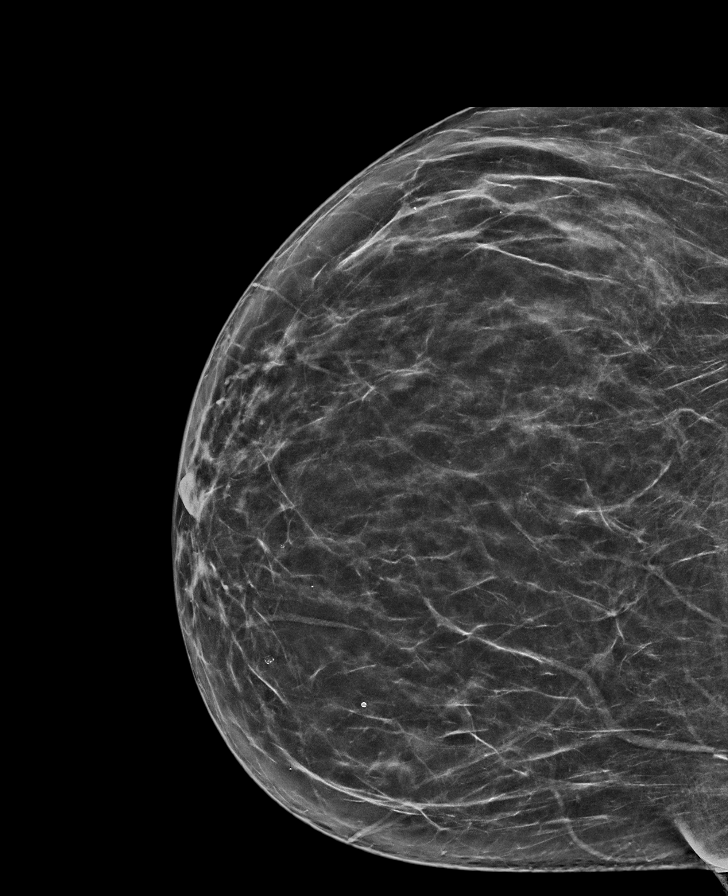

[L MLO tomo · tomo slice 51/102.0]
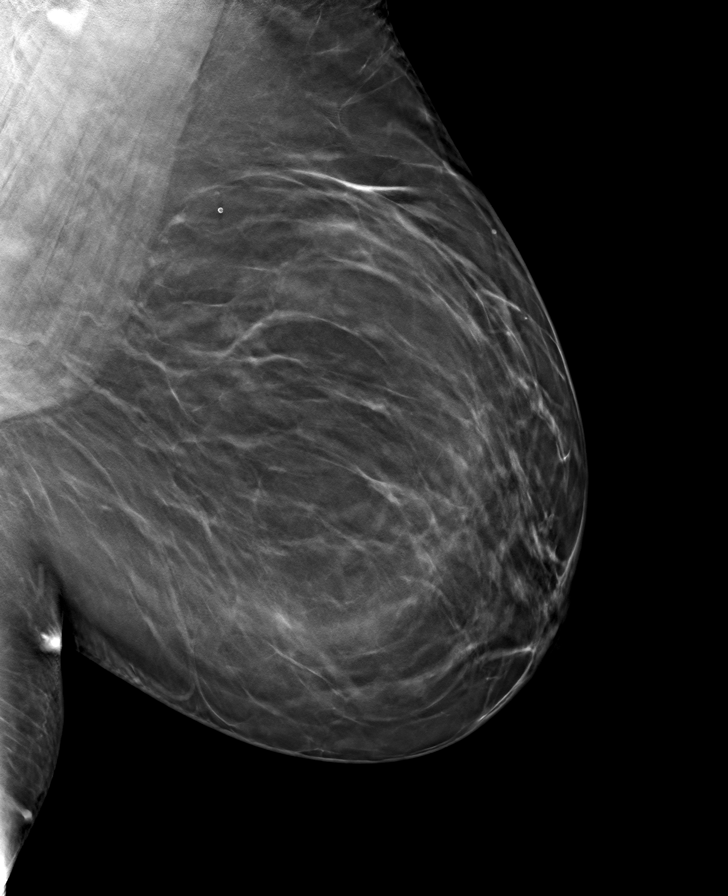

[L CC tomo · tomo slice 44/87.0]
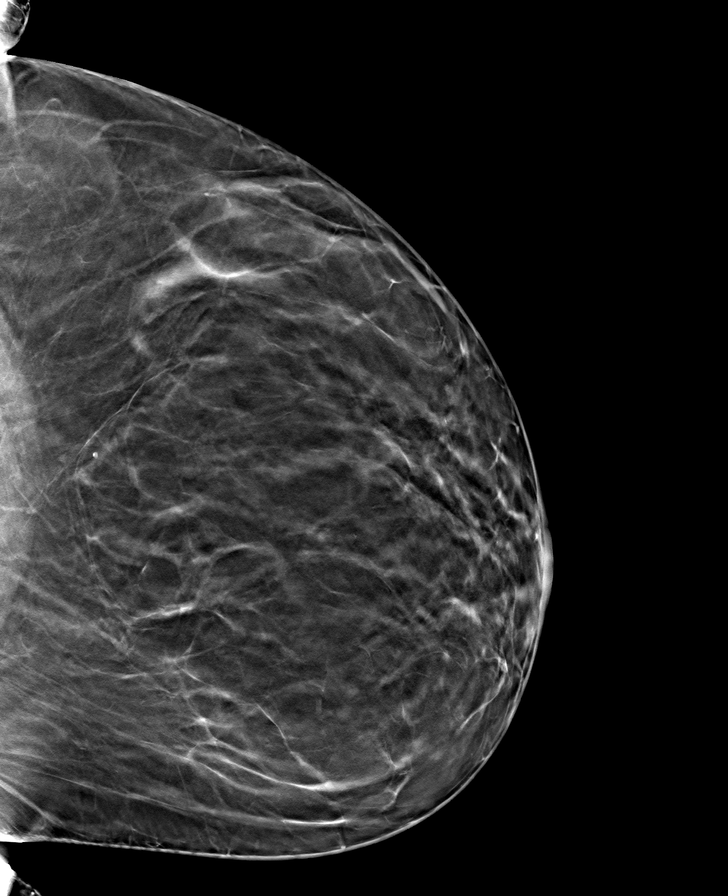

[R CC tomo · tomo slice 43/84.0]
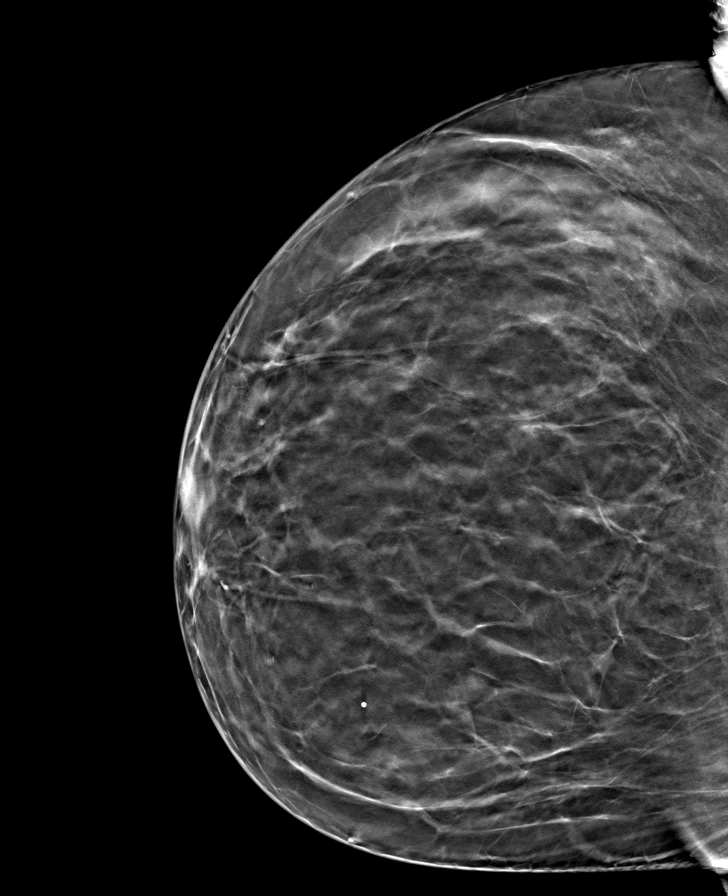

[R MLO tomo · tomo slice 51/102.0]
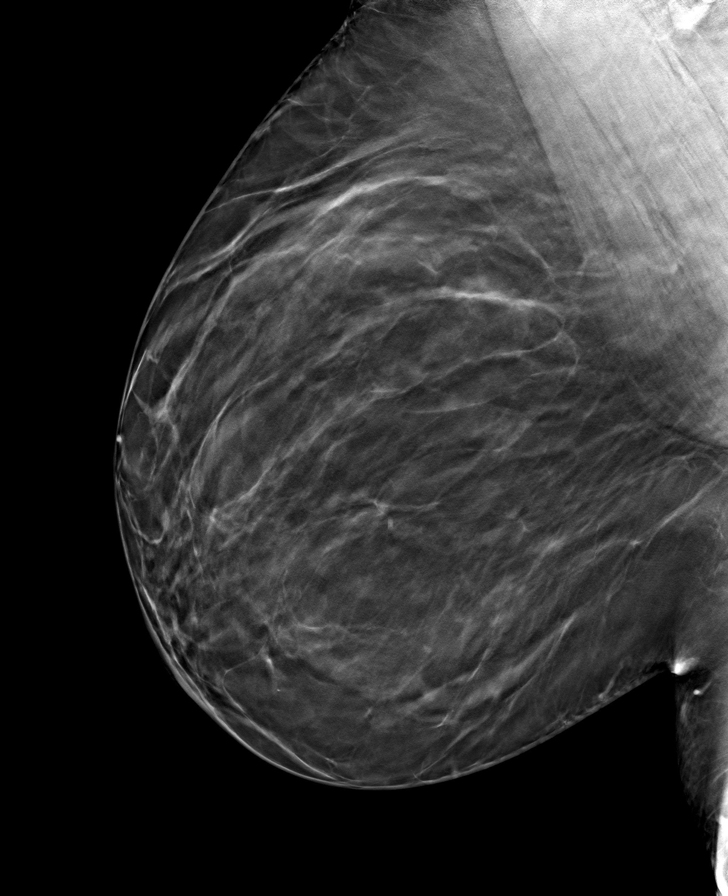

[8 of 24 positions shown; findings below may reference images not displayed]

ACR Breast Density Category b: There are scattered areas of
fibroglandular density.
FINDINGS: There are no findings suspicious for malignancy.
IMPRESSION: No mammographic evidence of malignancy. A result letter of this
screening mammogram will be mailed directly to the patient.

RECOMMENDATION:
Screening mammogram in one year. (Code:51-O-LD2)

BI-RADS CATEGORY  1: Negative.

## 2022-11-14 ENCOUNTER — Ambulatory Visit: Payer: 59

## 2022-11-29 ENCOUNTER — Ambulatory Visit
Admission: RE | Admit: 2022-11-29 | Discharge: 2022-11-29 | Disposition: A | Discharge status: Home / Self Care | Payer: Medicaid Other | Source: Ambulatory Visit | Attending: Internal Medicine | Admitting: Internal Medicine

## 2022-11-29 DIAGNOSIS — Z1231 Encounter for screening mammogram for malignant neoplasm of breast: Secondary | ICD-10-CM

## 2022-12-13 ENCOUNTER — Encounter: Payer: Self-pay | Admitting: Internal Medicine

## 2022-12-13 DIAGNOSIS — M79621 Pain in right upper arm: Secondary | ICD-10-CM

## 2022-12-13 DIAGNOSIS — R2231 Localized swelling, mass and lump, right upper limb: Secondary | ICD-10-CM

## 2022-12-18 ENCOUNTER — Encounter: Payer: Self-pay | Admitting: Internal Medicine

## 2022-12-18 ENCOUNTER — Other Ambulatory Visit: Payer: Self-pay | Admitting: Internal Medicine

## 2022-12-18 DIAGNOSIS — R2231 Localized swelling, mass and lump, right upper limb: Secondary | ICD-10-CM

## 2022-12-18 DIAGNOSIS — M79621 Pain in right upper arm: Secondary | ICD-10-CM

## 2022-12-22 ENCOUNTER — Other Ambulatory Visit: Payer: Self-pay | Admitting: Internal Medicine

## 2022-12-22 ENCOUNTER — Ambulatory Visit
Admission: RE | Admit: 2022-12-22 | Discharge: 2022-12-22 | Disposition: A | Discharge status: Home / Self Care | Payer: Medicaid Other | Source: Ambulatory Visit | Attending: Internal Medicine | Admitting: Internal Medicine

## 2022-12-22 DIAGNOSIS — M79621 Pain in right upper arm: Secondary | ICD-10-CM

## 2022-12-22 DIAGNOSIS — R2231 Localized swelling, mass and lump, right upper limb: Secondary | ICD-10-CM

## 2022-12-25 ENCOUNTER — Ambulatory Visit
Admission: RE | Admit: 2022-12-25 | Discharge: 2022-12-25 | Disposition: A | Discharge status: Home / Self Care | Payer: Medicaid Other | Source: Ambulatory Visit | Attending: Internal Medicine | Admitting: Internal Medicine

## 2022-12-25 ENCOUNTER — Other Ambulatory Visit: Payer: Self-pay | Admitting: Internal Medicine

## 2022-12-25 DIAGNOSIS — M79621 Pain in right upper arm: Secondary | ICD-10-CM

## 2022-12-25 DIAGNOSIS — R2231 Localized swelling, mass and lump, right upper limb: Secondary | ICD-10-CM

## 2022-12-25 DIAGNOSIS — N611 Abscess of the breast and nipple: Secondary | ICD-10-CM

## 2023-01-02 ENCOUNTER — Other Ambulatory Visit: Payer: Medicaid Other

## 2023-04-09 ENCOUNTER — Encounter: Payer: Self-pay | Admitting: Nurse Practitioner

## 2023-04-09 ENCOUNTER — Ambulatory Visit (INDEPENDENT_AMBULATORY_CARE_PROVIDER_SITE_OTHER): Payer: BC Managed Care – PPO | Admitting: Nurse Practitioner

## 2023-04-09 ENCOUNTER — Other Ambulatory Visit (HOSPITAL_COMMUNITY)
Admission: RE | Admit: 2023-04-09 | Discharge: 2023-04-09 | Disposition: A | Discharge status: Home / Self Care | Payer: BC Managed Care – PPO | Source: Ambulatory Visit | Attending: Nurse Practitioner | Admitting: Nurse Practitioner

## 2023-04-09 VITALS — BP 119/80 | HR 69 | Ht 66.0 in | Wt 309.0 lb

## 2023-04-09 DIAGNOSIS — Z124 Encounter for screening for malignant neoplasm of cervix: Secondary | ICD-10-CM | POA: Diagnosis not present

## 2023-04-09 NOTE — Progress Notes (Signed)
Subjective   Patient ID: Kathryn Stevenson, female    DOB: 1975-06-24, 47 y.o.   MRN: [id number]  Chief Complaint  Patient presents with   Gynecologic Exam    Referring provider: Collene Mares, [place] D Plagge is a 47 y.o. female with Past Medical History: No date: Acid reflux No date: Dermatitis No date: Fracture, ankle No date: GERD (gastroesophageal reflux disease) No date: Hepatic hemangioma No date: Hepatitis B No date: Malignant neoplasm of head of pancreas (HCC) No date: Morbid obesity (HCC) 06/2018: Pancreatic lesion     Comment:  seen on MR of the liver    HPI  Patient presents today for Pap smear.  Overall she has been doing well.  She would like to try [facility] for weight loss.  She is seeing bariatric weight loss specialist.  She states that she does continue to gain weight though.  Patient is not diabetic.  We will check with pharmacy to see if this medication can be improved. Denies f/c/s, n/v/d, hemoptysis, PND, leg swelling Denies chest pain or edema     No Known Allergies  Immunization History  Administered Date(s) Administered   Moderna Sars-Covid-2 Vaccination 09/11/2019, 10/14/2019   Pneumococcal Polysaccharide-23 09/20/2015   Tdap 09/20/2015    Tobacco History: Social History   Tobacco Use  Smoking Status Former   Current packs/day: 0.00   Types: Cigarettes   Quit date: 12/11/2016   Years since quitting: 6.3  Smokeless Tobacco Never   Counseling given: Not Answered   Outpatient Encounter Medications as of 04/09/2023  Medication Sig   [DISCONTINUED] chlorhexidine (HIBICLENS) 4 % external liquid Apply topically daily as needed.   [DISCONTINUED] clindamycin (CLEOCIN) 300 MG capsule Take 1 capsule (300 mg total) by mouth 3 (three) times daily.   [DISCONTINUED] hydrochlorothiazide (HYDRODIURIL) 25 MG tablet Take 1 tablet (25 mg total) by mouth daily.   [DISCONTINUED] lisinopril (ZESTRIL) 20 MG tablet Take 1 tablet (20 mg total) by  mouth daily.   [DISCONTINUED] multivitamin-iron-minerals-folic acid (CENTRUM) chewable tablet Chew 1 tablet by mouth daily.   [DISCONTINUED] fluticasone (FLONASE) 50 MCG/ACT nasal spray Place 2 sprays into both nostrils daily for 7 days, THEN 1 spray daily.   No facility-administered encounter medications on file as of 04/09/2023.    Review of Systems  Review of Systems  Constitutional: Negative.   HENT: Negative.    Cardiovascular: Negative.   Gastrointestinal: Negative.   Allergic/Immunologic: Negative.   Neurological: Negative.   Psychiatric/Behavioral: Negative.       Objective:   BP 119/80 (BP Location: Right Arm, Patient Position: Sitting, Cuff Size: Large)   Pulse 69   Ht 5\' 6"  (1.676 m)   Wt (!) 309 lb (140.2 kg)   SpO2 98%   BMI 49.87 kg/m   Wt Readings from Last 5 Encounters:  04/09/23 (!) 309 lb (140.2 kg)  04/03/22 280 lb (127 kg)  11/25/21 288 lb 3.2 oz (130.7 kg)  01/14/21 278 lb 0.6 oz (126.1 kg)  12/15/20 267 lb 0.4 oz (121.1 kg)     Physical Exam Vitals and nursing note reviewed. Exam conducted with a chaperone present.  Constitutional:      General: She is not in acute distress.    Appearance: She is well-developed.  Cardiovascular:     Rate and Rhythm: Normal rate and regular rhythm.  Pulmonary:     Effort: Pulmonary effort is normal.     Breath sounds: Normal breath sounds.  Genitourinary:    General: Normal  vulva.     Vagina: Normal.     Cervix: Normal.     Uterus: Normal.      Adnexa: Right adnexa normal and left adnexa normal.  Neurological:     Mental Status: She is alert and oriented to person, place, and time.       Assessment & Plan:   Cervical cancer screening -     Cytology - PAP     Return in about 3 months (around 07/10/2023) for check on BP.   Ivonne Andrew, NP 04/09/2023

## 2023-04-09 NOTE — Patient Instructions (Signed)
1. Cervical cancer screening  - Cytology - PAP([place])   Follow up:  Follow up in 1 year

## 2023-04-18 LAB — CYTOLOGY - PAP
Adequacy: ABSENT
Comment: NEGATIVE
Diagnosis: NEGATIVE
High risk HPV: NEGATIVE

## 2023-04-20 ENCOUNTER — Other Ambulatory Visit: Payer: Self-pay | Admitting: Nurse Practitioner

## 2023-04-20 DIAGNOSIS — E66813 Obesity, class 3: Secondary | ICD-10-CM

## 2023-07-02 ENCOUNTER — Ambulatory Visit (INDEPENDENT_AMBULATORY_CARE_PROVIDER_SITE_OTHER): Payer: BC Managed Care – PPO | Admitting: Nurse Practitioner

## 2023-07-02 ENCOUNTER — Encounter: Payer: Self-pay | Admitting: Nurse Practitioner

## 2023-07-02 VITALS — BP 135/91 | HR 52 | Temp 98.2°F | Wt 311.0 lb

## 2023-07-02 DIAGNOSIS — J069 Acute upper respiratory infection, unspecified: Secondary | ICD-10-CM | POA: Diagnosis not present

## 2023-07-02 LAB — POC COVID19/FLU A&B COMBO
Covid Antigen, POC: NEGATIVE
Influenza A Antigen, POC: NEGATIVE
Influenza B Antigen, POC: NEGATIVE

## 2023-07-02 LAB — POCT RAPID STREP A (OFFICE): Rapid Strep A Screen: NEGATIVE

## 2023-07-02 MED ORDER — AMOXICILLIN-POT CLAVULANATE 875-125 MG PO TABS: 1.0000 | ORAL_TABLET | Freq: Two times a day (BID) | ORAL | 0 refills | Status: DC

## 2023-07-02 MED ORDER — PREDNISONE 20 MG PO TABS: 20.0000 mg | ORAL_TABLET | Freq: Every day | ORAL | 0 refills | Status: AC

## 2023-07-02 NOTE — Patient Instructions (Signed)
 1. Upper respiratory tract infection, unspecified type (Primary)  - predniSONE  (DELTASONE ) 20 MG tablet; Take 1 tablet (20 mg total) by mouth daily with breakfast for 5 days.  Dispense: 5 tablet; Refill: 0 - amoxicillin -clavulanate (AUGMENTIN ) 875-125 MG tablet; Take 1 tablet by mouth 2 (two) times daily.  Dispense: 20 tablet; Refill: 0   Follow up:  Follow up in 3 months

## 2023-07-02 NOTE — Progress Notes (Signed)
 Subjective   Patient ID: Kathryn Stevenson, female    DOB: [birth year], 48 y.o.   MRN: [id number]  Chief Complaint  Patient presents with   Cough   Sore Throat    fever    Referring provider: Cleotilde Kelle BRAVO, PA  Kathryn Stevenson is a 48 y.o. female with Past Medical History: No date: Acid reflux No date: Dermatitis No date: Fracture, ankle No date: GERD (gastroesophageal reflux disease) No date: Hepatic hemangioma No date: Hepatitis B No date: Malignant neoplasm of head of pancreas (HCC) No date: Morbid obesity (HCC) 06/2018: Pancreatic lesion     Comment:  seen on MR of the liver    HPI  Patient presents today for an acute visit.  She states that she developed sore throat and a cough over this past weekend.  Symptoms are progressively worsening.  She did feel like she had a fever over the weekend.  She does not have a fever in office today.  We did test her for strep flu and COVID today which were all negative. Denies f/c/s, n/v/d, hemoptysis, PND, leg swelling Denies chest pain or edema     No Known Allergies  Immunization History  Administered Date(s) Administered   Moderna Sars-Covid-2 Vaccination 09/11/2019, 10/14/2019   Pneumococcal Polysaccharide-23 09/20/2015   Tdap 09/20/2015    Tobacco History: Social History   Tobacco Use  Smoking Status Former   Current packs/day: 0.00   Types: Cigarettes   Quit date: 12/11/2016   Years since quitting: 6.5  Smokeless Tobacco Never   Counseling given: Not Answered   Outpatient Encounter Medications as of 07/02/2023  Medication Sig   amoxicillin -clavulanate (AUGMENTIN ) 875-125 MG tablet Take 1 tablet by mouth 2 (two) times daily.   predniSONE  (DELTASONE ) 20 MG tablet Take 1 tablet (20 mg total) by mouth daily with breakfast for 5 days.   No facility-administered encounter medications on file as of 07/02/2023.    Review of Systems  Review of Systems  Constitutional: Negative.   HENT: Negative.     Cardiovascular: Negative.   Gastrointestinal: Negative.   Allergic/Immunologic: Negative.   Neurological: Negative.   Psychiatric/Behavioral: Negative.       Objective:   BP (!) 135/91   Pulse (!) 52   Temp 98.2 F (36.8 C)   Wt (!) 311 lb (141.1 kg)   SpO2 100%   BMI 50.20 kg/m   Wt Readings from Last 5 Encounters:  07/02/23 (!) 311 lb (141.1 kg)  04/09/23 (!) 309 lb (140.2 kg)  04/03/22 280 lb (127 kg)  11/25/21 288 lb 3.2 oz (130.7 kg)  01/14/21 278 lb 0.6 oz (126.1 kg)     Physical Exam Vitals and nursing note reviewed.  Constitutional:      General: She is not in acute distress.    Appearance: She is well-developed.  Cardiovascular:     Rate and Rhythm: Normal rate and regular rhythm.  Pulmonary:     Effort: Pulmonary effort is normal.     Breath sounds: Normal breath sounds.  Neurological:     Mental Status: She is alert and oriented to person, place, and time.       Assessment & Plan:   Upper respiratory tract infection, unspecified type -     predniSONE ; Take 1 tablet (20 mg total) by mouth daily with breakfast for 5 days.  Dispense: 5 tablet; Refill: 0 -     Amoxicillin -Pot Clavulanate; Take 1 tablet by mouth 2 (two) times daily.  Dispense: 20  tablet; Refill: 0 -     POCT rapid strep A -     POC Covid19/Flu A&B Antigen     Return if symptoms worsen or fail to improve.   Bascom GORMAN Borer, NP 07/02/2023

## 2023-07-09 ENCOUNTER — Ambulatory Visit: Payer: Self-pay | Admitting: Nurse Practitioner

## 2023-07-19 ENCOUNTER — Encounter (INDEPENDENT_AMBULATORY_CARE_PROVIDER_SITE_OTHER): Payer: Self-pay

## 2023-07-30 ENCOUNTER — Ambulatory Visit (INDEPENDENT_AMBULATORY_CARE_PROVIDER_SITE_OTHER): Payer: BC Managed Care – PPO | Admitting: Family Medicine

## 2023-07-30 ENCOUNTER — Encounter (INDEPENDENT_AMBULATORY_CARE_PROVIDER_SITE_OTHER): Payer: Self-pay | Admitting: Family Medicine

## 2023-07-30 VITALS — BP 127/81 | HR 55 | Temp 98.2°F | Ht 65.0 in | Wt 308.0 lb

## 2023-07-30 DIAGNOSIS — Z6841 Body Mass Index (BMI) 40.0 and over, adult: Secondary | ICD-10-CM

## 2023-07-30 DIAGNOSIS — E669 Obesity, unspecified: Secondary | ICD-10-CM | POA: Diagnosis not present

## 2023-07-30 DIAGNOSIS — R7303 Prediabetes: Secondary | ICD-10-CM

## 2023-07-30 DIAGNOSIS — E66813 Obesity, class 3: Secondary | ICD-10-CM

## 2023-07-30 NOTE — Progress Notes (Signed)
 .smr  Office: [phone]  /  Fax: [phone]  WEIGHT SUMMARY AND BIOMETRICS  Anthropometric Measurements Height: 5\' 5"  (1.651 m) Weight: (!) 308 lb (139.7 kg) BMI (Calculated): 51.25 Weight at Last Visit: N/A Weight Lost Since Last Visit: N/A Weight Gained Since Last Visit: N/A Starting Weight: N/A Peak Weight: N/A   Body Composition  Body Fat %: 55 % Fat Mass (lbs): 169.6 lbs Muscle Mass (lbs): 131.6 lbs Total Body Water  (lbs): 100.4 lbs Visceral Fat Rating : 20   Other Clinical Data Fasting: No Labs: No Today's Visit #: Information Session    Chief Complaint: OBESITY   Discussed the use of AI scribe software for clinical note transcription with the patient, who gave verbal consent to proceed.  History of Present Illness   Kathryn Stevenson is a 48 year old female with obesity and pancreatic cancer who presents to discuss obesity management. She was referred by her primary care provider for obesity management.  She is here to discuss her diagnosis of obesity and review various treatment options. Her current weight is 308 pounds, height is 5 foot 5 inches, and BMI is 51.3. She has a visceral fat rating of 20 and a body fat percentage of 55%.  She has a history of pancreatic cancer diagnosed five years ago, for which she underwent a Whipple procedure. She experienced significant weight loss following the surgery, initially dropping from 356 pounds to 277 pounds a year ago. However, her weight is slowly starting to increase again.  She describes challenges with maintaining focus on weight management and attributes some of her weight gain to stress eating, comfort eating, and cravings, particularly for sweets. She is concerned about these habits, especially given her history of pancreatic cancer and the desire to avoid developing diabetes or hypertension.  She has a history of prediabetes, which she acknowledges would improve with diet, exercise, and weight loss. She is  not currently on any specific medications for weight management but has previously inquired about weight loss surgery, which was deemed not advisable due to her prior Whipple procedure.          PHYSICAL EXAM:  Blood pressure 127/81, pulse (!) 55, temperature 98.2 F (36.8 C), height 5\' 5"  (1.651 m), weight (!) 308 lb (139.7 kg), SpO2 100%. Body mass index is 51.25 kg/m.  DIAGNOSTIC DATA REVIEWED:  BMET    Component Value Date/Time   NA 138 11/24/2021 0728   NA 136 03/04/2020 0925   K 3.9 11/24/2021 0728   CL 107 11/24/2021 0728   CO2 23 11/24/2021 0728   GLUCOSE 99 11/24/2021 0728   BUN 9 11/24/2021 0728   BUN 8 03/04/2020 0925   CREATININE 0.84 11/24/2021 0728   CREATININE 1.09 10/27/2016 1541   CALCIUM 8.5 (L) 11/24/2021 0728   GFRNONAA >60 11/24/2021 0728   GFRNONAA 63 10/27/2016 1541   GFRAA 93 03/04/2020 0925   GFRAA 72 10/27/2016 1541   Lab Results  Component Value Date   HGBA1C 5.2 11/25/2021   HGBA1C 5.0 01/04/2017   No results found for: "INSULIN" Lab Results  Component Value Date   TSH 1.840 03/04/2020   CBC    Component Value Date/Time   WBC 5.8 11/24/2021 0728   RBC 4.13 11/24/2021 0728   HGB 12.5 11/24/2021 0728   HGB 12.9 03/04/2020 0925   HCT 34.3 (L) 11/24/2021 0728   HCT 37.7 03/04/2020 0925   PLT 263 11/24/2021 0728   PLT 332 03/04/2020 0925   MCV 83.1 11/24/2021  0728   MCV 88 03/04/2020 0925   MCH 30.3 11/24/2021 0728   MCHC 36.4 (H) 11/24/2021 0728   RDW 14.9 11/24/2021 0728   RDW 14.0 03/04/2020 0925   Iron Studies    Component Value Date/Time   IRON 123 07/17/2018 0159   TIBC 315 07/17/2018 0159   FERRITIN 1,214 (H) 07/17/2018 0159   IRONPCTSAT 39 (H) 07/17/2018 0159   Lipid Panel     Component Value Date/Time   CHOL 154 11/25/2021 1507   TRIG 72 11/25/2021 1507   HDL 82 11/25/2021 1507   CHOLHDL 1.9 11/25/2021 1507   CHOLHDL 2.7 09/20/2015 0951   VLDL 20 09/20/2015 0951   LDLCALC 58 11/25/2021 1507   Hepatic  Function Panel     Component Value Date/Time   PROT 6.6 03/04/2020 0925   ALBUMIN  4.0 03/04/2020 0925   AST 27 03/04/2020 0925   ALT 84 (H) 04/23/2019 0215   ALKPHOS 87 03/04/2020 0925   BILITOT 0.3 03/04/2020 0925   BILIDIR 1.8 (H) 08/08/2018 1144   IBILI 3.3 (H) 07/18/2018 0344      Component Value Date/Time   TSH 1.840 03/04/2020 0925   Nutritional Lab Results  Component Value Date   VD25OH 33.9 03/04/2020   VD25OH 27.3 (L) 08/06/2019     Assessment and Plan    Obesity Obesity with a BMI of 51.3. Significant weight regain post-Whipple procedure for pancreatic cancer. Challenges include stress eating, comfort eating, and cravings for sweets. Weight loss surgery not recommended due to previous Whipple procedure and scar tissue. Discussed multifactorial nature of obesity, genetic predisposition, and body's resistance to weight loss. Emphasized adequate nutrition to avoid triggering defense mechanisms against weight loss. Discussed dietary plans, medications for emotional eating, and personalized approach. Highlighted that most patients do not undergo weight loss surgery and the importance of managing cravings. - Schedule evaluation including labs - Start diagnostic eating plan - Discuss potential medications for emotional eating (e.g., bupropion , topiramate, naltrexone) - Schedule follow-up visits every two weeks initially, then extend as needed  Prediabetes Prediabetes, which would benefit from weight loss, diet, and exercise. Discussed importance of managing prediabetes to prevent progression to diabetes. Consider metformin if elevated glucose levels are noted, as it can help with hunger signals, decrease inflammation, and reduce the risk of heart attacks. - Reassess prediabetes with labs - Incorporate strategies to manage cravings and emotional eating - Consider metformin if elevated glucose levels are noted  Post-Whipple Procedure Status Status post-Whipple procedure for  pancreatic cancer in 2020. Significant weight loss post-surgery, but weight has been regained. Discussed impact of Whipple procedure on gastric emptying and hunger. Explained that certain foods can help slow gastric emptying to manage hunger. - Monitor weight and nutritional status - Discuss dietary modifications to slow gastric emptying  General Health Maintenance Discussed importance of maintaining overall health and preventing comorbid conditions associated with obesity, such as diabetes and hypertension. - Encourage regular physical activity - Provide anticipatory guidance on healthy eating habits - Schedule regular follow-up visits to monitor progress and adjust treatment as needed  Follow-up - Schedule follow-up visit for evaluation and testing - Ensure patient arrives one hour early for the next visit for testing.         I have personally spent 30 minutes total time today in preparation, patient care, and documentation for this visit, including the following: review of clinical lab tests; review of medical tests/procedures/services.    She was informed of the importance of frequent follow up visits  to maximize her success with intensive lifestyle modifications for her multiple health conditions.    Jasmine Mesi, MD

## 2023-08-24 ENCOUNTER — Ambulatory Visit: Payer: Self-pay | Admitting: Nurse Practitioner

## 2023-08-27 ENCOUNTER — Ambulatory Visit: Payer: Self-pay

## 2023-08-27 NOTE — Telephone Encounter (Signed)
  Chief Complaint: requesting medication for ankle swelling and acid reflux; lasix and protonix  Symptoms: left ankle swelling x 1 month comes and goes since breaking ankle. "Acid reflux" in abdomen. Hx GERD and having "whipple "procedure, has gained some weight back. Frequency: few weeks to 1 month  Pertinent Negatives: Patient denies chest pain no difficulty breathing  Disposition: [] ED /[] Urgent Care (no appt availability in office) / [x] Appointment(In office/virtual)/ []  [facility]/ [] Home Care/ [] Refused Recommended Disposition /[] [facility]/ []  Follow-up with PCP Additional Notes:   Scheduled appt for 08/28/23.   Reason for Disposition . MILD or MODERATE ankle swelling (e.g., can't move joint normally, can't do usual activities) (Exceptions: Itchy, localized swelling; swelling is chronic.)  Answer Assessment - Initial Assessment Questions 1. LOCATION: "Which ankle is swollen?" "Where is the swelling?"     Left ankle hx broken ankle years ago  2. ONSET: "When did the swelling start?"     Few weeks to a month  3. SWELLING: "How bad is the swelling?" Or, "How large is it?" (e.g., mild, moderate, severe; size of localized swelling)    - NONE: No joint swelling.   - LOCALIZED: Localized; small area of puffy or swollen skin (e.g., insect bite, skin irritation).   - MILD: Joint looks or feels mildly swollen or puffy.   - MODERATE: Swollen; interferes with normal activities (e.g., work or school); decreased range of movement; may be limping.   - SEVERE: Very swollen; can't move swollen joint at all; limping a lot or unable to walk.     Mild imprint of shoe noted.  4. PAIN: "Is there any pain?" If Yes, ask: "How bad is it?" (Scale 1-10; or mild, moderate, severe)   - NONE (0): no pain.   - MILD (1-3): doesn't interfere with normal activities.    - MODERATE (4-7): interferes with normal activities (e.g., work or school) or awakens from sleep, limping.    - SEVERE  (8-10): excruciating pain, unable to do any normal activities, unable to walk.      Na  5. CAUSE: "What do you think caused the ankle swelling?"     na 6. OTHER SYMPTOMS: "Do you have any other symptoms?" (e.g., fever, chest pain, difficulty breathing, calf pain)     Feels flutters in chest  at times. C/o acid reflux   7. PREGNANCY: "Is there any chance you are pregnant?" "When was your last menstrual period?"     na  Protocols used: Ankle Swelling-A-AH

## 2023-08-27 NOTE — Telephone Encounter (Signed)
 Patient called, left VM to return the call to the office to speak to NT.   Copied from CRM [id number]. Topic: Clinical - Medication Question >> Aug 27, 2023  1:52 PM Abundio Miu S wrote: Reason for CRM: Patient requesting medication for ankle swelling and acid reflux. Patient states she  was prescribed the medications in the past but has not taken the medication in almost 10 months. Callback # [phone].

## 2023-08-28 ENCOUNTER — Telehealth: Payer: Self-pay | Admitting: Nurse Practitioner

## 2023-08-28 VITALS — Ht 65.0 in | Wt 308.0 lb

## 2023-08-28 DIAGNOSIS — M25472 Effusion, left ankle: Secondary | ICD-10-CM

## 2023-08-28 DIAGNOSIS — K219 Gastro-esophageal reflux disease without esophagitis: Secondary | ICD-10-CM

## 2023-08-28 MED ORDER — PANTOPRAZOLE SODIUM 40 MG PO TBEC: 40.0000 mg | DELAYED_RELEASE_TABLET | Freq: Every day | ORAL | 3 refills | Status: AC

## 2023-08-28 MED ORDER — FUROSEMIDE 20 MG PO TABS: 20.0000 mg | ORAL_TABLET | Freq: Every day | ORAL | 0 refills | Status: DC | PRN

## 2023-08-28 MED ORDER — POTASSIUM CHLORIDE CRYS ER 10 MEQ PO TBCR: EXTENDED_RELEASE_TABLET | ORAL | 0 refills | Status: DC

## 2023-08-28 NOTE — Assessment & Plan Note (Signed)
 We discussed leg elevation, use of compression socks and avoiding salty foods - furosemide (LASIX) 20 MG tablet; Take 1 tablet (20 mg total) by mouth daily as needed.  Dispense: 10 tablet; Refill: 0 - potassium chloride (KLOR-CON M) 10 MEQ tablet; Take 1 tablet  ( ) by mouth as needed on the days you take furosemide  Dispense: 10 tablet; Refill: 0   Follow-up in the office as planned

## 2023-08-28 NOTE — Patient Instructions (Signed)
 For the swelling in your lower extremities, be sure to elevate your legs when able, mind the salt intake, stay physically active and consider wearing compression stockings.    1. Gastroesophageal reflux disease, unspecified whether esophagitis present (Primary)  - pantoprazole (PROTONIX) 40 MG tablet; Take 1 tablet (40 mg total) by mouth daily.  Dispense: 30 tablet; Refill: 3  2. Left ankle swelling  - furosemide (LASIX) 20 MG tablet; Take 1 tablet (20 mg total) by mouth daily as needed.  Dispense: 10 tablet; Refill: 0 - potassium chloride (KLOR-CON M) 10 MEQ tablet; Take 1 tablet  ( ) by mouth as needed on the days you take furosemide  Dispense: 10 tablet; Refill: 0    It is important that you exercise regularly at least 30 minutes 5 times a week as tolerated  Think about what you will eat, plan ahead. Choose " clean, green, fresh or frozen" over canned, processed or packaged foods which are more sugary, salty and fatty. 70 to 75% of food eaten should be vegetables and fruit. Three meals at set times with snacks allowed between meals, but they must be fruit or vegetables. Aim to eat over a 12 hour period , example 7 am to 7 pm, and STOP after  your last meal of the day. Drink water,generally about 64 ounces per day, no other drink is as healthy. Fruit juice is best enjoyed in a healthy way, by EATING the fruit.  Thanks for choosing [facility] we consider it a privelige to serve you.

## 2023-08-28 NOTE — Progress Notes (Signed)
 Virtual Visit via Telephone Note  I connected with Kathryn Stevenson @ on 08/28/23 at  8:20 AM EDT by telephone  and verified that I am speaking with the correct person using two identifiers.  Unable to use video  due to problem with connectivity after multiple attempts.  I spent 11 minutes on this telehealth encounter   Location: Patient: work  Provider: home   I discussed the limitations, risks, security and privacy concerns of performing an evaluation and management service by telephone and the availability of in person appointments. I also discussed with the patient that there may be a patient responsible charge related to this service. The patient expressed understanding and agreed to proceed.   History of Present Illness: [name]  has a past medical history of Acid reflux, Dermatitis, Fracture, ankle, GERD (gastroesophageal reflux disease), Hepatic hemangioma, Hepatitis B, Malignant neoplasm of head of pancreas (HCC), Morbid obesity (HCC), and Pancreatic lesion (06/2018).   Left ankle swelling patient presents with complaints of left ankle swelling for almost a month, stated that she had a surgery to her left ankle in the past and has had to take furosemide as needed for swelling.  She denies shortness of breath, chest pain,  GERD.  Stated that she was on pantoprazole 40 mg daily, needs a prescription for the medication.  She denies nausea, vomiting, diarrhea     Observations/Objective: Patient alert and oriented no sign of distress noted  Assessment and Plan: Gastroesophageal reflux disease Encouraged to avoid fatty fried food, spicy food, caffeinated drinks and other foods that triggers acid reflux symptoms - pantoprazole (PROTONIX) 40 MG tablet; Take 1 tablet (40 mg total) by mouth daily.  Dispense: 30 tablet; Refill: 3    Left ankle swelling We discussed leg elevation, use of compression socks and avoiding salty foods - furosemide (LASIX) 20 MG tablet; Take 1 tablet (20 mg  total) by mouth daily as needed.  Dispense: 10 tablet; Refill: 0 - potassium chloride (KLOR-CON M) 10 MEQ tablet; Take 1 tablet  ( ) by mouth as needed on the days you take furosemide  Dispense: 10 tablet; Refill: 0   Follow-up in the office as planned   Follow Up Instructions:    I discussed the assessment and treatment plan with the patient. The patient was provided an opportunity to ask questions and all were answered. The patient agreed with the plan and demonstrated an understanding of the instructions.   The patient was advised to call back or seek an in-person evaluation if the symptoms worsen or if the condition fails to improve as anticipated.

## 2023-08-28 NOTE — Assessment & Plan Note (Signed)
 Encouraged to avoid fatty fried food, spicy food, caffeinated drinks and other foods that triggers acid reflux symptoms - pantoprazole (PROTONIX) 40 MG tablet; Take 1 tablet (40 mg total) by mouth daily.  Dispense: 30 tablet; Refill: 3

## 2023-09-06 ENCOUNTER — Other Ambulatory Visit: Payer: Self-pay | Admitting: Nurse Practitioner

## 2023-09-06 DIAGNOSIS — M25472 Effusion, left ankle: Secondary | ICD-10-CM

## 2023-09-06 NOTE — Telephone Encounter (Signed)
 Was given for ten days. Please advise >kh

## 2023-09-07 ENCOUNTER — Other Ambulatory Visit: Payer: Self-pay | Admitting: Nurse Practitioner

## 2023-09-07 DIAGNOSIS — M25472 Effusion, left ankle: Secondary | ICD-10-CM

## 2023-09-07 MED ORDER — FUROSEMIDE 20 MG PO TABS
20.0000 mg | ORAL_TABLET | Freq: Every day | ORAL | 0 refills | Status: DC | PRN
Start: 2023-09-07 — End: 2024-02-05

## 2023-09-07 MED ORDER — POTASSIUM CHLORIDE CRYS ER 10 MEQ PO TBCR: EXTENDED_RELEASE_TABLET | ORAL | 0 refills | Status: AC

## 2023-09-28 ENCOUNTER — Encounter: Payer: Self-pay | Admitting: Nurse Practitioner

## 2023-09-28 ENCOUNTER — Ambulatory Visit (INDEPENDENT_AMBULATORY_CARE_PROVIDER_SITE_OTHER): Payer: Self-pay | Admitting: Nurse Practitioner

## 2023-09-28 DIAGNOSIS — Z8507 Personal history of malignant neoplasm of pancreas: Secondary | ICD-10-CM | POA: Diagnosis not present

## 2023-09-28 DIAGNOSIS — Z1329 Encounter for screening for other suspected endocrine disorder: Secondary | ICD-10-CM | POA: Diagnosis not present

## 2023-09-28 DIAGNOSIS — Z13220 Encounter for screening for lipoid disorders: Secondary | ICD-10-CM

## 2023-09-28 DIAGNOSIS — F419 Anxiety disorder, unspecified: Secondary | ICD-10-CM

## 2023-09-28 MED ORDER — HYDROXYZINE HCL 10 MG PO TABS: 10.0000 mg | ORAL_TABLET | Freq: Three times a day (TID) | ORAL | 0 refills | Status: AC | PRN

## 2023-09-28 MED ORDER — BUPROPION HCL ER (SR) 100 MG PO TB12: 100.0000 mg | ORAL_TABLET | Freq: Two times a day (BID) | ORAL | 2 refills | Status: AC

## 2023-09-28 NOTE — Patient Instructions (Signed)
 1. Morbid obesity (HCC) (Primary)  - buPROPion ER (WELLBUTRIN SR) 100 MG 12 hr tablet; Take 1 tablet (100 mg total) by mouth 2 (two) times daily.  Dispense: 60 tablet; Refill: 2 - Amb Referral to Nutrition and Diabetic Education  2. History of pancreatic cancer  - Ambulatory referral to General Surgery  3. Anxiety  - hydrOXYzine (ATARAX) 10 MG tablet; Take 1 tablet (10 mg total) by mouth 3 (three) times daily as needed.  Dispense: 30 tablet; Refill: 0

## 2023-09-28 NOTE — Progress Notes (Signed)
 Subjective   Patient ID: Kathryn Stevenson, female    DOB: 01-26-1976, 48 y.o.   MRN: [id number]  Chief Complaint  Patient presents with   Weight Loss    I need something to help with my weight     Referring provider: Collene Mares, Kathryn Stevenson is a 48 y.o. female with Past Medical History: No date: Acid reflux No date: Dermatitis No date: Fracture, ankle No date: GERD (gastroesophageal reflux disease) No date: Hepatic hemangioma No date: Hepatitis B No date: Malignant neoplasm of head of pancreas (HCC) No date: Morbid obesity (HCC) 06/2018: Pancreatic lesion     Comment:  seen on MR of the liver    HPI  Patient presents today for follow-up visit.  Overall she has been doing well.  She is concerned because it has been 5 years since she had Whipple procedure for pancreatic cancer.  She would like to follow-up with her surgeon.  We will place a referral back to the office.  She states that she has gained a lot of weight and would like something for weight loss.  She states that Wellbutrin has helped in the past.  We will reorder this for her today. Denies f/c/s, n/v/d, hemoptysis, PND, leg swelling Denies chest pain or edema      No Known Allergies  Immunization History  Administered Date(s) Administered   Moderna Sars-Covid-2 Vaccination 09/11/2019, 10/14/2019   Pneumococcal Polysaccharide-23 09/20/2015   Tdap 09/20/2015    Tobacco History: Social History   Tobacco Use  Smoking Status Former   Current packs/day: 0.00   Types: Cigarettes   Quit date: 12/11/2016   Years since quitting: 6.8  Smokeless Tobacco Never   Counseling given: Not Answered   Outpatient Encounter Medications as of 09/28/2023  Medication Sig   buPROPion ER (WELLBUTRIN SR) 100 MG 12 hr tablet Take 1 tablet (100 mg total) by mouth 2 (two) times daily.   furosemide (LASIX) 20 MG tablet Take 1 tablet (20 mg total) by mouth daily as needed.   hydrOXYzine (ATARAX) 10 MG tablet Take  1 tablet (10 mg total) by mouth 3 (three) times daily as needed.   pantoprazole (PROTONIX) 40 MG tablet Take 1 tablet (40 mg total) by mouth daily.   potassium chloride (KLOR-CON M) 10 MEQ tablet Take 1 tablet  ( ) by mouth as needed on the days you take furosemide   No facility-administered encounter medications on file as of 09/28/2023.    Review of Systems  Review of Systems  Constitutional: Negative.   HENT: Negative.    Cardiovascular: Negative.   Gastrointestinal: Negative.   Allergic/Immunologic: Negative.   Neurological: Negative.   Psychiatric/Behavioral: Negative.       Objective:   BP 122/77   Pulse (!) 50   Temp 97.9 F (36.6 C) (Oral)   Wt (!) 321 lb 3.2 oz (145.7 kg)   SpO2 100%   BMI 53.45 kg/m   Wt Readings from Last 5 Encounters:  09/28/23 (!) 321 lb 3.2 oz (145.7 kg)  08/28/23 (!) 308 lb (139.7 kg)  07/30/23 (!) 308 lb (139.7 kg)  07/02/23 (!) 311 lb (141.1 kg)  04/09/23 (!) 309 lb (140.2 kg)     Physical Exam Vitals and nursing note reviewed.  Constitutional:      General: She is not in acute distress.    Appearance: She is well-developed.  Cardiovascular:     Rate and Rhythm: Normal rate and regular rhythm.  Pulmonary:  Effort: Pulmonary effort is normal.     Breath sounds: Normal breath sounds.  Neurological:     Mental Status: She is alert and oriented to person, place, and time.       Assessment & Plan:   Morbid obesity (HCC) -     buPROPion HCl ER (SR); Take 1 tablet (100 mg total) by mouth 2 (two) times daily.  Dispense: 60 tablet; Refill: 2 -     Amb Referral to Nutrition and Diabetic Education -     CBC -     Comprehensive metabolic panel with GFR  History of pancreatic cancer -     Ambulatory referral to General Surgery  Anxiety -     hydrOXYzine HCl; Take 1 tablet (10 mg total) by mouth 3 (three) times daily as needed.  Dispense: 30 tablet; Refill: 0  Thyroid disorder screen -     TSH  Lipid screening -      Lipid panel     Return in about 4 weeks (around 10/26/2023) for weight.   Ivonne Andrew, NP 09/28/2023

## 2023-09-29 LAB — LIPID PANEL

## 2023-09-30 LAB — CBC
Hematocrit: 35.9 % (ref 34.0–46.6)
Hemoglobin: 11.8 g/dL (ref 11.1–15.9)
MCH: 28.5 pg (ref 26.6–33.0)
MCHC: 32.9 g/dL (ref 31.5–35.7)
MCV: 87 fL (ref 79–97)
Platelets: 303 10*3/uL (ref 150–450)
RBC: 4.14 x10E6/uL (ref 3.77–5.28)
RDW: 15.6 % — ABNORMAL HIGH (ref 11.7–15.4)
WBC: 5.4 10*3/uL (ref 3.4–10.8)

## 2023-09-30 LAB — LIPID PANEL
Cholesterol, Total: 215 mg/dL — ABNORMAL HIGH (ref 100–199)
HDL: 94 mg/dL (ref >39)
LDL CALC COMMENT:: 2.3 ratio (ref 0.0–4.4)
LDL Chol Calc (NIH): 106 mg/dL — ABNORMAL HIGH (ref 0–99)
Triglycerides: 88 mg/dL (ref 0–149)
VLDL Cholesterol Cal: 15 mg/dL (ref 5–40)

## 2023-09-30 LAB — TSH: TSH: 1.55 u[IU]/mL (ref 0.450–4.500)

## 2023-09-30 LAB — COMPREHENSIVE METABOLIC PANEL WITH GFR
ALT: 16 IU/L (ref 0–32)
AST: 25 IU/L (ref 0–40)
Albumin: 4.3 g/dL (ref 3.9–4.9)
Alkaline Phosphatase: 99 IU/L (ref 44–121)
BUN/Creatinine Ratio: 14 (ref 9–23)
BUN: 11 mg/dL (ref 6–24)
Bilirubin Total: 0.3 mg/dL (ref 0.0–1.2)
CO2: 18 mmol/L — ABNORMAL LOW (ref 20–29)
Calcium: 9.2 mg/dL (ref 8.7–10.2)
Chloride: 103 mmol/L (ref 96–106)
Creatinine, Ser: 0.78 mg/dL (ref 0.57–1.00)
Globulin, Total: 3 g/dL (ref 1.5–4.5)
Glucose: 85 mg/dL (ref 70–99)
Potassium: 4.5 mmol/L (ref 3.5–5.2)
Sodium: 139 mmol/L (ref 134–144)
Total Protein: 7.3 g/dL (ref 6.0–8.5)
eGFR: 93 mL/min/{1.73_m2} (ref >59)

## 2023-10-01 ENCOUNTER — Telehealth: Payer: Self-pay

## 2023-10-01 NOTE — Telephone Encounter (Signed)
 Sent to [employer]

## 2023-10-30 ENCOUNTER — Other Ambulatory Visit: Payer: Self-pay

## 2023-11-01 ENCOUNTER — Ambulatory Visit: Payer: Self-pay | Admitting: Nurse Practitioner

## 2023-11-08 ENCOUNTER — Other Ambulatory Visit: Payer: Self-pay | Admitting: Nurse Practitioner

## 2023-11-08 ENCOUNTER — Telehealth: Payer: Self-pay

## 2023-11-08 ENCOUNTER — Other Ambulatory Visit: Payer: Self-pay

## 2023-11-08 MED ORDER — ONDANSETRON 4 MG PO TBDP: 4.0000 mg | ORAL_TABLET | Freq: Three times a day (TID) | ORAL | 0 refills | Status: DC | PRN

## 2023-11-08 MED ORDER — ONDANSETRON 4 MG PO TBDP: 4.0000 mg | ORAL_TABLET | Freq: Three times a day (TID) | ORAL | 0 refills | Status: AC | PRN

## 2023-11-14 NOTE — Telephone Encounter (Signed)
 Completed.

## 2023-11-16 ENCOUNTER — Telehealth: Payer: Self-pay | Admitting: Nurse Practitioner

## 2023-11-16 NOTE — Telephone Encounter (Signed)
 Copied from CRM [id number]. Topic: Referral - Request for Referral >> Nov 14, 2023  3:35 PM Star [place] wrote: Did the patient discuss referral with their provider in the last year? Yes (If No - schedule appointment) (If Yes - send message)  Appointment offered? No  Type of order/referral and detailed reason for visit: ultrasound of right breast  Preference of office, provider, location: [facility] if possible  If referral order, have you been seen by this specialty before? No (If Yes, this issue or another issue? When? Where?  Can we respond through MyChart? Yes

## 2023-11-19 ENCOUNTER — Telehealth: Payer: Self-pay

## 2023-11-19 NOTE — Telephone Encounter (Signed)
 Copied from CRM [id number]. Topic: Clinical - Request for Lab/Test Order >> Nov 19, 2023  9:42 AM Alyse July wrote: Reason for CRM: Patient was informed that she needs an U/S completed before her upcoming mammogram appointment. [phone].

## 2023-11-22 ENCOUNTER — Other Ambulatory Visit: Payer: Self-pay

## 2023-11-22 DIAGNOSIS — R928 Other abnormal and inconclusive findings on diagnostic imaging of breast: Secondary | ICD-10-CM

## 2023-11-22 DIAGNOSIS — Z1231 Encounter for screening mammogram for malignant neoplasm of breast: Secondary | ICD-10-CM

## 2023-11-23 ENCOUNTER — Other Ambulatory Visit: Payer: Self-pay | Admitting: Nurse Practitioner

## 2023-11-23 DIAGNOSIS — Z1231 Encounter for screening mammogram for malignant neoplasm of breast: Secondary | ICD-10-CM

## 2023-11-23 NOTE — Telephone Encounter (Signed)
 Done [facility]

## 2023-11-30 ENCOUNTER — Ambulatory Visit
Admission: RE | Admit: 2023-11-30 | Discharge: 2023-11-30 | Disposition: A | Discharge status: Home / Self Care | Source: Ambulatory Visit | Attending: Nurse Practitioner | Admitting: Nurse Practitioner

## 2023-11-30 ENCOUNTER — Ambulatory Visit

## 2023-11-30 DIAGNOSIS — Z1231 Encounter for screening mammogram for malignant neoplasm of breast: Secondary | ICD-10-CM

## 2023-12-10 ENCOUNTER — Encounter

## 2023-12-10 ENCOUNTER — Ambulatory Visit: Admitting: Nurse Practitioner

## 2023-12-10 DIAGNOSIS — Z1231 Encounter for screening mammogram for malignant neoplasm of breast: Secondary | ICD-10-CM

## 2024-01-28 ENCOUNTER — Ambulatory Visit: Payer: Self-pay | Admitting: Nurse Practitioner

## 2024-01-29 ENCOUNTER — Telehealth: Payer: Self-pay

## 2024-01-29 NOTE — Telephone Encounter (Signed)
 Copied from CRM [id number]. Topic: Clinical - Medication Question >> Jan 29, 2024 12:03 PM Fonda T wrote: Reason for CRM: Received call from patient, inquiring if it is ok to purchase GLP1 patches for weight loss.   Patient reports, she has gained weight, and needing  assistance with weight loss.  Patient also reports per previous surgeon office, that performed whipple procedure due to pancreatic cancer, states that this option of patches is safe to take. However patient wanted to discuss with PCP prior to purchasing patches. As they do not need a prescription to purchase.   Can be reached at [phone] to discuss further.

## 2024-02-05 ENCOUNTER — Other Ambulatory Visit: Payer: Self-pay | Admitting: Nurse Practitioner

## 2024-02-05 DIAGNOSIS — M25472 Effusion, left ankle: Secondary | ICD-10-CM

## 2024-02-05 MED ORDER — FUROSEMIDE 20 MG PO TABS: 20.0000 mg | ORAL_TABLET | Freq: Every day | ORAL | 0 refills | Status: DC | PRN

## 2024-02-05 NOTE — Telephone Encounter (Signed)
 Copied from CRM #[id number]. Topic: Clinical - Medication Refill >> Feb 05, 2024 12:03 PM Winona R wrote: Medication: furosemide  (LASIX ) 20 MG tablet  Has the patient contacted their pharmacy? No (Agent: If no, request that the patient contact the pharmacy for the refill. If patient does not wish to contact the pharmacy document the reason why and proceed with request.) (Agent: If yes, when and what did the pharmacy advise?)  This is the patient's preferred pharmacy:  [id number] [place] - [address] AT [facility] [address] [facility] [place] [address] Phone: [phone] Fax: [phone]  Is this the correct pharmacy for this prescription? Yes If no, delete pharmacy and type the correct one.   Has the prescription been filled recently? No  Is the patient out of the medication? Yes  Has the patient been seen for an appointment in the last year OR does the patient have an upcoming appointment? Yes  Can we respond through MyChart? Yes  Agent: Please be advised that Rx refills may take up to 3 business days. We ask that you follow-up with your pharmacy.

## 2024-02-06 ENCOUNTER — Other Ambulatory Visit: Payer: Self-pay | Admitting: Nurse Practitioner

## 2024-02-06 DIAGNOSIS — Z7689 Persons encountering health services in other specified circumstances: Secondary | ICD-10-CM

## 2024-02-08 ENCOUNTER — Telehealth: Payer: Self-pay | Admitting: *Deleted

## 2024-02-08 NOTE — Progress Notes (Signed)
 Care Guide Pharmacy Note  02/08/2024 Name: Jackalynn Art [place] MRN: [id number] DOB: [birth year]  Referred By: Oley Bascom RAMAN, NP Reason for referral: Complex Care Management (Initial outreach to schedule referral with PharmD)   Pearl D Larabee is a 48 y.o. year old female who is a primary care patient of Oley Bascom RAMAN, NP.  Demyah D Yaden was referred to the pharmacist for assistance related to: weight loss   Successful contact was made with the patient to discuss pharmacy services including being ready for the pharmacist to call at least 5 minutes before the scheduled appointment time and to have medication bottles and any blood pressure readings ready for review. The patient agreed to meet with the pharmacist via telephone visit on (date/time).10/7 at 10:30 AM   Harlene Satterfield  [facility], [facility] Guide  Direct Dial: [phone]  Fax [phone]

## 2024-02-08 NOTE — Progress Notes (Signed)
 Care Guide Pharmacy Note  02/08/2024 Name: Kathryn Stevenson MRN: [id number] DOB: 1976/01/05  Referred By: Oley Bascom RAMAN, NP Reason for referral: Complex Care Management (Initial outreach to schedule referral with PharmD)   [name] is a 48 y.o. year old female who is a primary care patient of Oley Bascom RAMAN, NP.  Danica D Andujar was referred to the pharmacist for assistance related to: weight loss COPD  An unsuccessful telephone outreach was attempted today to contact the patient who was referred to the pharmacy team for assistance with medication management. Additional attempts will be made to contact the patient.  Harlene Satterfield  [facility], [facility] Guide  Direct Dial: [phone]  Fax [phone]

## 2024-02-15 ENCOUNTER — Other Ambulatory Visit: Payer: Self-pay | Admitting: Nurse Practitioner

## 2024-02-15 DIAGNOSIS — M25472 Effusion, left ankle: Secondary | ICD-10-CM

## 2024-02-15 NOTE — Telephone Encounter (Signed)
 Was this supposed to be for  short period of time or is it okay for her to have 90 days?

## 2024-03-25 ENCOUNTER — Other Ambulatory Visit: Payer: Self-pay

## 2024-03-25 ENCOUNTER — Telehealth: Payer: Self-pay

## 2024-03-25 ENCOUNTER — Other Ambulatory Visit (HOSPITAL_COMMUNITY): Payer: Self-pay

## 2024-03-25 NOTE — Progress Notes (Signed)
 Contacted patient for pharmacy telephone appt today to discuss weight loss medication. Patient reports she has switched to a primary care office near her home, as she no longer lives in [place]. Educated her on typical coverage requirements to receive GLP-1RA or GLP-1/GIP RA for weight loss. All questions were answered.   Lorain Baseman, PharmD [facility] [phone]

## 2024-03-25 NOTE — Progress Notes (Deleted)
 03/25/2024 Name: Kathryn Stevenson MRN: [id number] DOB: 23-Aug-1975  No chief complaint on file.   Kathryn Stevenson is a 48 y.o. year old female who presented for a telephone visit.   They were referred to the pharmacist by their PCP for assistance in managing weight management. SABRA PMH includes ***   Subjective: Patient was last seen by PCP, Bascom Borer, NP, on 09/28/23. Patient reached out to [facility] on 01/29/24 asking if she could purchase GLP-1 patches for weight loss. She wrote that her surgeon, who performed procedure for pancreatic cancer in the past, had told her it was safe to take. Appears patient saw a provider at [facility] for weight management on 03/04/24.  Today, patient presents in *** good spirits and presents without *** any assistance. ***Patient is accompanied by ***.    Care Team: Primary Care Provider: Borer Bascom RAMAN, NP ; Next Scheduled Visit: *** {careteamprovider:27366}  Medication Access/Adherence  Current Pharmacy:  [facility] #[id number] [facility] [place] [facility] - [address] AT [facility] [address] [place] [address] Phone: [phone] Fax: [phone]   Patient reports affordability concerns with their medications: {YES/NO:21197} Patient reports access/transportation concerns to their pharmacy: {YES/NO:21197} Patient reports adherence concerns with their medications:  {YES/NO:21197} ***  *** Patient denies adherence with medications, reports missing *** medications *** times per week, on average.   Obesity/Overweight, Complicated by ***:  Current medications: Contrave  Weight Management treatments previously prescribed:   Current meal patterns:  - Breakfast: *** - Lunch *** - Supper *** - Snacks *** - Drinks ***  Current physical activity: ***  Current medication access support: ***   Objective:  BP Readings from Last 3 Encounters:  09/28/23 122/77  07/30/23 127/81  07/02/23 (!) 135/91    Lab  Results  Component Value Date   HGBA1C 5.2 11/25/2021   HGBA1C 5.4 04/09/2019   HGBA1C 5.0 01/04/2017       Latest Ref Rng & Units 09/28/2023    3:20 PM 11/24/2021    7:28 AM 03/04/2020    9:25 AM  BMP  Glucose 70 - 99 mg/dL 85  99  897   BUN 6 - 24 mg/dL 11  9  8    Creatinine 0.57 - 1.00 mg/dL 9.21  9.15  9.12   BUN/Creat Ratio 9 - 23 14   9    Sodium 134 - 144 mmol/L 139  138  136   Potassium 3.5 - 5.2 mmol/L 4.5  3.9  3.7   Chloride 96 - 106 mmol/L 103  107  100   CO2 20 - 29 mmol/L 18  23    Calcium 8.7 - 10.2 mg/dL 9.2  8.5  8.9     Lab Results  Component Value Date   CHOL 215 (H) 09/28/2023   HDL 94 09/28/2023   LDLCALC 106 (H) 09/28/2023   TRIG 88 09/28/2023   CHOLHDL 2.3 09/28/2023    Medications Reviewed Today   Medications were not reviewed in this encounter       Assessment/Plan:   Obesity/Overweight: - Currently unable to achieve goal weight loss of 5-10% through diet and lifestyle modifications alone - Extensive dietary counseling including education on focus on lean proteins, fruits and vegetables, whole grains and increased fiber consumption, adequate hydration - Extensive exercise counseling including eventual goal of 150 minutes of moderate intensity exercise weekly - Provided motivational interviewing. Discussed setting non-weight based goals - Recommend to ***   Written  patient instructions provided. Patient verbalized understanding of treatment plan.   Follow Up Plan: *** Pharmacist *** PCP clinic visit in *** Patient seen with ***  Lorain Baseman, PharmD [facility] [phone]
